# Patient Record
Sex: Female | Born: 1965
Health system: Southern US, Community
[De-identification: ages and names within clinical notes are randomized; demographics above are authoritative.]

## PROBLEM LIST (undated history)

## (undated) DIAGNOSIS — R202 Paresthesia of skin: Secondary | ICD-10-CM

## (undated) DIAGNOSIS — R519 Headache, unspecified: Secondary | ICD-10-CM

## (undated) DIAGNOSIS — E669 Obesity, unspecified: Secondary | ICD-10-CM

## (undated) DIAGNOSIS — T4145XA Adverse effect of unspecified anesthetic, initial encounter: Secondary | ICD-10-CM

## (undated) DIAGNOSIS — M1990 Unspecified osteoarthritis, unspecified site: Secondary | ICD-10-CM

## (undated) DIAGNOSIS — I10 Essential (primary) hypertension: Secondary | ICD-10-CM

## (undated) DIAGNOSIS — T8859XA Other complications of anesthesia, initial encounter: Secondary | ICD-10-CM

## (undated) DIAGNOSIS — J45909 Unspecified asthma, uncomplicated: Secondary | ICD-10-CM

## (undated) DIAGNOSIS — M25529 Pain in unspecified elbow: Secondary | ICD-10-CM

## (undated) DIAGNOSIS — R42 Dizziness and giddiness: Secondary | ICD-10-CM

## (undated) DIAGNOSIS — R51 Headache: Secondary | ICD-10-CM

## (undated) DIAGNOSIS — E559 Vitamin D deficiency, unspecified: Secondary | ICD-10-CM

## (undated) DIAGNOSIS — L80 Vitiligo: Secondary | ICD-10-CM

## (undated) DIAGNOSIS — K7290 Hepatic failure, unspecified without coma: Secondary | ICD-10-CM

## (undated) DIAGNOSIS — R112 Nausea with vomiting, unspecified: Secondary | ICD-10-CM

## (undated) DIAGNOSIS — Z972 Presence of dental prosthetic device (complete) (partial): Secondary | ICD-10-CM

## (undated) DIAGNOSIS — Z9889 Other specified postprocedural states: Secondary | ICD-10-CM

## (undated) DIAGNOSIS — F419 Anxiety disorder, unspecified: Secondary | ICD-10-CM

## (undated) DIAGNOSIS — K859 Acute pancreatitis without necrosis or infection, unspecified: Secondary | ICD-10-CM

## (undated) HISTORY — PX: MANDIBLE FRACTURE SURGERY: SHX706

## (undated) HISTORY — DX: Vitiligo: L80

## (undated) HISTORY — DX: Paresthesia of skin: R20.2

## (undated) HISTORY — PX: CHOLECYSTECTOMY: SHX55

## (undated) HISTORY — DX: Essential (primary) hypertension: I10

## (undated) HISTORY — DX: Vitamin D deficiency, unspecified: E55.9

## (undated) HISTORY — DX: Obesity, unspecified: E66.9

## (undated) HISTORY — PX: TRIGGER FINGER RELEASE: SHX641

## (undated) HISTORY — PX: KNEE SURGERY: SHX244

## (undated) HISTORY — DX: Hepatic failure, unspecified without coma: K72.90

## (undated) HISTORY — PX: TONSILLECTOMY: SUR1361

## (undated) HISTORY — PX: ABDOMINAL EXPLORATION SURGERY: SHX538

## (undated) HISTORY — DX: Unspecified asthma, uncomplicated: J45.909

## (undated) HISTORY — DX: Pain in unspecified elbow: M25.529

## (undated) HISTORY — DX: Acute pancreatitis without necrosis or infection, unspecified: K85.90

## (undated) HISTORY — PX: FOOT SURGERY: SHX648

## (undated) HISTORY — PX: NASAL SINUS SURGERY: SHX719

---
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
## 1983-12-13 HISTORY — PX: KNEE ARTHROSCOPY: SUR90

## 1990-12-12 HISTORY — PX: RECONSTRUCTION MANDIBLE / MAXILLA: SUR1083

## 1992-12-12 DIAGNOSIS — K7290 Hepatic failure, unspecified without coma: Secondary | ICD-10-CM

## 1992-12-12 HISTORY — DX: Hepatic failure, unspecified without coma: K72.90

## 2005-02-28 ENCOUNTER — Encounter: Admission: RE | Admit: 2005-02-28 | Discharge: 2005-05-23 | Payer: Self-pay | Admitting: Orthotics

## 2006-06-05 ENCOUNTER — Other Ambulatory Visit: Payer: Self-pay

## 2006-06-09 ENCOUNTER — Ambulatory Visit: Payer: Self-pay | Admitting: Podiatry

## 2009-12-15 ENCOUNTER — Encounter: Admission: RE | Admit: 2009-12-15 | Discharge: 2009-12-15 | Payer: Self-pay | Admitting: Family Medicine

## 2010-04-29 ENCOUNTER — Emergency Department: Payer: Self-pay | Admitting: Emergency Medicine

## 2010-04-30 ENCOUNTER — Emergency Department: Payer: Self-pay

## 2010-09-03 DIAGNOSIS — I10 Essential (primary) hypertension: Secondary | ICD-10-CM | POA: Insufficient documentation

## 2010-09-08 ENCOUNTER — Ambulatory Visit: Payer: Self-pay | Admitting: Unknown Physician Specialty

## 2010-09-09 ENCOUNTER — Inpatient Hospital Stay: Payer: Self-pay | Admitting: Obstetrics and Gynecology

## 2010-12-12 HISTORY — PX: NASAL SINUS SURGERY: SHX719

## 2011-01-05 ENCOUNTER — Ambulatory Visit: Payer: Self-pay | Admitting: Family Medicine

## 2011-02-21 ENCOUNTER — Other Ambulatory Visit: Payer: Self-pay | Admitting: Neurology

## 2011-02-21 DIAGNOSIS — F0781 Postconcussional syndrome: Secondary | ICD-10-CM

## 2011-02-21 DIAGNOSIS — S5400XA Injury of ulnar nerve at forearm level, unspecified arm, initial encounter: Secondary | ICD-10-CM

## 2011-02-21 DIAGNOSIS — M25419 Effusion, unspecified shoulder: Secondary | ICD-10-CM

## 2011-04-01 ENCOUNTER — Ambulatory Visit: Payer: Self-pay | Admitting: Neurology

## 2011-12-13 HISTORY — PX: ABDOMINAL HYSTERECTOMY: SHX81

## 2012-12-12 HISTORY — PX: ABDOMINAL HYSTERECTOMY: SHX81

## 2013-06-19 ENCOUNTER — Ambulatory Visit: Payer: Self-pay | Admitting: Family Medicine

## 2014-04-15 LAB — HM MAMMOGRAPHY: HM Mammogram: NORMAL

## 2014-04-25 LAB — HM PAP SMEAR: HM PAP: NORMAL

## 2014-05-22 LAB — LIPID PANEL
CHOLESTEROL: 168 mg/dL (ref 0–200)
HDL: 71 mg/dL — AB (ref 35–70)
LDL Cholesterol: 84 mg/dL
Triglycerides: 63 mg/dL (ref 40–160)

## 2014-10-08 LAB — HM COLONOSCOPY: HM COLON: NEGATIVE

## 2015-02-16 LAB — HEMOGLOBIN A1C: HEMOGLOBIN A1C: 6.1 % — AB (ref 4.0–6.0)

## 2015-03-31 ENCOUNTER — Encounter: Payer: Self-pay | Admitting: Family Medicine

## 2015-05-19 ENCOUNTER — Ambulatory Visit: Payer: Self-pay | Admitting: Family Medicine

## 2015-06-01 ENCOUNTER — Encounter: Payer: Self-pay | Admitting: Family Medicine

## 2015-06-01 DIAGNOSIS — J45991 Cough variant asthma: Secondary | ICD-10-CM | POA: Insufficient documentation

## 2015-06-01 DIAGNOSIS — E669 Obesity, unspecified: Secondary | ICD-10-CM | POA: Insufficient documentation

## 2015-06-01 DIAGNOSIS — Z780 Asymptomatic menopausal state: Secondary | ICD-10-CM | POA: Insufficient documentation

## 2015-06-01 DIAGNOSIS — L80 Vitiligo: Secondary | ICD-10-CM | POA: Insufficient documentation

## 2015-06-01 DIAGNOSIS — R413 Other amnesia: Secondary | ICD-10-CM | POA: Insufficient documentation

## 2015-06-01 DIAGNOSIS — G5620 Lesion of ulnar nerve, unspecified upper limb: Secondary | ICD-10-CM | POA: Insufficient documentation

## 2015-06-01 DIAGNOSIS — R229 Localized swelling, mass and lump, unspecified: Secondary | ICD-10-CM | POA: Insufficient documentation

## 2015-06-02 ENCOUNTER — Encounter: Payer: Self-pay | Admitting: Family Medicine

## 2015-06-04 ENCOUNTER — Encounter: Payer: Self-pay | Admitting: Family Medicine

## 2015-06-04 ENCOUNTER — Ambulatory Visit (INDEPENDENT_AMBULATORY_CARE_PROVIDER_SITE_OTHER): Payer: 59 | Admitting: Family Medicine

## 2015-06-04 VITALS — BP 126/86 | HR 102 | Temp 98.0°F | Resp 16 | Ht 64.0 in | Wt 192.1 lb

## 2015-06-04 DIAGNOSIS — E669 Obesity, unspecified: Secondary | ICD-10-CM

## 2015-06-04 DIAGNOSIS — I10 Essential (primary) hypertension: Secondary | ICD-10-CM

## 2015-06-04 DIAGNOSIS — J45909 Unspecified asthma, uncomplicated: Secondary | ICD-10-CM

## 2015-06-04 DIAGNOSIS — J45998 Other asthma: Secondary | ICD-10-CM | POA: Diagnosis not present

## 2015-06-04 MED ORDER — QSYMIA 11.25-69 MG PO CP24: 1.0000 | ORAL_CAPSULE | Freq: Every day | ORAL | Status: DC

## 2015-06-04 MED ORDER — TRIBENZOR 20-5-12.5 MG PO TABS: 1.0000 | ORAL_TABLET | Freq: Every day | ORAL | Status: DC

## 2015-06-04 NOTE — Progress Notes (Signed)
Name: Tammy Swanson   MRN: [id number]    DOB: 10-Apr-1966   Date:06/04/2015       Progress Note  Subjective  Chief Complaint  Chief Complaint  Patient presents with  . Obesity    dry mouth   . Hypertension    headaches  . Asthma    wheather,humidity    HPI  HTN: she has been taking medication and bp is at goal, denies side effects of medication.  Obesity : started on Qsymia June 2015, the weight was 210.19lbs, she is doing well on medication has lost over 5% of original weight and is still losing, it curbs her appetite, only side effect is dry mouth but tolerating it well otherwise and wants to continue on medication  Asthma: using Qvar lately because of increase in humidity, some cough at night but less than twice weekly, no wheezing, not using rescue inhaler lately.   Patient Active Problem List   Diagnosis Date Noted  . Bad memory 06/01/2015  . Menopause 06/01/2015  . Asthma, mild 06/01/2015  . Obesity (BMI 30-39.9) 06/01/2015  . Nodule, subcutaneous 06/01/2015  . Lesion of ulnar nerve 06/01/2015  . Vitiligo 06/01/2015  . Benign hypertension 09/03/2010    Past Surgical History  Procedure Laterality Date  . Abdominal hysterectomy  2013  . Knee surgery Left   . Foot surgery    . Mandible fracture surgery    . Tonsillectomy    . Nasal sinus surgery    . Trigger finger release    . Cholecystectomy      Family History  Problem Relation Age of Onset  . Hypertension Mother   . HIV/AIDS Mother   . Cancer Father   . Hypertension Sister     History   Social History  . Marital Status: Married    Spouse Name: N/A  . Number of Children: N/A  . Years of Education: N/A   Occupational History  . Not on file.   Social History Main Topics  . Smoking status: Never Smoker   . Smokeless tobacco: Never Used  . Alcohol Use: No  . Drug Use: No  . Sexual Activity: Yes   Other Topics Concern  . Not on file   Social History Narrative  . No narrative on file      Current outpatient prescriptions:  .  albuterol (PROAIR HFA) 108 (90 BASE) MCG/ACT inhaler, Inhale 2 puffs into the lungs every 6 (six) hours as needed., Disp: , Rfl:  .  beclomethasone (QVAR) 40 MCG/ACT inhaler, Inhale 1 puff into the lungs as needed., Disp: , Rfl:  .  meloxicam (MOBIC) 15 MG tablet, Take 1 tablet by mouth as needed., Disp: , Rfl:  .  montelukast (SINGULAIR) 10 MG tablet, Take 1 tablet by mouth daily., Disp: , Rfl:  .  QSYMIA 11.25-69 MG CP24, Take 1 tablet by mouth daily., Disp: 30 capsule, Rfl: 2 .  tacrolimus (PROTOPIC) 0.1 % ointment, Apply 1 application topically daily., Disp: , Rfl:  .  TRIBENZOR 20-5-12.5 MG TABS, Take 1 tablet by mouth daily., Disp: 90 tablet, Rfl: 1  Allergies  Allergen Reactions  . Latex Anaphylaxis  . Hydrocodone-Acetaminophen     hives     ROS  Constitutional: Negative for fever losing weight as desired Respiratory: Negative for cough and shortness of breath.   Cardiovascular: Negative for chest pain or palpitations.  Gastrointestinal: Negative for abdominal pain, no bowel changes.  Musculoskeletal: Negative for gait problem or joint swelling.  Skin: Negative for rash.  Neurological: Negative for dizziness or headache.  No other specific complaints in a complete review of systems (except as listed in HPI above).  Objective  Filed Vitals:   06/04/15 1111  BP: 126/86  Pulse: 102  Temp: 98 F (36.7 C)  TempSrc: Oral  Resp: 16  Height: 5\' 4"  (1.626 m)  Weight: 192 lb 1.6 oz (87.136 kg)  SpO2: 99%    Body mass index is 32.96 kg/(m^2).  Physical Exam   Constitutional: Patient appears well-developed and well-nourished. No distress.  HENT: Head: Normocephalic and atraumatic.  Nose: Nose normal. Mouth/Throat: Oropharynx is clear and moist. No oropharyngeal exudate.  Eyes: Conjunctivae and EOM are normal. Pupils are equal, round, and reactive to light. No scleral icterus.  Neck: Normal range of motion. Neck supple. No JVD  present. No thyromegaly present.  Cardiovascular: Normal rate, regular rhythm and normal heart sounds.  No murmur heard. No BLE edema. Pulmonary/Chest: Effort normal and breath sounds normal. No respiratory distress. Abdominal: Soft. Bowel sounds are normal, no distension. There is no tenderness. no masses Musculoskeletal: Normal range of motion, no joint effusions. No gross deformities Neurological: he is alert and oriented to person, place, and time. No cranial nerve deficit. Coordination, balance, strength, speech and gait are normal.  Skin: Skin is warm and dry. No rash noted. No erythema.  Psychiatric: Patient has a normal mood and affect. behavior is normal. Judgment and thought content normal. PHQ2/9: Depression screen PHQ 2/9 06/04/2015  Decreased Interest 0  Down, Depressed, Hopeless 0  PHQ - 2 Score 0     Fall Risk: Fall Risk  06/04/2015  Falls in the past year? Yes  Number falls in past yr: 1  Injury with Fall? No    Assessment & Plan  1. Benign hypertension At goal, continue medication  - TRIBENZOR 20-5-12.5 MG TABS; Take 1 tablet by mouth daily.  Dispense: 90 tablet; Refill: 1  2. Asthma, mild States that she has noticed using Qvar more often because of the heat and humidity.   3. Obesity (BMI 30-39.9) Doing well, lost over 5% of her weight over the past year. Still losing weight on medication - QSYMIA 11.25-69 MG CP24; Take 1 tablet by mouth daily.  Dispense: 30 capsule; Refill: 2

## 2015-08-04 ENCOUNTER — Ambulatory Visit: Payer: 59 | Admitting: Family Medicine

## 2015-09-10 ENCOUNTER — Ambulatory Visit (INDEPENDENT_AMBULATORY_CARE_PROVIDER_SITE_OTHER): Payer: Managed Care, Other (non HMO) | Admitting: Family Medicine

## 2015-09-10 ENCOUNTER — Encounter: Payer: Self-pay | Admitting: Family Medicine

## 2015-09-10 VITALS — BP 122/88 | HR 97 | Temp 97.7°F | Resp 14 | Ht 64.0 in | Wt 195.4 lb

## 2015-09-10 DIAGNOSIS — L309 Dermatitis, unspecified: Secondary | ICD-10-CM | POA: Diagnosis not present

## 2015-09-10 DIAGNOSIS — R42 Dizziness and giddiness: Secondary | ICD-10-CM | POA: Diagnosis not present

## 2015-09-10 DIAGNOSIS — E669 Obesity, unspecified: Secondary | ICD-10-CM | POA: Diagnosis not present

## 2015-09-10 DIAGNOSIS — J45991 Cough variant asthma: Secondary | ICD-10-CM | POA: Diagnosis not present

## 2015-09-10 DIAGNOSIS — Z13220 Encounter for screening for lipoid disorders: Secondary | ICD-10-CM | POA: Diagnosis not present

## 2015-09-10 DIAGNOSIS — G43009 Migraine without aura, not intractable, without status migrainosus: Secondary | ICD-10-CM

## 2015-09-10 DIAGNOSIS — Z23 Encounter for immunization: Secondary | ICD-10-CM | POA: Diagnosis not present

## 2015-09-10 DIAGNOSIS — I10 Essential (primary) hypertension: Secondary | ICD-10-CM

## 2015-09-10 MED ORDER — TACROLIMUS 0.1 % EX OINT: 1.0000 "application " | TOPICAL_OINTMENT | Freq: Every day | CUTANEOUS | Status: DC

## 2015-09-10 MED ORDER — PHENTERMINE-TOPIRAMATE ER 7.5-46 MG PO CP24: 30.0000 | ORAL_CAPSULE | Freq: Every day | ORAL | Status: DC

## 2015-09-10 MED ORDER — AMITRIPTYLINE HCL 25 MG PO TABS: 25.0000 mg | ORAL_TABLET | Freq: Every day | ORAL | Status: DC

## 2015-09-10 MED ORDER — BECLOMETHASONE DIPROPIONATE 40 MCG/ACT IN AERS: 1.0000 | INHALATION_SPRAY | RESPIRATORY_TRACT | Status: DC | PRN

## 2015-09-10 MED ORDER — TRIBENZOR 20-5-12.5 MG PO TABS: 1.0000 | ORAL_TABLET | Freq: Every day | ORAL | Status: DC

## 2015-09-10 NOTE — Progress Notes (Signed)
Name: Tammy Swanson   MRN: [id number]    DOB: 04-30-1966   Date:09/10/2015       Progress Note  Subjective  Chief Complaint  Chief Complaint  Patient presents with  . Medication Refill    follow-up  . Hypertension    headaches and dizziness onset 1 week ago with nausea  . Obesity    needs refill on Qysmia has been out since husband switched to new ins.  . Asthma    HPI  HTN: she has been taking medication and bp is at goal, denies side effects of medication. No chest pain or palpitation   Obesity : started on Qsymia June 2015, the weight was 210.19lbs, she is doing well on medication has lost over 5% of original weight  it curbs her appetite, only side effect is dry mouth. Her husband changed jobs and switched insurance, she ran out of Qysmia because of that 3 weeks ago, but wants to resume medication, weight is up by only 3 lbs.   Asthma: symptoms are now improved with Qvar daily, used to cough multiple times daily, but currently less frequent, occasional SOB but no wheezing   09/10/15 1609  Asthma History  Symptoms >2 days/week  Nighttime Awakenings 0-2/month  Asthma interference with normal activity No limitations  SABA use (not for EIB) 0-2 days/wk  Risk: Exacerbations requiring oral systemic steroids 0-1 / year  Asthma Severity Moderate Persistent    Migraine headaches: she has a history of migraines, and is under a lot of stress lately. Husband switched jobs and works third shift now, daughter is not doing well emotionally  She is also under stress at work . Last week at work she developed dizziness, nausea and headache that lasted over one day. She states having episodes every couple of days.  Pain is described as throbbing, it can be a 7/10 and feels like pulling her head off would make her feel better. She states improves when she takes a nap   Patient Active Problem List   Diagnosis Date Noted  . Migraine without aura and without status migrainosus, not  intractable 09/10/2015  . Eczema 09/10/2015  . Bad memory 06/01/2015  . Menopause 06/01/2015  . Cough variant asthma 06/01/2015  . Obesity (BMI 30-39.9) 06/01/2015  . Nodule, subcutaneous 06/01/2015  . Lesion of ulnar nerve 06/01/2015  . Vitiligo 06/01/2015  . Benign hypertension 09/03/2010    Past Surgical History  Procedure Laterality Date  . Abdominal hysterectomy  2013  . Knee surgery Left   . Foot surgery    . Mandible fracture surgery    . Tonsillectomy    . Nasal sinus surgery    . Trigger finger release    . Cholecystectomy      Family History  Problem Relation Age of Onset  . Hypertension Mother   . HIV/AIDS Mother   . Cancer Father   . Hypertension Sister     Social History   Social History  . Marital Status: Married    Spouse Name: N/A  . Number of Children: N/A  . Years of Education: N/A   Occupational History  . Not on file.   Social History Main Topics  . Smoking status: Never Smoker   . Smokeless tobacco: Never Used  . Alcohol Use: No  . Drug Use: No  . Sexual Activity: Yes   Other Topics Concern  . Not on file   Social History Narrative     Current outpatient prescriptions:  .  albuterol (PROAIR HFA) 108 (90 BASE) MCG/ACT inhaler, Inhale 2 puffs into the lungs every 6 (six) hours as needed., Disp: , Rfl:  .  beclomethasone (QVAR) 40 MCG/ACT inhaler, Inhale 1 puff into the lungs as needed., Disp: 3 Inhaler, Rfl: 1 .  montelukast (SINGULAIR) 10 MG tablet, Take 1 tablet by mouth daily., Disp: , Rfl:  .  tacrolimus (PROTOPIC) 0.1 % ointment, Apply 1 application topically daily., Disp: 300 g, Rfl: 1 .  TRIBENZOR 20-5-12.5 MG TABS, Take 1 tablet by mouth daily., Disp: 90 tablet, Rfl: 1 .  amitriptyline (ELAVIL) 25 MG tablet, Take 1 tablet (25 mg total) by mouth at bedtime., Disp: 30 tablet, Rfl: 0 .  Phentermine-Topiramate (QSYMIA) 7.5-46 MG CP24, Take 30 tablets by mouth daily., Disp: 30 capsule, Rfl: 0  Allergies  Allergen Reactions  .  Latex Anaphylaxis  . Hydrocodone-Acetaminophen     hives     ROS  Constitutional: Negative for fever or weight change.  Respiratory: Negative for cough and shortness of breath.   Cardiovascular: Negative for chest pain or palpitations.  Gastrointestinal: Negative for abdominal pain, no bowel changes.  Musculoskeletal: Negative for gait problem or joint swelling.  Skin: Negative for rash.  Neurological: Positive  for dizziness and  headache.  No other specific complaints in a complete review of systems (except as listed in HPI above).  Objective  Filed Vitals:   09/10/15 1540  BP: 122/88  Pulse: 97  Temp: 97.7 F (36.5 C)  TempSrc: Oral  Resp: 14  Height: 5\' 4"  (1.626 m)  Weight: 195 lb 6.4 oz (88.633 kg)  SpO2: 97%    Body mass index is 33.52 kg/(m^2).  Physical Exam  Constitutional: Patient appears well-developed and well-nourished. Obese No distress.  HEENT: head atraumatic, normocephalic, pupils equal and reactive to light, neck supple, throat within normal limits Cardiovascular: Normal rate, regular rhythm and normal heart sounds.  No murmur heard. No BLE edema. Pulmonary/Chest: Effort normal and breath sounds normal. No respiratory distress. Abdominal: Soft.  There is no tenderness. Psychiatric: Patient has a normal mood and affect. behavior is normal. Judgment and thought content normal. Neurological: normal exam today, no focal finding.   PHQ2/9: Depression screen [facility] 2/9 09/10/2015 06/04/2015  Decreased Interest 0 0  Down, Depressed, Hopeless 0 0  PHQ - 2 Score 0 0     Fall Risk: Fall Risk  09/10/2015 06/04/2015  Falls in the past year? No Yes  Number falls in past yr: - 1  Injury with Fall? - No      Functional Status Survey: Is the patient deaf or have difficulty hearing?: No Does the patient have difficulty seeing, even when wearing glasses/contacts?: Yes (glasses) Does the patient have difficulty concentrating, remembering, or making decisions?:  No Does the patient have difficulty walking or climbing stairs?: No Does the patient have difficulty dressing or bathing?: No Does the patient have difficulty doing errands alone such as visiting a doctor's office or shopping?: No   Assessment & Plan  1. Benign hypertension  At goal, continue medication  - Comprehensive metabolic panel - Thyroid Panel With TSH - TRIBENZOR 20-5-12.5 MG TABS; Take 1 tablet by mouth daily.  Dispense: 90 tablet; Refill: 1  2. Needs flu shot  - Flu Vaccine QUAD 36+ mos PF IM (Fluarix & Fluzone Quad PF)  3. Cough variant asthma  Doing better on medication  - beclomethasone (QVAR) 40 MCG/ACT inhaler; Inhale 1 puff into the lungs as needed.  Dispense: 3 Inhaler; Refill: 1  4. Obesity (BMI 30-39.9)  We will resume lower dose of Qysmia, and follow up in one month - Phentermine-Topiramate (QSYMIA) 7.5-46 MG CP24; Take 30 tablets by mouth daily.  Dispense: 30 capsule; Refill: 0  5. Dizziness   likely from Migraine, but she also states gets worse with movement of head, if not better refer to ENT - CBC with Differential/Platelet  6. Lipid screening  - Lipid panel  7. Migraine without aura and without status migrainosus, not intractable  - amitriptyline (ELAVIL) 25 MG tablet; Take 1 tablet (25 mg total) by mouth at bedtime.  Dispense: 30 tablet; Refill: 0  8. Eczema  - tacrolimus (PROTOPIC) 0.1 % ointment; Apply 1 application topically daily.  Dispense: 300 g; Refill: 1

## 2015-09-10 NOTE — Progress Notes (Signed)
   09/10/15 1609  Asthma History  Symptoms >2 days/week  Nighttime Awakenings 0-2/month  Asthma interference with normal activity No limitations  SABA use (not for EIB) 0-2 days/wk  Risk: Exacerbations requiring oral systemic steroids 0-1 / year  Asthma Severity Moderate Persistent

## 2015-09-11 LAB — CBC WITH DIFFERENTIAL/PLATELET
BASOS ABS: 0 10*3/uL (ref 0.0–0.2)
Basos: 0 %
EOS (ABSOLUTE): 0.2 10*3/uL (ref 0.0–0.4)
Eos: 2 %
Hematocrit: 42.4 % (ref 34.0–46.6)
Hemoglobin: 14.3 g/dL (ref 11.1–15.9)
IMMATURE GRANS (ABS): 0 10*3/uL (ref 0.0–0.1)
IMMATURE GRANULOCYTES: 0 %
LYMPHS: 20 %
Lymphocytes Absolute: 1.5 10*3/uL (ref 0.7–3.1)
MCH: 27.6 pg (ref 26.6–33.0)
MCHC: 33.7 g/dL (ref 31.5–35.7)
MCV: 82 fL (ref 79–97)
Monocytes Absolute: 0.6 10*3/uL (ref 0.1–0.9)
Monocytes: 8 %
NEUTROS PCT: 70 %
Neutrophils Absolute: 5 10*3/uL (ref 1.4–7.0)
PLATELETS: 349 10*3/uL (ref 150–379)
RBC: 5.18 x10E6/uL (ref 3.77–5.28)
RDW: 13.5 % (ref 12.3–15.4)
WBC: 7.3 10*3/uL (ref 3.4–10.8)

## 2015-09-12 LAB — COMPREHENSIVE METABOLIC PANEL
A/G RATIO: 1.3 (ref 1.1–2.5)
ALT: 21 IU/L (ref 0–32)
AST: 19 IU/L (ref 0–40)
Albumin: 4.3 g/dL (ref 3.5–5.5)
Alkaline Phosphatase: 101 IU/L (ref 39–117)
BILIRUBIN TOTAL: 0.4 mg/dL (ref 0.0–1.2)
BUN/Creatinine Ratio: 12 (ref 9–23)
BUN: 11 mg/dL (ref 6–24)
CALCIUM: 10 mg/dL (ref 8.7–10.2)
CHLORIDE: 96 mmol/L — AB (ref 97–108)
CO2: 25 mmol/L (ref 18–29)
Creatinine, Ser: 0.92 mg/dL (ref 0.57–1.00)
GFR calc Af Amer: 83 mL/min/{1.73_m2} (ref >59)
GFR, EST NON AFRICAN AMERICAN: 72 mL/min/{1.73_m2} (ref >59)
GLOBULIN, TOTAL: 3.3 g/dL (ref 1.5–4.5)
Glucose: 96 mg/dL (ref 65–99)
POTASSIUM: 4.9 mmol/L (ref 3.5–5.2)
SODIUM: 137 mmol/L (ref 134–144)
Total Protein: 7.6 g/dL (ref 6.0–8.5)

## 2015-09-12 LAB — LIPID PANEL
CHOL/HDL RATIO: 2.4 ratio (ref 0.0–4.4)
Cholesterol, Total: 163 mg/dL (ref 100–199)
HDL: 67 mg/dL (ref >39)
LDL Calculated: 82 mg/dL (ref 0–99)
TRIGLYCERIDES: 72 mg/dL (ref 0–149)
VLDL Cholesterol Cal: 14 mg/dL (ref 5–40)

## 2015-09-12 LAB — THYROID PANEL WITH TSH
FREE THYROXINE INDEX: 1.9 (ref 1.2–4.9)
T3 UPTAKE RATIO: 30 % (ref 24–39)
T4, Total: 6.3 ug/dL (ref 4.5–12.0)
TSH: 1.8 u[IU]/mL (ref 0.450–4.500)

## 2015-09-15 NOTE — Progress Notes (Signed)
Pt.notified

## 2015-10-09 ENCOUNTER — Encounter: Payer: Self-pay | Admitting: Family Medicine

## 2015-10-09 ENCOUNTER — Ambulatory Visit (INDEPENDENT_AMBULATORY_CARE_PROVIDER_SITE_OTHER): Payer: Managed Care, Other (non HMO) | Admitting: Family Medicine

## 2015-10-09 VITALS — BP 138/88 | HR 88 | Temp 98.2°F | Resp 16 | Ht 64.0 in | Wt 200.3 lb

## 2015-10-09 DIAGNOSIS — G43109 Migraine with aura, not intractable, without status migrainosus: Secondary | ICD-10-CM

## 2015-10-09 DIAGNOSIS — E669 Obesity, unspecified: Secondary | ICD-10-CM

## 2015-10-09 MED ORDER — SUMATRIPTAN SUCCINATE 100 MG PO TABS: 100.0000 mg | ORAL_TABLET | ORAL | Status: DC | PRN

## 2015-10-09 MED ORDER — AMITRIPTYLINE HCL 10 MG PO TABS: 10.0000 mg | ORAL_TABLET | Freq: Every day | ORAL | Status: DC

## 2015-10-09 NOTE — Progress Notes (Signed)
Name: Tammy Swanson   MRN: [id number]    DOB: 1966/07/27   Date:10/09/2015       Progress Note  Subjective  Chief Complaint  Chief Complaint  Patient presents with  . Follow-up    1 month f/u  . Migraine    States the amitriptylin kept her in a fog and has stopped  . Obesity    never started due to ins not covering.  Needs prior auth. will talk to shelly    HPI      Migraine headaches: she was seen about one month ago because of symptoms of migraine. Described dizziness, headaches  . We started her on Elavil 25 mg but she felt very groggy the following day, but not headaches occurred while on the medication. She had to stop medication because of side effects, and had two episode since she stopped taking it.  She has aura, she states she noticed that she develops a flushing sensation that goes from chest to her face, followed by dizziness and about 45 minutes later pounding, throbbing headache associated with nausea.   Obesity: insurance denied Qsymia, waiting on PA  Patient Active Problem List   Diagnosis Date Noted  . Migraine without aura and without status migrainosus, not intractable 09/10/2015  . Eczema 09/10/2015  . Bad memory 06/01/2015  . Menopause 06/01/2015  . Cough variant asthma 06/01/2015  . Obesity (BMI 30-39.9) 06/01/2015  . Nodule, subcutaneous 06/01/2015  . Lesion of ulnar nerve 06/01/2015  . Vitiligo 06/01/2015  . Benign hypertension 09/03/2010    Past Surgical History  Procedure Laterality Date  . Abdominal hysterectomy  2013  . Knee surgery Left   . Foot surgery    . Mandible fracture surgery    . Tonsillectomy    . Nasal sinus surgery    . Trigger finger release    . Cholecystectomy      Family History  Problem Relation Age of Onset  . Hypertension Mother   . HIV/AIDS Mother   . Cancer Father   . Hypertension Sister     Social History   Social History  . Marital Status: Married    Spouse Name: N/A  . Number of Children: N/A   . Years of Education: N/A   Occupational History  . Not on file.   Social History Main Topics  . Smoking status: Never Smoker   . Smokeless tobacco: Never Used  . Alcohol Use: No  . Drug Use: No  . Sexual Activity: Yes   Other Topics Concern  . Not on file   Social History Narrative     Current outpatient prescriptions:  .  albuterol (PROAIR HFA) 108 (90 BASE) MCG/ACT inhaler, Inhale 2 puffs into the lungs every 6 (six) hours as needed., Disp: , Rfl:  .  beclomethasone (QVAR) 40 MCG/ACT inhaler, Inhale 1 puff into the lungs as needed., Disp: 3 Inhaler, Rfl: 1 .  montelukast (SINGULAIR) 10 MG tablet, Take 1 tablet by mouth daily., Disp: , Rfl:  .  Phentermine-Topiramate (QSYMIA) 7.5-46 MG CP24, Take 30 tablets by mouth daily., Disp: 30 capsule, Rfl: 0 .  tacrolimus (PROTOPIC) 0.1 % ointment, Apply 1 application topically daily., Disp: 300 g, Rfl: 1 .  TRIBENZOR 20-5-12.5 MG TABS, Take 1 tablet by mouth daily., Disp: 90 tablet, Rfl: 1  Allergies  Allergen Reactions  . Latex Anaphylaxis  . Hydrocodone-Acetaminophen     hives     ROS  Ten systems reviewed and is negative except as  mentioned in HPI   Objective  Filed Vitals:   10/09/15 1611  BP: 138/88  Pulse: 88  Temp: 98.2 F (36.8 C)  TempSrc: Oral  Resp: 16  Height: 5\' 4"  (1.626 m)  Weight: 200 lb 4.8 oz (90.855 kg)  SpO2: 96%    Body mass index is 34.36 kg/(m^2).  Physical Exam  Constitutional: Patient appears well-developed and well-nourished. Obese  No distress.  HEENT: head atraumatic, normocephalic, pupils equal and reactive to light, eneck supple, throat within normal limits Cardiovascular: Normal rate, regular rhythm and normal heart sounds.  No murmur heard. No BLE edema. Pulmonary/Chest: Effort normal and breath sounds normal. No respiratory distress. Abdominal: Soft.  There is no tenderness. Psychiatric: Patient has a normal mood and affect. behavior is normal. Judgment and thought content  normal. Neurological: normal , AAA, normal sensation   Recent Results (from the past 2160 hour(s))  CBC with Differential/Platelet     Status: None   Collection Time: 09/11/15  9:11 AM  Result Value Ref Range   WBC 7.3 3.4 - 10.8 x10E3/uL   RBC 5.18 3.77 - 5.28 x10E6/uL   Hemoglobin 14.3 11.1 - 15.9 g/dL   Hematocrit 42.4 34.0 - 46.6 %   MCV 82 79 - 97 fL   MCH 27.6 26.6 - 33.0 pg   MCHC 33.7 31.5 - 35.7 g/dL   RDW 13.5 12.3 - 15.4 %   Platelets 349 150 - 379 x10E3/uL   Neutrophils 70 %   Lymphs 20 %   Monocytes 8 %   Eos 2 %   Basos 0 %   Neutrophils Absolute 5.0 1.4 - 7.0 x10E3/uL   Lymphocytes Absolute 1.5 0.7 - 3.1 x10E3/uL   Monocytes Absolute 0.6 0.1 - 0.9 x10E3/uL   EOS (ABSOLUTE) 0.2 0.0 - 0.4 x10E3/uL   Basophils Absolute 0.0 0.0 - 0.2 x10E3/uL   Immature Granulocytes 0 %   Immature Grans (Abs) 0.0 0.0 - 0.1 x10E3/uL  Comprehensive metabolic panel     Status: Abnormal   Collection Time: 09/11/15  9:11 AM  Result Value Ref Range   Glucose 96 65 - 99 mg/dL   BUN 11 6 - 24 mg/dL   Creatinine, Ser 0.92 0.57 - 1.00 mg/dL   GFR calc non Af Amer 72 >59 mL/min/1.73   GFR calc Af Amer 83 >59 mL/min/1.73   BUN/Creatinine Ratio 12 9 - 23   Sodium 137 134 - 144 mmol/L   Potassium 4.9 3.5 - 5.2 mmol/L   Chloride 96 (L) 97 - 108 mmol/L   CO2 25 18 - 29 mmol/L   Calcium 10.0 8.7 - 10.2 mg/dL   Total Protein 7.6 6.0 - 8.5 g/dL   Albumin 4.3 3.5 - 5.5 g/dL   Globulin, Total 3.3 1.5 - 4.5 g/dL   Albumin/Globulin Ratio 1.3 1.1 - 2.5   Bilirubin Total 0.4 0.0 - 1.2 mg/dL   Alkaline Phosphatase 101 39 - 117 IU/L   AST 19 0 - 40 IU/L   ALT 21 0 - 32 IU/L  Lipid panel     Status: None   Collection Time: 09/11/15  9:11 AM  Result Value Ref Range   Cholesterol, Total 163 100 - 199 mg/dL   Triglycerides 72 0 - 149 mg/dL   HDL 67 >39 mg/dL    Comment: According to ATP-III Guidelines, HDL-C >59 mg/dL is considered a negative risk factor for CHD.    VLDL Cholesterol Cal 14 5 -  40 mg/dL   LDL Calculated 82 0 -  99 mg/dL   Chol/HDL Ratio 2.4 0.0 - 4.4 ratio units    Comment:                                   T. Chol/HDL Ratio                                             Men  Women                               1/2 Avg.Risk  3.4    3.3                                   Avg.Risk  5.0    4.4                                2X Avg.Risk  9.6    7.1                                3X Avg.Risk 23.4   11.0   Thyroid Panel With TSH     Status: None   Collection Time: 09/11/15  9:11 AM  Result Value Ref Range   TSH 1.800 0.450 - 4.500 uIU/mL   T4, Total 6.3 4.5 - 12.0 ug/dL   T3 Uptake Ratio 30 24 - 39 %   Free Thyroxine Index 1.9 1.2 - 4.9    PHQ2/9: Depression screen [facility] 2/9 09/10/2015 06/04/2015  Decreased Interest 0 0  Down, Depressed, Hopeless 0 0  PHQ - 2 Score 0 0     Fall Risk: Fall Risk  09/10/2015 06/04/2015  Falls in the past year? No Yes  Number falls in past yr: - 1  Injury with Fall? - No     Assessment & Plan   1. Migraine with aura and without status migrainosus, not intractable  We will decrease dose of Elavil and take it earlier in the evening, also explained that since she has aura, she needs to take Imitrex at onset of aura to abort the migraine episode.  - SUMAtriptan (IMITREX) 100 MG tablet; Take 1 tablet (100 mg total) by mouth every 2 (two) hours as needed for migraine. May repeat in 2 hours if headache persists or recurs.  Dispense: 10 tablet; Refill: 0 - amitriptyline (ELAVIL) 10 MG tablet; Take 1 tablet (10 mg total) by mouth at bedtime.  Dispense: 30 tablet; Refill: 0  2. Obesity (BMI 30-39.9)   pending PA approval

## 2015-10-14 ENCOUNTER — Telehealth: Payer: Self-pay

## 2015-10-14 DIAGNOSIS — E669 Obesity, unspecified: Secondary | ICD-10-CM

## 2015-10-14 NOTE — Telephone Encounter (Signed)
Tried  Prior auth for [employer] her insurance will not cover she must first try all of the following: contrave,belviq,saxenda.  Please write for one of these

## 2015-10-14 NOTE — Telephone Encounter (Signed)
She has been on Qsymia for years, I have not discussed other options with her. Please call her tomorrow and I can discuss options with her over the phone before sending a new prescription, since it was denied by insurance

## 2015-10-15 MED ORDER — LORCASERIN HCL 10 MG PO TABS: 1.0000 | ORAL_TABLET | Freq: Two times a day (BID) | ORAL | Status: DC

## 2015-10-15 NOTE — Telephone Encounter (Signed)
Discussed all other optiosns for weight loss medications including Belviq,  Saxenda and Contrave. Discussed risk and benefits of each of them. She wants to try Belviq first

## 2015-10-15 NOTE — Telephone Encounter (Signed)
Patient taking with patient and changing

## 2016-05-17 ENCOUNTER — Other Ambulatory Visit: Payer: Self-pay | Admitting: Family Medicine

## 2016-05-17 DIAGNOSIS — Z1231 Encounter for screening mammogram for malignant neoplasm of breast: Secondary | ICD-10-CM

## 2016-05-26 ENCOUNTER — Ambulatory Visit
Admission: RE | Admit: 2016-05-26 | Discharge: 2016-05-26 | Disposition: A | Discharge status: Home / Self Care | Payer: Managed Care, Other (non HMO) | Source: Ambulatory Visit | Attending: Family Medicine | Admitting: Family Medicine

## 2016-05-26 DIAGNOSIS — Z1231 Encounter for screening mammogram for malignant neoplasm of breast: Secondary | ICD-10-CM

## 2016-08-24 ENCOUNTER — Encounter: Payer: Self-pay | Admitting: Family Medicine

## 2016-08-24 ENCOUNTER — Ambulatory Visit (INDEPENDENT_AMBULATORY_CARE_PROVIDER_SITE_OTHER): Payer: Managed Care, Other (non HMO) | Admitting: Family Medicine

## 2016-08-24 VITALS — BP 158/100 | HR 93 | Temp 98.4°F | Resp 16 | Ht 62.0 in | Wt 207.0 lb

## 2016-08-24 DIAGNOSIS — M7541 Impingement syndrome of right shoulder: Secondary | ICD-10-CM | POA: Diagnosis not present

## 2016-08-24 DIAGNOSIS — I10 Essential (primary) hypertension: Secondary | ICD-10-CM

## 2016-08-24 DIAGNOSIS — Z23 Encounter for immunization: Secondary | ICD-10-CM | POA: Diagnosis not present

## 2016-08-24 DIAGNOSIS — J4520 Mild intermittent asthma, uncomplicated: Secondary | ICD-10-CM | POA: Insufficient documentation

## 2016-08-24 DIAGNOSIS — Z8719 Personal history of other diseases of the digestive system: Secondary | ICD-10-CM | POA: Insufficient documentation

## 2016-08-24 MED ORDER — PREDNISONE 10 MG (48) PO TBPK: ORAL_TABLET | Freq: Every day | ORAL | 0 refills | Status: DC

## 2016-08-24 NOTE — Progress Notes (Signed)
Name: Tammy Swanson   MRN: [id number]    DOB: 1966/03/18   Date:08/24/2016       Progress Note  Subjective  Chief Complaint  Chief Complaint  Patient presents with  . Hand Pain    Right hand goes numb, cold and tingling. Patient has been to Dr. Tamala Julian in the past to release her trigger finger about 2-3 years ago but now her hand is much worst. Patient states it will fall asleep and will hurt her around her thumb area. Patient states it was just her right hand but now has it in her left hand. Patient has been achy and wants a blood panel drawn for symptoms.     HPI  Paresthesia or right arm: she states symptoms started months ago, she states numbness goes in segments, from right upper arm, to lower upper arm and at times the hand. She denies neck problems, or decrease of ROM, however has pain when abduction and internally rotating her right shoulder. She has difficulty typing when hand is numb. Symptoms are worse when in bed at night and is affecting her sleep.   HTN: she has not been taking bp medication over the past 2 days, and bp is high today. She denies chest pain or palpitation  Patient Active Problem List   Diagnosis Date Noted  . Hypertension 08/24/2016  . Asthma, mild intermittent 08/24/2016  . History of liver failure 08/24/2016    Past Surgical History:  Procedure Laterality Date  . ABDOMINAL EXPLORATION SURGERY     x3  . ABDOMINAL HYSTERECTOMY  2014  . CHOLECYSTECTOMY    . FOOT SURGERY Left 2007/2009   Dr. Elvina Mattes  . KNEE ARTHROSCOPY Left 1985  . RECONSTRUCTION MANDIBLE / MAXILLA  1992  . TONSILLECTOMY  1981    Family History  Problem Relation Age of Onset  . Hypertension Mother   . Cancer Mother     Breast   . Cancer Father   . Hypertension Sister   . Cancer Maternal Aunt     3 sisters with Breast Cancer  . Cancer Paternal Aunt   . Cancer Paternal Uncle   . Hypertension Maternal Grandmother   . Hypertension Maternal Grandfather     Social History    Social History  . Marital status: Married    Spouse name: N/A  . Number of children: N/A  . Years of education: N/A   Occupational History  . Not on file.   Social History Main Topics  . Smoking status: Never Smoker  . Smokeless tobacco: Never Used  . Alcohol use Yes     Comment: occasionally  . Drug use: No  . Sexual activity: Yes    Partners: Male     Comment: Hysterectomy   Other Topics Concern  . Not on file   Social History Narrative  . No narrative on file     Current Outpatient Prescriptions:  .  albuterol (VENTOLIN HFA) 108 (90 Base) MCG/ACT inhaler, Inhale 2 puffs into the lungs every 6 (six) hours as needed for wheezing or shortness of breath., Disp: , Rfl:  .  beclomethasone (QVAR) 40 MCG/ACT inhaler, Inhale 2 puffs into the lungs 2 (two) times daily., Disp: , Rfl:  .  Olmesartan-Amlodipine-HCTZ (TRIBENZOR) 20-5-12.5 MG TABS, Take 1 tablet by mouth daily., Disp: , Rfl:  .  tacrolimus (PROTOPIC) 0.1 % ointment, Apply 1 each topically daily., Disp: , Rfl:  .  predniSONE (STERAPRED UNI-PAK 48 TAB) 10 MG (48) TBPK tablet, Take by  mouth daily., Disp: 48 tablet, Rfl: 0  Allergies  Allergen Reactions  . Vicodin [Hydrocodone-Acetaminophen] Hives  . Latex Hives and Rash     ROS  Ten systems reviewed and is negative except as mentioned in HPI   Objective  Vitals:   08/24/16 1436  BP: (!) 158/100  Pulse: 93  Resp: 16  Temp: 98.4 F (36.9 C)  TempSrc: Oral  SpO2: 97%  Weight: 207 lb (93.9 kg)  Height: 5\' 2"  (1.575 m)    Body mass index is 37.86 kg/m.  Physical Exam  Constitutional: Patient appears well-developed and well-nourished. Obese No distress.  HEENT: head atraumatic, normocephalic, pupils equal and reactive to light,  neck supple, throat within normal limits Cardiovascular: Normal rate, regular rhythm and normal heart sounds.  No murmur heard. No BLE edema. Pulmonary/Chest: Effort normal and breath sounds normal. No respiratory  distress. Abdominal: Soft.  There is no tenderness. Psychiatric: Patient has a normal mood and affect. behavior is normal. Judgment and thought content normal. Muscular Skeletal: pain with abduction and internal rotation or right arm, also positive empty can sign, normal grip,  sensation not symmetrical on both arms during exam, normal rom of right hand   PHQ2/9: Depression screen PHQ 2/9 08/24/2016  Decreased Interest 0  Down, Depressed, Hopeless 0  PHQ - 2 Score 0     Fall Risk: Fall Risk  08/24/2016  Falls in the past year? No      Functional Status Survey: Is the patient deaf or have difficulty hearing?: No Does the patient have difficulty seeing, even when wearing glasses/contacts?: No Does the patient have difficulty concentrating, remembering, or making decisions?: No Does the patient have difficulty walking or climbing stairs?: No Does the patient have difficulty dressing or bathing?: No Does the patient have difficulty doing errands alone such as visiting a doctor's office or shopping?: No    Assessment & Plan  1. Essential hypertension  Resume medication   2. Shoulder impingement, right  Discussed referral to Ortho, versus MRI or try prednisone taper and she chose the later.  - predniSONE (STERAPRED UNI-PAK 48 TAB) 10 MG (48) TBPK tablet; Take by mouth daily.  Dispense: 48 tablet; Refill: 0  3. Needs flu shot  - Flu Vaccine QUAD 36+ mos IM -refused

## 2016-09-19 ENCOUNTER — Telehealth: Payer: Self-pay | Admitting: Family Medicine

## 2016-09-19 NOTE — Telephone Encounter (Signed)
Pt would like to know if she can get a referral to Rheumatologist. She would like to go see Dr Meda Coffee at [facility].

## 2016-09-20 NOTE — Telephone Encounter (Signed)
At this point it is better to see Ortho. Not sure if Dr. Meda Coffee takes care of impingement symptoms

## 2016-09-21 NOTE — Telephone Encounter (Signed)
LMOM for pt to call the office °

## 2016-09-25 IMAGING — MG MM DIGITAL SCREENING BILAT W/ CAD
7 series · 7 of 7 positions shown · non-contrast
Comparison: Previous exam(s).

CLINICAL DATA: Screening.

EXAM:
DIGITAL SCREENING BILATERAL MAMMOGRAM WITH CAD

[L MLO (1 of 2)]
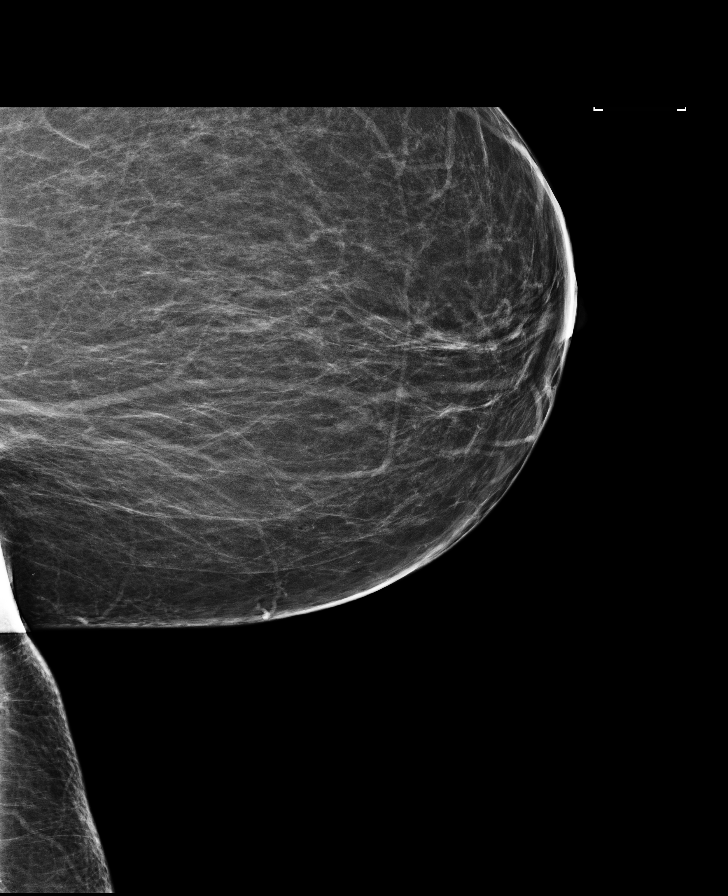

[L CC]
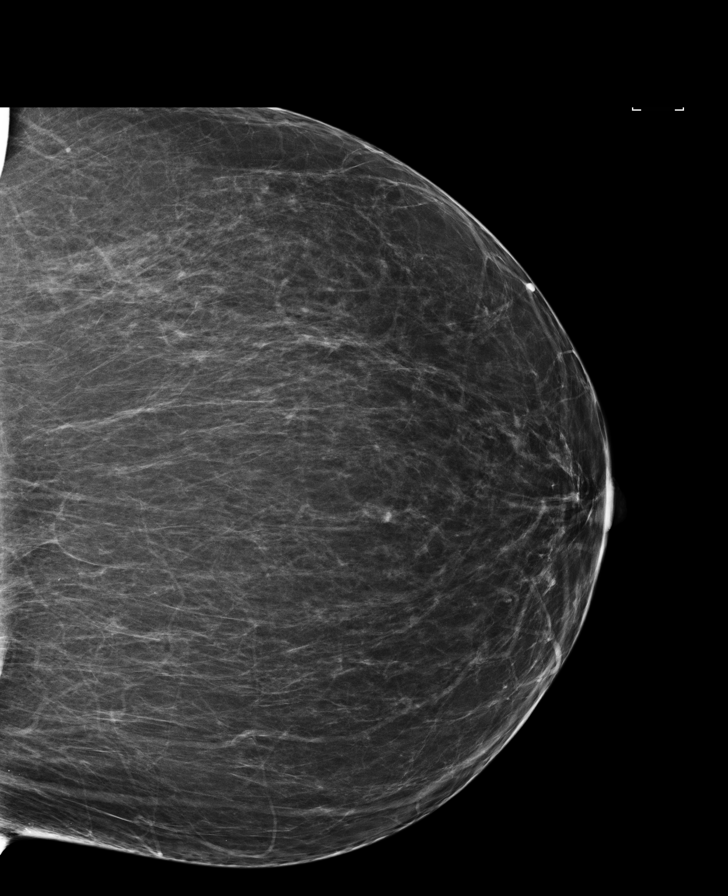

[R MLO (1 of 2)]
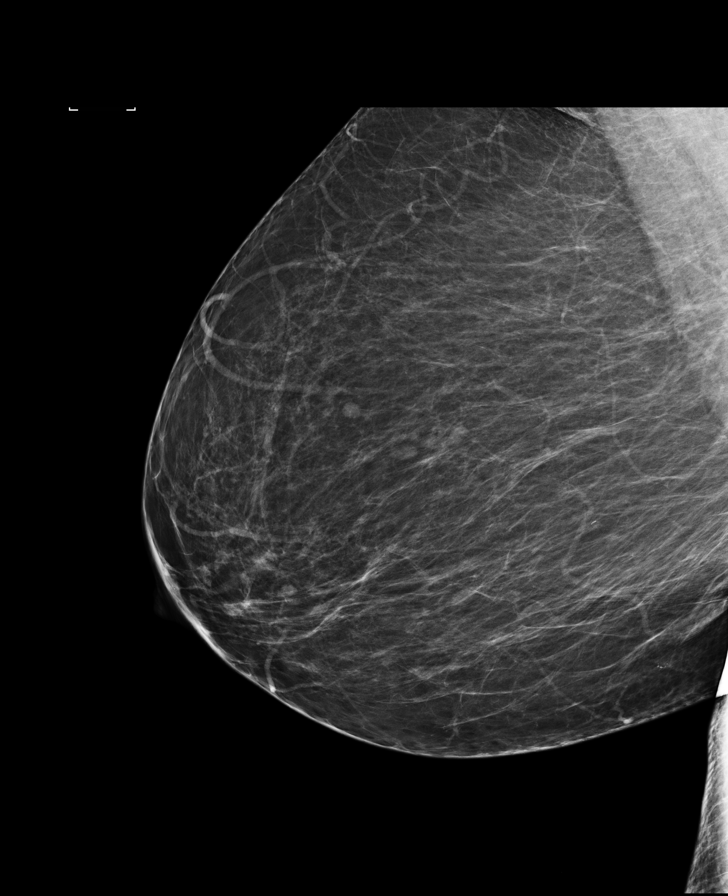

[R CC]
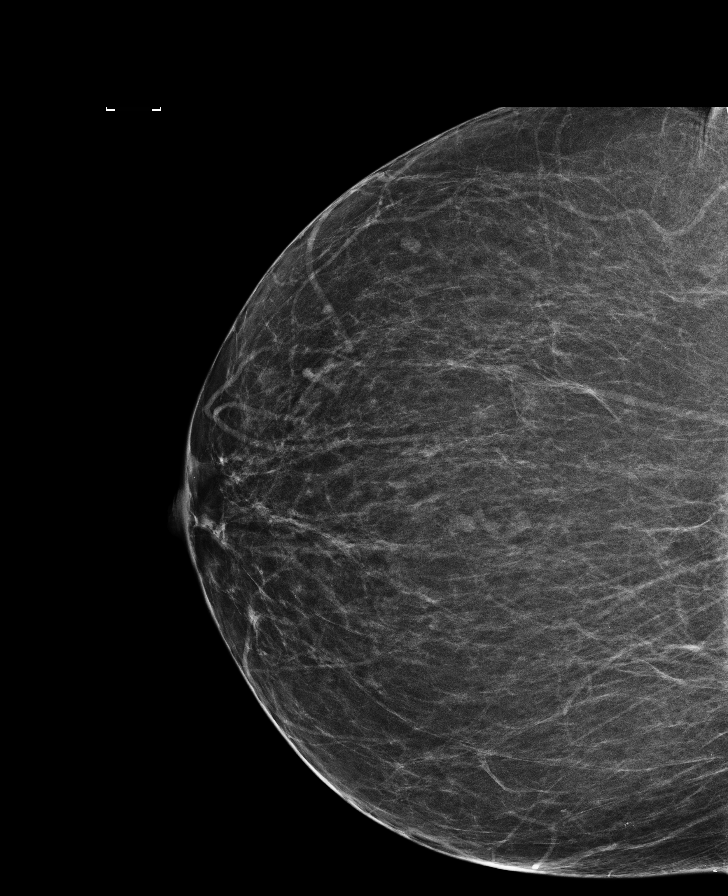

[L MLO (2 of 2)]
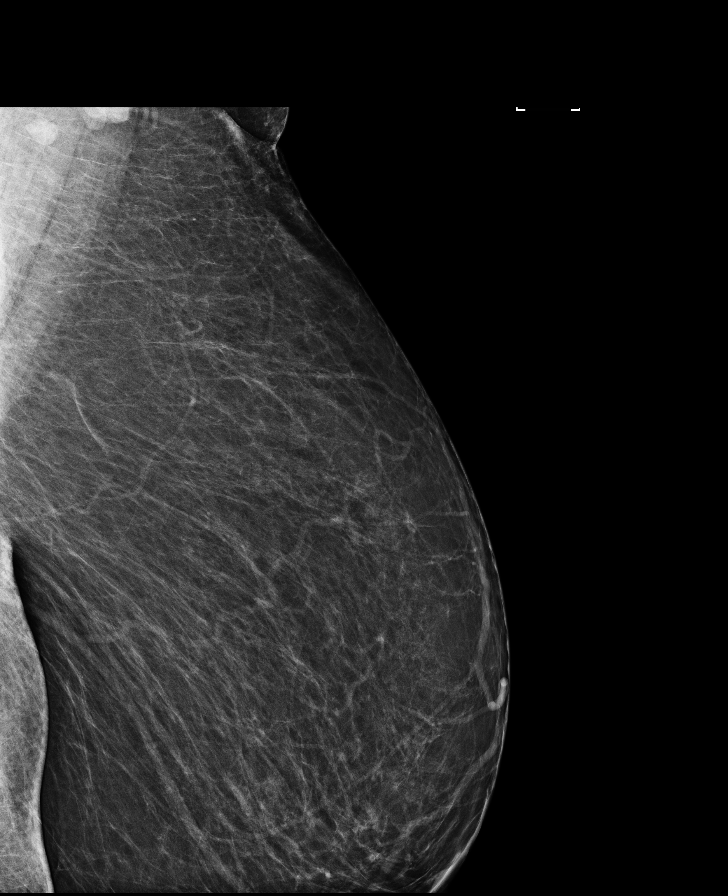

[L XCCL]
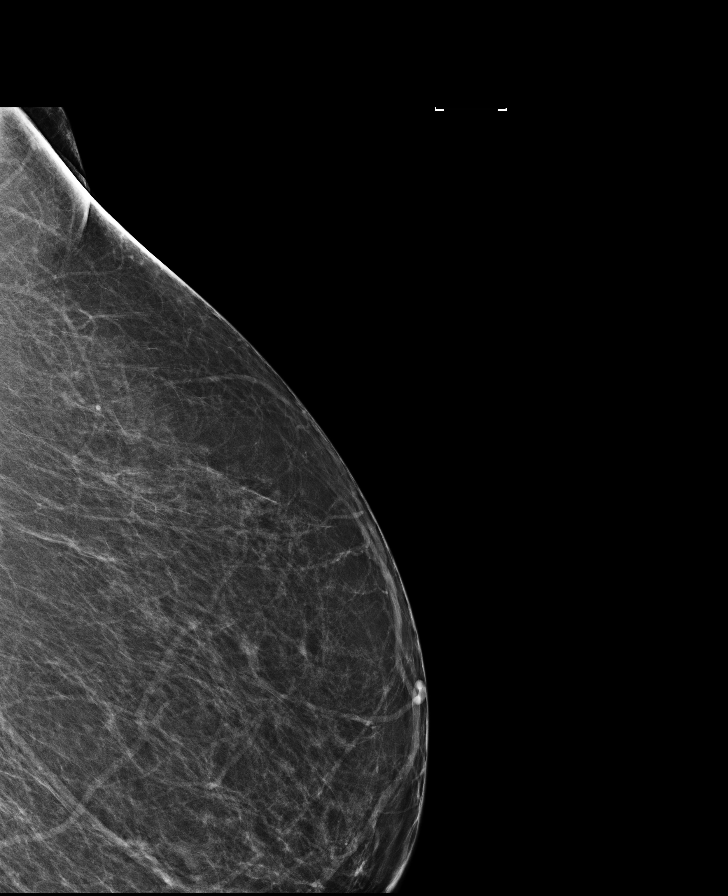

[R MLO (2 of 2)]
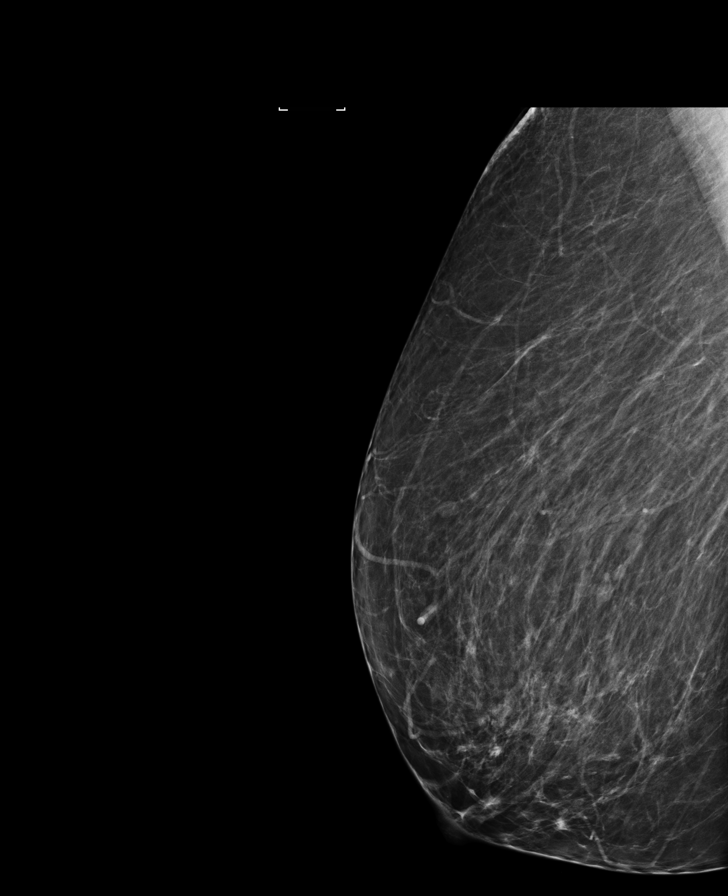

[7 of 7 positions shown; findings below may reference images not displayed]

ACR Breast Density Category b: There are scattered areas of
fibroglandular density.
FINDINGS: There are no findings suspicious for malignancy. Images were
processed with CAD.
IMPRESSION: No mammographic evidence of malignancy. A result letter of this
screening mammogram will be mailed directly to the patient.

RECOMMENDATION:
Screening mammogram in one year. (Code:AS-G-LCT)

BI-RADS CATEGORY  1: Negative.

## 2016-09-26 ENCOUNTER — Ambulatory Visit: Payer: Managed Care, Other (non HMO) | Admitting: Family Medicine

## 2016-10-14 ENCOUNTER — Encounter: Payer: Self-pay | Admitting: Family Medicine

## 2016-10-14 ENCOUNTER — Ambulatory Visit (INDEPENDENT_AMBULATORY_CARE_PROVIDER_SITE_OTHER): Payer: Managed Care, Other (non HMO) | Admitting: Family Medicine

## 2016-10-14 VITALS — BP 152/102 | HR 80 | Temp 98.0°F | Resp 18 | Ht 62.0 in | Wt 209.4 lb

## 2016-10-14 DIAGNOSIS — M7541 Impingement syndrome of right shoulder: Secondary | ICD-10-CM | POA: Diagnosis not present

## 2016-10-14 DIAGNOSIS — R5383 Other fatigue: Secondary | ICD-10-CM

## 2016-10-14 DIAGNOSIS — J4520 Mild intermittent asthma, uncomplicated: Secondary | ICD-10-CM | POA: Diagnosis not present

## 2016-10-14 DIAGNOSIS — G43109 Migraine with aura, not intractable, without status migrainosus: Secondary | ICD-10-CM | POA: Diagnosis not present

## 2016-10-14 DIAGNOSIS — E669 Obesity, unspecified: Secondary | ICD-10-CM | POA: Diagnosis not present

## 2016-10-14 DIAGNOSIS — J45991 Cough variant asthma: Secondary | ICD-10-CM | POA: Diagnosis not present

## 2016-10-14 DIAGNOSIS — I10 Essential (primary) hypertension: Secondary | ICD-10-CM

## 2016-10-14 LAB — COMPLETE METABOLIC PANEL WITH GFR
ALBUMIN: 4.1 g/dL (ref 3.6–5.1)
ALT: 17 U/L (ref 6–29)
AST: 19 U/L (ref 10–35)
Alkaline Phosphatase: 89 U/L (ref 33–130)
BUN: 15 mg/dL (ref 7–25)
CALCIUM: 10 mg/dL (ref 8.6–10.4)
CHLORIDE: 106 mmol/L (ref 98–110)
CO2: 20 mmol/L (ref 20–31)
CREATININE: 0.88 mg/dL (ref 0.50–1.05)
GFR, Est African American: 87 mL/min (ref >=60)
GFR, Est Non African American: 76 mL/min (ref >=60)
GLUCOSE: 91 mg/dL (ref 65–99)
POTASSIUM: 4.7 mmol/L (ref 3.5–5.3)
SODIUM: 140 mmol/L (ref 135–146)
Total Bilirubin: 0.5 mg/dL (ref 0.2–1.2)
Total Protein: 7.5 g/dL (ref 6.1–8.1)

## 2016-10-14 LAB — CBC WITH DIFFERENTIAL/PLATELET
BASOS ABS: 66 {cells}/uL (ref 0–200)
Basophils Relative: 1 %
EOS ABS: 198 {cells}/uL (ref 15–500)
Eosinophils Relative: 3 %
HCT: 41.3 % (ref 35.0–45.0)
Hemoglobin: 13.8 g/dL (ref 11.7–15.5)
LYMPHS PCT: 28 %
Lymphs Abs: 1848 cells/uL (ref 850–3900)
MCH: 27.2 pg (ref 27.0–33.0)
MCHC: 33.4 g/dL (ref 32.0–36.0)
MCV: 81.3 fL (ref 80.0–100.0)
MONOS PCT: 7 %
MPV: 9.7 fL (ref 7.5–12.5)
Monocytes Absolute: 462 cells/uL (ref 200–950)
NEUTROS PCT: 61 %
Neutro Abs: 4026 cells/uL (ref 1500–7800)
PLATELETS: 369 10*3/uL (ref 140–400)
RBC: 5.08 MIL/uL (ref 3.80–5.10)
RDW: 12.9 % (ref 11.0–15.0)
WBC: 6.6 10*3/uL (ref 3.8–10.8)

## 2016-10-14 LAB — HEMOGLOBIN A1C
HEMOGLOBIN A1C: 5.9 % — AB (ref <5.7)
Mean Plasma Glucose: 123 mg/dL

## 2016-10-14 LAB — LIPID PANEL
CHOL/HDL RATIO: 2.3 ratio (ref <=5.0)
Cholesterol: 150 mg/dL (ref 125–200)
HDL: 64 mg/dL (ref >=46)
LDL CALC: 72 mg/dL (ref <130)
Triglycerides: 69 mg/dL (ref <150)
VLDL: 14 mg/dL (ref <30)

## 2016-10-14 LAB — TSH: TSH: 1.02 mIU/L

## 2016-10-14 LAB — VITAMIN B12: VITAMIN B 12: 383 pg/mL (ref 200–1100)

## 2016-10-14 MED ORDER — GABAPENTIN 100 MG PO CAPS: 100.0000 mg | ORAL_CAPSULE | Freq: Every day | ORAL | 0 refills | Status: DC

## 2016-10-14 MED ORDER — OLMESARTAN MEDOXOMIL-HCTZ 20-12.5 MG PO TABS: 1.0000 | ORAL_TABLET | Freq: Every day | ORAL | 0 refills | Status: DC

## 2016-10-14 NOTE — Progress Notes (Signed)
Name: Tammy Swanson   MRN: [id number]    DOB: 1966/11/29   Date:10/14/2016       Progress Note  Subjective  Chief Complaint  Chief Complaint  Patient presents with  . Follow-up    1 month F/U  . Hypertension    Patient states her BP has been all over the place, she has been taking her medication every other day. She has been having leg cramps with it and unable to tolerate medication daily.  . Asthma    Only has flairs up when walking for prolong periods but states it is better with Qvar.   . Obesity    Stop Belviq due to making her have excessive hot flashes and dry mouth. Like Qsymia better but Insurance would not cover it.  . Migraine    Feels like they are triggered by stress.    HPI  Paresthesia or right arm: she states symptoms started about one year ago, she states numbness goes in segments, from right upper arm, to lower upper arm and at times the hand, she was seen by Ortho, Dr. Tamala Julian in the past, but is now seeing Dr. Rudene Christians ( [place] [facility] Nigh) , she was given Meloxicam last week, but is still not helping her, keeps her up at night, she is not weak but she has paresthesia that is worse on right thumb and index finger. She states shaking her right hand helps with symptoms. Discussed NCS/EMG - she states she had one in the past, she also failed prednisone taper. We will contact Ortho to order NCS  HTN: she has been in pain, not sleeping at night. She had stopped Tribenzor because her bp was running low, however over the past few weeks bp has been up again. She is taking Tribenzor but bp is still high. Explained that she needs to stop Meloxicam and we will try Benicarhctz to control symptoms without causing hypotension  Asthma Cough Variant: she is taking Qvar daily and cough has resolved with medication. No SOB or wheezing  Migraine: she sees neurologist , Dr. Brett Fairy, and she stopped taking Imitrex, she states shoulder is causing so much pain that currently not bothered by  migraines.   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 Hypertension 08/24/2016  . Asthma, mild intermittent 08/24/2016  . History of liver failure 08/24/2016  . Migraine without aura and without status migrainosus, not intractable 09/10/2015  . Eczema 09/10/2015  . Bad memory 06/01/2015  . Menopause 06/01/2015  . Cough variant asthma 06/01/2015  . Obesity (BMI 30-39.9) 06/01/2015  . Nodule, subcutaneous 06/01/2015  . Lesion of ulnar nerve 06/01/2015  . Vitiligo 06/01/2015  . Benign hypertension 09/03/2010    Past Surgical History:  Procedure Laterality Date  . ABDOMINAL EXPLORATION SURGERY     x3  . ABDOMINAL HYSTERECTOMY  2013  . ABDOMINAL HYSTERECTOMY  2014  . CHOLECYSTECTOMY    . FOOT SURGERY    . FOOT SURGERY Left 2007/2009   Dr. Elvina Mattes  . KNEE ARTHROSCOPY Left 1985  . KNEE SURGERY Left   . MANDIBLE FRACTURE SURGERY    . NASAL SINUS SURGERY    . NASAL SINUS SURGERY  2012  . RECONSTRUCTION MANDIBLE / MAXILLA  [place]    . TONSILLECTOMY  1981  . TRIGGER FINGER RELEASE      Family History  Problem Relation Age of Onset  . Hypertension Mother   . Cancer Mother     Breast   . Cancer Father   . Hypertension Sister   . Cancer Maternal Aunt     3 sisters with Breast Cancer  . Cancer Paternal Aunt   . Cancer Paternal Uncle   . Hypertension Maternal Grandmother   . Hypertension Maternal Grandfather   . HIV/AIDS Mother   . Breast cancer Mother     Social History   Social History  . Marital status: Married    Spouse name: N/A  . Number of children: N/A  . Years of education: N/A   Occupational History  . Not on file.   Social History Main Topics  . Smoking status: Never Smoker  . Smokeless tobacco:  Never Used  . Alcohol use Yes     Comment: occasionally  . Drug use: No  . Sexual activity: Yes    Partners: Male     Comment: Hysterectomy   Other Topics Concern  . Not on file   Social History Narrative   ** Merged History Encounter **         Current Outpatient Prescriptions:  .  albuterol (VENTOLIN HFA) 108 (90 Base) MCG/ACT inhaler, Inhale 2 puffs into the lungs every 6 (six) hours as needed for wheezing or shortness of breath., Disp: , Rfl:  .  beclomethasone (QVAR) 40 MCG/ACT inhaler, Inhale 2 puffs into the lungs 2 (two) times daily., Disp: , Rfl:  .  tacrolimus (PROTOPIC) 0.1 % ointment, Apply 1 application topically daily., Disp: 300 g, Rfl: 1 .  olmesartan-hydrochlorothiazide (BENICAR HCT) 20-12.5 MG tablet, Take 1 tablet by mouth daily., Disp: 30 tablet, Rfl: 0  Allergies  Allergen Reactions  . Latex Anaphylaxis  . Hydrocodone-Acetaminophen     hives  . Vicodin [Hydrocodone-Acetaminophen] Hives  . Latex Hives and Rash     ROS  Constitutional: Negative for fever or weight change.  Respiratory: Negative for cough and shortness of breath.   Cardiovascular: Negative for chest pain or palpitations.  Gastrointestinal: Negative for abdominal pain, no bowel changes.  Musculoskeletal: Negative for gait problem or joint swelling.  Skin: Negative for rash.  Neurological: Negative for dizziness or headache.  No other specific complaints in a complete review of systems (except as listed in HPI above).  Objective  Vitals:   10/14/16 0932  BP: (!) 152/102  Pulse: 80  Resp: 18  Temp: 98 F (36.7 C)  TempSrc: Oral  SpO2: 98%  Weight: 209 lb 6.4 oz (95 kg)  Height: 5\' 2"  (1.575 m)    Body mass index is 38.3 kg/m.  Physical Exam  Constitutional: Patient appears well-developed and well-nourished. Obese  No distress.  HEENT: head atraumatic, normocephalic, pupils equal and reactive to light,neck supple, throat within normal limits Cardiovascular: Normal rate,  regular rhythm and normal heart sounds.  No murmur heard. No BLE edema. Pulmonary/Chest: Effort normal and breath sounds normal. No respiratory distress. Abdominal: Soft.  There is no tenderness. Psychiatric: Patient has a normal mood and affect. behavior is normal. Judgment and thought content normal. Muscular Skeletal: pain on right trapezium with head rotated to left side, paresthesia on right index finger and thumb, some impingement sign on right shoulder, positive pain right shoulder with internal rotation   PHQ2/9: Depression screen [facility] 2/9 08/24/2016 09/10/2015 06/04/2015  Decreased Interest 0 0 0  Down, Depressed, Hopeless 0 0 0  PHQ - 2 Score 0 0 0     Fall Risk: Fall Risk  08/24/2016 09/10/2015 06/04/2015  Falls in the past year? No No Yes  Number falls in past yr: - - 1  Injury with Fall? - - No      Assessment & Plan  1. Mild intermittent asthma without complication  Normal today - Spirometry: Peak  2. Essential hypertension  - olmesartan-hydrochlorothiazide (BENICAR HCT) 20-12.5 MG tablet; Take 1 tablet by mouth daily.  Dispense: 30 tablet; Refill: 0 - COMPLETE METABOLIC PANEL WITH GFR - Lipid panel  3. Obesity (BMI 30-39.9)  Discussed with the patient the risk posed by an increased BMI. Discussed importance of portion control, calorie counting and at least 150 minutes of physical activity weekly. Avoid sweet beverages and drink more water. Eat at least 6 servings of fruit and vegetables daily   4. Migraine with aura and without status migrainosus, not intractable  Under control  5. Cough variant asthma  Continue Qvar, doing well at this time  6. Shoulder impingement, right  NCS - gabapentin (NEURONTIN) 100 MG capsule; Take 1-3 capsules (100-300 mg total) by mouth at bedtime.  Dispense: 90 capsule; Refill: 0   7. Other fatigue  - Hemoglobin A1c - VITAMIN D 25 Hydroxy (Vit-D Deficiency, Fractures) - Vitamin B12 - TSH - CBC with  Differential/Platelet

## 2016-10-15 ENCOUNTER — Encounter: Payer: Self-pay | Admitting: Family Medicine

## 2016-10-15 DIAGNOSIS — R7303 Prediabetes: Secondary | ICD-10-CM | POA: Insufficient documentation

## 2016-10-15 LAB — VITAMIN D 25 HYDROXY (VIT D DEFICIENCY, FRACTURES): VIT D 25 HYDROXY: 32 ng/mL (ref 30–100)

## 2016-11-11 ENCOUNTER — Other Ambulatory Visit: Payer: Self-pay | Admitting: Family Medicine

## 2016-11-11 DIAGNOSIS — M7541 Impingement syndrome of right shoulder: Secondary | ICD-10-CM

## 2016-11-11 DIAGNOSIS — I10 Essential (primary) hypertension: Secondary | ICD-10-CM

## 2016-11-11 NOTE — Telephone Encounter (Signed)
Patient requesting refill of Gabapentin and Benicar to [facility].

## 2016-11-14 ENCOUNTER — Telehealth: Payer: Self-pay | Admitting: Family Medicine

## 2016-11-14 ENCOUNTER — Encounter: Payer: Self-pay | Admitting: Family Medicine

## 2016-11-14 ENCOUNTER — Ambulatory Visit (INDEPENDENT_AMBULATORY_CARE_PROVIDER_SITE_OTHER): Payer: Managed Care, Other (non HMO) | Admitting: Family Medicine

## 2016-11-14 VITALS — BP 148/92 | HR 87 | Temp 98.1°F | Resp 18 | Ht 62.0 in | Wt 208.6 lb

## 2016-11-14 DIAGNOSIS — Z23 Encounter for immunization: Secondary | ICD-10-CM | POA: Diagnosis not present

## 2016-11-14 DIAGNOSIS — G5601 Carpal tunnel syndrome, right upper limb: Secondary | ICD-10-CM | POA: Diagnosis not present

## 2016-11-14 DIAGNOSIS — I10 Essential (primary) hypertension: Secondary | ICD-10-CM | POA: Diagnosis not present

## 2016-11-14 MED ORDER — OLMESARTAN MEDOXOMIL-HCTZ 40-12.5 MG PO TABS: 1.0000 | ORAL_TABLET | Freq: Every day | ORAL | 1 refills | Status: DC

## 2016-11-14 MED ORDER — GABAPENTIN 300 MG PO CAPS: 300.0000 mg | ORAL_CAPSULE | Freq: Three times a day (TID) | ORAL | 2 refills | Status: DC

## 2016-11-14 NOTE — Progress Notes (Signed)
Name: Tammy Swanson   MRN: [id number]    DOB: October 16, 1966   Date:11/14/2016       Progress Note  Subjective  Chief Complaint  Chief Complaint  Patient presents with  . Follow-up    1 month F/U  . Hypertension    Started Benicar on last visit doing well leg cramps are less frequent. [facility]aitent [facility] and gets a lot lower than our readings.    HPI  HTN: patient states she has been taking bp medication daily and denies chest pain or palpitation, no dizziness. BP has improved, but still not at goal  Carpal Tunnel right : she had NCS done by Dr. Melrose Nakayama, she will see Dr. Rudene Christians next week, she is taking gabapentin 100 mg in am and 200 mg at night, pain is better controlled, down to 4/10 on her right hand, tingling is also better, still has right shoulder pain and advised to discuss it with Dr. Rudene Christians. Symptoms worse when driving and using the computer. Pain is much worse at night also.   Patient Active Problem List   Diagnosis Date Noted  . Right carpal tunnel syndrome 11/01/2016  . Prediabetes 10/15/2016  . Asthma, mild intermittent 08/24/2016  . History of liver failure 08/24/2016  . Migraine without aura and without status migrainosus, not intractable 09/10/2015  . Eczema 09/10/2015  . Bad memory 06/01/2015  . Menopause 06/01/2015  . Cough variant asthma 06/01/2015  . Obesity (BMI 30-39.9) 06/01/2015  . Nodule, subcutaneous 06/01/2015  . Lesion of ulnar nerve 06/01/2015  . Vitiligo 06/01/2015  . Benign hypertension 09/03/2010    Past Surgical History:  Procedure Laterality Date  . ABDOMINAL EXPLORATION SURGERY     x3  . ABDOMINAL HYSTERECTOMY  2013  . ABDOMINAL HYSTERECTOMY  2014  . CHOLECYSTECTOMY    . FOOT SURGERY    . FOOT SURGERY Left 2007/2009   Dr. Elvina Mattes  . KNEE ARTHROSCOPY Left 1985  . KNEE SURGERY Left   . MANDIBLE FRACTURE SURGERY    . NASAL SINUS SURGERY    . NASAL SINUS SURGERY  2012  . RECONSTRUCTION MANDIBLE / MAXILLA  1992  .  TONSILLECTOMY    . TONSILLECTOMY  1981  . TRIGGER FINGER RELEASE      Family History  Problem Relation Age of Onset  . Hypertension Mother   . Cancer Mother     Breast   . Cancer Father   . Hypertension Sister   . Cancer Maternal Aunt     3 sisters with Breast Cancer  . Cancer Paternal Aunt   . Cancer Paternal Uncle   . Hypertension Maternal Grandmother   . Hypertension Maternal Grandfather   . HIV/AIDS Mother   . Breast cancer Mother     Social History   Social History  . Marital status: Married    Spouse name: N/A  . Number of children: N/A  . Years of education: N/A   Occupational History  . Not on file.   Social History Main Topics  . Smoking status: Never Smoker  . Smokeless tobacco: Never Used  . Alcohol use Yes     Comment: occasionally  . Drug use: No  . Sexual activity: Yes    Partners: Male     Comment: Hysterectomy   Other Topics Concern  . Not on file   Social History Narrative   ** Merged History Encounter **         Current Outpatient Prescriptions:  .  albuterol (VENTOLIN HFA) 108 (90 Base) MCG/ACT inhaler, Inhale 2 puffs into the lungs every 6 (six) hours as needed for wheezing or shortness of breath., Disp: , Rfl:  .  beclomethasone (QVAR) 40 MCG/ACT inhaler, Inhale 2 puffs into the lungs 2 (two) times daily., Disp: , Rfl:  .  gabapentin (NEURONTIN) 300 MG capsule, Take 1 capsule (300 mg total) by mouth 3 (three) times daily., Disp: 90 capsule, Rfl: 2 .  tacrolimus (PROTOPIC) 0.1 % ointment, Apply 1 application topically daily., Disp: 300 g, Rfl: 1 .  olmesartan-hydrochlorothiazide (BENICAR HCT) 40-12.5 MG tablet, Take 1 tablet by mouth daily., Disp: 30 tablet, Rfl: 1  Allergies  Allergen Reactions  . Latex Anaphylaxis  . Hydrocodone-Acetaminophen     hives  . Vicodin [Hydrocodone-Acetaminophen] Hives  . Latex Hives and Rash     ROS  Ten systems reviewed and is negative except as mentioned in HPI   Objective  Vitals:    11/14/16 1208  BP: (!) 148/92  Pulse: 87  Resp: 18  Temp: 98.1 F (36.7 C)  TempSrc: Oral  SpO2: 99%  Weight: 208 lb 9.6 oz (94.6 kg)  Height: _0  (1.575 m)    Body mass index is 38.15 kg/m.  Physical Exam  Constitutional: Patient appears well-developed and well-nourished. Obese  No distress.  HEENT: head atraumatic, normocephalic, pupils equal and reactive to light,neck supple, throat within normal limits Cardiovascular: Normal rate, regular rhythm and normal heart sounds.  No murmur heard. No BLE edema. Pulmonary/Chest: Effort normal and breath sounds normal. No respiratory distress. Abdominal: Soft.  There is no tenderness. Psychiatric: Patient has a normal mood and affect. behavior is normal. Judgment and thought content normal. Muscular Skeletal: normal grip, decrease sensation on radial distribution of right hand, also pain with rom of right shoulder   Recent Results (from the past 2160 hour(s))  Hemoglobin A1c     Status: Abnormal   Collection Time: 10/14/16 11:01 AM  Result Value Ref Range   Hgb A1c MFr Bld 5.9 (H) <5.7 %    Comment:   For someone without known diabetes, a hemoglobin A1c value between 5.7% and 6.4% is consistent with prediabetes and should be confirmed with a follow-up test.   For someone with known diabetes, a value <7% indicates that their diabetes is well controlled. A1c targets should be individualized based on duration of diabetes, age, co-morbid conditions and other considerations.   This assay result is consistent with an increased risk of diabetes.   Currently, no consensus exists regarding use of hemoglobin A1c for diagnosis of diabetes in children.      Mean Plasma Glucose 123 mg/dL  COMPLETE METABOLIC PANEL WITH GFR     Status: None   Collection Time: 10/14/16 11:01 AM  Result Value Ref Range   Sodium 140 135 - 146 mmol/L   Potassium 4.7 3.5 - 5.3 mmol/L   Chloride 106 98 - 110 mmol/L   CO2 20 20 - 31 mmol/L   Glucose, Bld  91 65 - 99 mg/dL   BUN 15 7 - 25 mg/dL   Creat 0.88 0.50 - 1.05 mg/dL    Comment:   For patients > or = 52 years of age: The upper reference limit for Creatinine is approximately 13% higher for people identified as African-American.      Total Bilirubin 0.5 0.2 - 1.2 mg/dL   Alkaline Phosphatase 89 33 - 130 U/L   AST 19 10 - 35 U/L   ALT 17 6 -  29 U/L   Total Protein 7.5 6.1 - 8.1 g/dL   Albumin 4.1 3.6 - 5.1 g/dL   Calcium 10.0 8.6 - 10.4 mg/dL   GFR, Est African American 87 >=60 mL/min   GFR, Est Non African American 76 >=60 mL/min  Lipid panel     Status: None   Collection Time: 10/14/16 11:01 AM  Result Value Ref Range   Cholesterol 150 125 - 200 mg/dL   Triglycerides 69 <150 mg/dL   HDL 64 >=46 mg/dL   Total CHOL/HDL Ratio 2.3 <=5.0 Ratio   VLDL 14 <30 mg/dL   LDL Cholesterol 72 <130 mg/dL    Comment:   Total Cholesterol/HDL Ratio:CHD Risk                        Coronary Heart Disease Risk Table                                        Men       Women          1/2 Average Risk              3.4        3.3              Average Risk              5.0        4.4           2X Average Risk              9.6        7.1           3X Average Risk             23.4       11.0 Use the calculated Patient Ratio above and the CHD Risk table  to determine the patient's CHD Risk.   VITAMIN D 25 Hydroxy (Vit-D Deficiency, Fractures)     Status: None   Collection Time: 10/14/16 11:01 AM  Result Value Ref Range   Vit D, 25-Hydroxy 32 30 - 100 ng/mL    Comment: Vitamin D Status           25-OH Vitamin D        Deficiency                <20 ng/mL        Insufficiency         20 - 29 ng/mL        Optimal             > or = 30 ng/mL   For 25-OH Vitamin D testing on patients on D2-supplementation and patients for whom quantitation of D2 and D3 fractions is required, the QuestAssureD 25-OH VIT D, (D2,D3), LC/MS/MS is recommended: order code [phone] (patients > 2 yrs).   Vitamin B12      Status: None   Collection Time: 10/14/16 11:01 AM  Result Value Ref Range   Vitamin B-12 383 200 - 1,100 pg/mL  TSH     Status: None   Collection Time: 10/14/16 11:01 AM  Result Value Ref Range   TSH 1.02 mIU/L    Comment:   Reference Range   > or = 20 Years  0.40-4.50   Pregnancy Range First trimester  0.26-2.66 Second trimester  0.55-2.73 Third trimester  0.43-2.91     CBC with Differential/Platelet     Status: None   Collection Time: 10/14/16 11:01 AM  Result Value Ref Range   WBC 6.6 3.8 - 10.8 K/uL   RBC 5.08 3.80 - 5.10 MIL/uL   Hemoglobin 13.8 11.7 - 15.5 g/dL   HCT 41.3 35.0 - 45.0 %   MCV 81.3 80.0 - 100.0 fL   MCH 27.2 27.0 - 33.0 pg   MCHC 33.4 32.0 - 36.0 g/dL   RDW 12.9 11.0 - 15.0 %   Platelets 369 140 - 400 K/uL   MPV 9.7 7.5 - 12.5 fL   Neutro Abs 4,026 1,500 - 7,800 cells/uL   Lymphs Abs 1,848 850 - 3,900 cells/uL   Monocytes Absolute 462 200 - 950 cells/uL   Eosinophils Absolute 198 15 - 500 cells/uL   Basophils Absolute 66 0 - 200 cells/uL   Neutrophils Relative % 61 %   Lymphocytes Relative 28 %   Monocytes Relative 7 %   Eosinophils Relative 3 %   Basophils Relative 1 %   Smear Review Criteria for review not met       PHQ2/9: Depression screen [facility] 2/9 11/14/2016 08/24/2016 09/10/2015 06/04/2015  Decreased Interest 0 0 0 0  Down, Depressed, Hopeless 0 0 0 0  PHQ - 2 Score 0 0 0 0     Fall Risk: Fall Risk  11/14/2016 08/24/2016 09/10/2015 06/04/2015  Falls in the past year? No No No Yes  Number falls in past yr: - - - 1  Injury with Fall? - - - No     Functional Status Survey: Is the patient deaf or have difficulty hearing?: No Does the patient have difficulty seeing, even when wearing glasses/contacts?: No Does the patient have difficulty concentrating, remembering, or making decisions?: No Does the patient have difficulty walking or climbing stairs?: No Does the patient have difficulty dressing or bathing?: No Does the patient have  difficulty doing errands alone such as visiting a doctor's office or shopping?: No   Assessment & Plan  1. Essential hypertension  We will increase dose of Benicar, since bp is not at goal  - olmesartan-hydrochlorothiazide (BENICAR HCT) 40-12.5 MG tablet; Take 1 tablet by mouth daily.  Dispense: 30 tablet; Refill: 1  2. Needs flu shot  - Flu Vaccine QUAD 36+ mos PF IM (Fluarix & Fluzone Quad PF)   3. Right carpal tunnel syndrome  She had NCS done by Dr. Melrose Nakayama, she states gabapentin has improved symptoms, we will adjust dose to 368m up to three times daily   - gabapentin (NEURONTIN) 300 MG capsule; Take 1 capsule (300 mg total) by mouth 3 (three) times daily.  Dispense: 90 capsule; Refill: 2

## 2016-11-14 NOTE — Telephone Encounter (Signed)
Scott from [facility] is requesting return call. Need clarification on Gabapentin  and Benicar. Both prescriptions where send in to them on different times with different strengths.  [phone]

## 2016-11-14 NOTE — Telephone Encounter (Signed)
Called and clarified higher dosage were needed due to BP being not at goal per Dr. Ancil Boozer

## 2016-11-29 NOTE — Patient Instructions (Signed)
  Your procedure is scheduled on: 12-06-16 (TUESDAY) Report to Same Day Surgery 2nd floor medical mall [facility] Entrance-take elevator on left to 2nd floor.  Check in with surgery information desk.) To find out your arrival time please call [phone] between 1PM - 3PM on 12-02-16 (FRIDAY)  Remember: Instructions that are not followed completely may result in serious medical risk, up to and including death, or upon the discretion of your surgeon and anesthesiologist your surgery may need to be rescheduled.    _x___ 1. Do not eat food or drink liquids after midnight. No gum chewing or hard candies.     __x__ 2. No Alcohol for 24 hours before or after surgery.   __x__3. No Smoking for 24 prior to surgery.   ____  4. Bring all medications with you on the day of surgery if instructed.    __x__ 5. Notify your doctor if there is any change in your medical condition     (cold, fever, infections).     Do not wear jewelry, make-up, hairpins, clips or nail polish.  Do not wear lotions, powders, or perfumes. You may wear deodorant.  Do not shave 48 hours prior to surgery. Men may shave face and neck.  Do not bring valuables to the hospital.    [facility] is not responsible for any belongings or valuables.               Contacts, dentures or bridgework may not be worn into surgery.  Leave your suitcase in the car. After surgery it may be brought to your room.  For patients admitted to the hospital, discharge time is determined by your treatment team.   Patients discharged the day of surgery will not be allowed to drive home.  You will need someone to drive you home and stay with you the night of your procedure.    Please read over the following fact sheets that you were given:   [facility] Preparing for Surgery and or MRSA Information   _x___ Take these medicines the morning of surgery with A SIP OF WATER:    1. GABAPENTIN  2.  3.  4.  5.  6.  ____Fleets enema or Magnesium  Citrate as directed.   _x___ Use CHG Soap or sage wipes as directed on instruction sheet   _X___ Use inhalers on the day of surgery and bring to hospital day of surgery-USE Q-VAR AND ALBUTEROL INHALER AT [place]  ____ Stop metformin 2 days prior to surgery    ____ Take 1/2 of usual insulin dose the night before surgery and none on the morning of  surgery.   ____ Stop Aspirin, Coumadin, Pllavix ,Eliquis, Effient, or Pradaxa  x__ Stop Anti-inflammatories such as Advil, Aleve, Ibuprofen, Motrin, Naproxen,          Naprosyn, Goodies powders or aspirin products-Ok to take Tylenol.   ____ Stop supplements until after surgery.    ____ Bring C-Pap to the hospital.

## 2016-11-30 ENCOUNTER — Encounter
Admission: RE | Admit: 2016-11-30 | Discharge: 2016-11-30 | Disposition: A | Discharge status: Home / Self Care | Payer: Managed Care, Other (non HMO) | Source: Ambulatory Visit | Attending: Orthopedic Surgery | Admitting: Orthopedic Surgery

## 2016-11-30 DIAGNOSIS — Z01812 Encounter for preprocedural laboratory examination: Secondary | ICD-10-CM | POA: Diagnosis present

## 2016-11-30 DIAGNOSIS — Z01810 Encounter for preprocedural cardiovascular examination: Secondary | ICD-10-CM | POA: Diagnosis present

## 2016-11-30 DIAGNOSIS — I10 Essential (primary) hypertension: Secondary | ICD-10-CM | POA: Insufficient documentation

## 2016-11-30 HISTORY — DX: Other complications of anesthesia, initial encounter: T88.59XA

## 2016-11-30 HISTORY — DX: Nausea with vomiting, unspecified: R11.2

## 2016-11-30 HISTORY — DX: Unspecified osteoarthritis, unspecified site: M19.90

## 2016-11-30 HISTORY — DX: Headache: R51

## 2016-11-30 HISTORY — DX: Other specified postprocedural states: Z98.890

## 2016-11-30 HISTORY — DX: Headache, unspecified: R51.9

## 2016-11-30 HISTORY — DX: Anxiety disorder, unspecified: F41.9

## 2016-11-30 HISTORY — DX: Adverse effect of unspecified anesthetic, initial encounter: T41.45XA

## 2016-11-30 LAB — POTASSIUM: POTASSIUM: 3.8 mmol/L (ref 3.5–5.1)

## 2016-11-30 NOTE — Pre-Procedure Instructions (Signed)
SPOKE WITH DR Ronelle Nigh REGARDING ABNORMAL EKG-DR KEPHART WENT INTO EPIC AND LOOKED AT EKG AND SAID PT OK TO PROCEED WITH SURGERY

## 2016-12-05 MED ORDER — CEFAZOLIN SODIUM-DEXTROSE 2-4 GM/100ML-% IV SOLN
2.0000 g | Freq: Once | INTRAVENOUS | Status: AC
  Administered 2016-12-06: 2 g via INTRAVENOUS

## 2016-12-06 ENCOUNTER — Ambulatory Visit
Admission: RE | Admit: 2016-12-06 | Discharge: 2016-12-06 | Disposition: A | Discharge status: Home / Self Care | Payer: Managed Care, Other (non HMO) | Source: Ambulatory Visit | Attending: Orthopedic Surgery | Admitting: Orthopedic Surgery

## 2016-12-06 ENCOUNTER — Encounter
Admission: RE | Disposition: A | Discharge status: Home / Self Care | Payer: Self-pay | Source: Ambulatory Visit | Attending: Orthopedic Surgery

## 2016-12-06 ENCOUNTER — Encounter: Payer: Self-pay | Admitting: *Deleted

## 2016-12-06 ENCOUNTER — Ambulatory Visit: Payer: Managed Care, Other (non HMO) | Admitting: Anesthesiology

## 2016-12-06 DIAGNOSIS — I10 Essential (primary) hypertension: Secondary | ICD-10-CM | POA: Diagnosis not present

## 2016-12-06 DIAGNOSIS — G709 Myoneural disorder, unspecified: Secondary | ICD-10-CM | POA: Insufficient documentation

## 2016-12-06 DIAGNOSIS — G5601 Carpal tunnel syndrome, right upper limb: Secondary | ICD-10-CM | POA: Insufficient documentation

## 2016-12-06 DIAGNOSIS — R51 Headache: Secondary | ICD-10-CM | POA: Diagnosis not present

## 2016-12-06 DIAGNOSIS — Z9889 Other specified postprocedural states: Secondary | ICD-10-CM | POA: Insufficient documentation

## 2016-12-06 DIAGNOSIS — J45909 Unspecified asthma, uncomplicated: Secondary | ICD-10-CM | POA: Insufficient documentation

## 2016-12-06 HISTORY — PX: CARPAL TUNNEL RELEASE: SHX101

## 2016-12-06 SURGERY — CARPAL TUNNEL RELEASE
Anesthesia: General | Site: Hand | Laterality: Right | Wound class: Clean

## 2016-12-06 MED ORDER — METOCLOPRAMIDE HCL 5 MG/5ML PO SOLN
10.0000 mg | Freq: Once | ORAL | Status: DC
  Filled 2016-12-06: qty 10

## 2016-12-06 MED ORDER — PROPOFOL 10 MG/ML IV BOLUS
INTRAVENOUS | Status: DC | PRN
Start: 2016-12-06 — End: 2016-12-06
  Administered 2016-12-06: 200 mg via INTRAVENOUS

## 2016-12-06 MED ORDER — MIDAZOLAM HCL 2 MG/2ML IJ SOLN
INTRAMUSCULAR | Status: AC
  Filled 2016-12-06: qty 2

## 2016-12-06 MED ORDER — CEFAZOLIN SODIUM-DEXTROSE 2-4 GM/100ML-% IV SOLN
INTRAVENOUS | Status: AC
  Filled 2016-12-06: qty 100

## 2016-12-06 MED ORDER — DEXAMETHASONE SODIUM PHOSPHATE 10 MG/ML IJ SOLN
INTRAMUSCULAR | Status: DC | PRN
  Administered 2016-12-06: 5 mg via INTRAVENOUS

## 2016-12-06 MED ORDER — SCOPOLAMINE 1 MG/3DAYS TD PT72
1.0000 | MEDICATED_PATCH | Freq: Once | TRANSDERMAL | Status: DC
  Administered 2016-12-06: 1.5 mg via TRANSDERMAL

## 2016-12-06 MED ORDER — SCOPOLAMINE 1 MG/3DAYS TD PT72
MEDICATED_PATCH | TRANSDERMAL | Status: AC
  Administered 2016-12-06: 1.5 mg via TRANSDERMAL
  Filled 2016-12-06: qty 1

## 2016-12-06 MED ORDER — PROPOFOL 10 MG/ML IV BOLUS
INTRAVENOUS | Status: AC
  Filled 2016-12-06: qty 20

## 2016-12-06 MED ORDER — FENTANYL CITRATE (PF) 100 MCG/2ML IJ SOLN
INTRAMUSCULAR | Status: DC | PRN
  Administered 2016-12-06 (×3): 50 ug via INTRAVENOUS
  Administered 2016-12-06: 25 ug via INTRAVENOUS

## 2016-12-06 MED ORDER — BUPIVACAINE HCL (PF) 0.5 % IJ SOLN
INTRAMUSCULAR | Status: AC
  Filled 2016-12-06: qty 30

## 2016-12-06 MED ORDER — LACTATED RINGERS IV SOLN
INTRAVENOUS | Status: DC
  Administered 2016-12-06: 13:00:00 via INTRAVENOUS

## 2016-12-06 MED ORDER — ONDANSETRON HCL 4 MG/2ML IJ SOLN
INTRAMUSCULAR | Status: AC
  Filled 2016-12-06: qty 2

## 2016-12-06 MED ORDER — BUPIVACAINE HCL 0.5 % IJ SOLN
INTRAMUSCULAR | Status: DC | PRN
  Administered 2016-12-06: 10 mL

## 2016-12-06 MED ORDER — METOCLOPRAMIDE HCL 10 MG PO TABS: 10.0000 mg | ORAL_TABLET | Freq: Once | ORAL | Status: DC

## 2016-12-06 MED ORDER — GLYCOPYRROLATE 0.2 MG/ML IJ SOLN
INTRAMUSCULAR | Status: DC | PRN
  Administered 2016-12-06: 0.2 mg via INTRAVENOUS

## 2016-12-06 MED ORDER — FENTANYL CITRATE (PF) 100 MCG/2ML IJ SOLN: 25.0000 ug | INTRAMUSCULAR | Status: DC | PRN

## 2016-12-06 MED ORDER — FENTANYL CITRATE (PF) 100 MCG/2ML IJ SOLN
INTRAMUSCULAR | Status: AC
  Filled 2016-12-06: qty 2

## 2016-12-06 MED ORDER — GLYCOPYRROLATE 0.2 MG/ML IJ SOLN
INTRAMUSCULAR | Status: AC
  Filled 2016-12-06: qty 1

## 2016-12-06 MED ORDER — KETOROLAC TROMETHAMINE 30 MG/ML IJ SOLN
INTRAMUSCULAR | Status: AC
  Filled 2016-12-06: qty 1

## 2016-12-06 MED ORDER — FAMOTIDINE 20 MG PO TABS
20.0000 mg | ORAL_TABLET | Freq: Once | ORAL | Status: AC
  Administered 2016-12-06: 20 mg via ORAL

## 2016-12-06 MED ORDER — KETOROLAC TROMETHAMINE 30 MG/ML IJ SOLN
INTRAMUSCULAR | Status: DC | PRN
  Administered 2016-12-06: 30 mg via INTRAVENOUS

## 2016-12-06 MED ORDER — ONDANSETRON HCL 4 MG/2ML IJ SOLN: 4.0000 mg | Freq: Once | INTRAMUSCULAR | Status: DC | PRN

## 2016-12-06 MED ORDER — MIDAZOLAM HCL 5 MG/5ML IJ SOLN
INTRAMUSCULAR | Status: DC | PRN
  Administered 2016-12-06: 2 mg via INTRAVENOUS

## 2016-12-06 MED ORDER — TRAMADOL HCL 50 MG PO TABS: 50.0000 mg | ORAL_TABLET | Freq: Four times a day (QID) | ORAL | 0 refills | Status: DC | PRN

## 2016-12-06 MED ORDER — LIDOCAINE 2% (20 MG/ML) 5 ML SYRINGE
INTRAMUSCULAR | Status: AC
  Filled 2016-12-06: qty 5

## 2016-12-06 MED ORDER — FAMOTIDINE 20 MG PO TABS
ORAL_TABLET | ORAL | Status: AC
  Administered 2016-12-06: 20 mg via ORAL
  Filled 2016-12-06: qty 1

## 2016-12-06 MED ORDER — FENTANYL CITRATE (PF) 250 MCG/5ML IJ SOLN
INTRAMUSCULAR | Status: AC
  Filled 2016-12-06: qty 5

## 2016-12-06 MED ORDER — METOCLOPRAMIDE HCL 10 MG PO TABS
ORAL_TABLET | ORAL | Status: AC
  Filled 2016-12-06: qty 1

## 2016-12-06 MED ORDER — LIDOCAINE HCL (PF) 2 % IJ SOLN
INTRAMUSCULAR | Status: DC | PRN
  Administered 2016-12-06: 50 mg

## 2016-12-06 MED ORDER — ONDANSETRON HCL 4 MG/2ML IJ SOLN
INTRAMUSCULAR | Status: DC | PRN
  Administered 2016-12-06: 4 mg via INTRAVENOUS

## 2016-12-06 SURGICAL SUPPLY — 30 items
BANDAGE ACE 3X5.8 VEL STRL LF (GAUZE/BANDAGES/DRESSINGS) ×2 IMPLANT
BNDG ESMARK 4X12 TAN STRL LF (GAUZE/BANDAGES/DRESSINGS) ×2 IMPLANT
CANISTER SUCT 1200ML W/VALVE (MISCELLANEOUS) ×2 IMPLANT
CHLORAPREP W/TINT 26ML (MISCELLANEOUS) ×2 IMPLANT
CUFF TOURN 18 STER (MISCELLANEOUS) ×1 IMPLANT
ELECT CAUTERY NDL 2.0 MIC (NEEDLE) IMPLANT
ELECT CAUTERY NEEDLE 2.0 MIC (NEEDLE) IMPLANT
ELECT CAUTERY NEEDLE TIP 1.0 (MISCELLANEOUS)
ELECTRODE CAUTERY NEDL TIP 1.0 (MISCELLANEOUS) IMPLANT
GAUZE PETRO XEROFOAM 1X8 (MISCELLANEOUS) ×2 IMPLANT
GAUZE SPONGE 4X4 12PLY STRL (GAUZE/BANDAGES/DRESSINGS) ×2 IMPLANT
GLOVE BIOGEL PI IND STRL 9 (GLOVE) ×1 IMPLANT
GLOVE BIOGEL PI INDICATOR 9 (GLOVE) ×1
GLOVE SURG SYN 9.0  PF PI (GLOVE) ×1
GLOVE SURG SYN 9.0 PF PI (GLOVE) ×1 IMPLANT
GOWN SRG 2XL LVL 4 RGLN SLV (GOWNS) ×1 IMPLANT
GOWN STRL NON-REIN 2XL LVL4 (GOWNS) ×2
GOWN STRL REUS W/ TWL LRG LVL3 (GOWN DISPOSABLE) ×1 IMPLANT
GOWN STRL REUS W/TWL 2XL LVL3 (GOWN DISPOSABLE) ×2 IMPLANT
GOWN STRL REUS W/TWL LRG LVL3 (GOWN DISPOSABLE) ×2
KIT RM TURNOVER STRD PROC AR (KITS) ×2 IMPLANT
NS IRRIG 500ML POUR BTL (IV SOLUTION) ×2 IMPLANT
PACK EXTREMITY ARMC (MISCELLANEOUS) ×2 IMPLANT
PAD CAST CTTN 4X4 STRL (SOFTGOODS) ×1 IMPLANT
PADDING CAST COTTON 4X4 STRL (SOFTGOODS) ×2
STOCKINETTE 48X4 2 PLY STRL (GAUZE/BANDAGES/DRESSINGS) ×1 IMPLANT
STOCKINETTE STRL 4IN 9604848 (GAUZE/BANDAGES/DRESSINGS) ×2 IMPLANT
SUT ETHILON 4-0 (SUTURE) ×2
SUT ETHILON 4-0 FS2 18XMFL BLK (SUTURE) ×1
SUTURE ETHLN 4-0 FS2 18XMF BLK (SUTURE) ×1 IMPLANT

## 2016-12-06 NOTE — Anesthesia Preprocedure Evaluation (Signed)
Anesthesia Evaluation  Patient identified by MRN, date of birth, ID band Patient awake    Reviewed: Allergy & Precautions, H&P , NPO status , Patient's Chart, lab work & pertinent test results, reviewed documented beta blocker date and time   History of Anesthesia Complications (+) PONV and history of anesthetic complications  Airway Mallampati: II  TM Distance: >3 FB Neck ROM: full    Dental  (+) Teeth Intact   Pulmonary neg pulmonary ROS, asthma ,    Pulmonary exam normal        Cardiovascular Exercise Tolerance: Good hypertension, negative cardio ROS Normal cardiovascular exam Rate:Normal     Neuro/Psych  Headaches,  Neuromuscular disease negative neurological ROS  negative psych ROS   GI/Hepatic negative GI ROS, Neg liver ROS,   Endo/Other  negative endocrine ROS  Renal/GU negative Renal ROS  negative genitourinary   Musculoskeletal   Abdominal   Peds  Hematology negative hematology ROS (+)   Anesthesia Other Findings   Reproductive/Obstetrics negative OB ROS                             Anesthesia Physical Anesthesia Plan  ASA: III  Anesthesia Plan: General LMA   Post-op Pain Management:    Induction:   Airway Management Planned:   Additional Equipment:   Intra-op Plan:   Post-operative Plan:   Informed Consent: I have reviewed the patients History and Physical, chart, labs and discussed the procedure including the risks, benefits and alternatives for the proposed anesthesia with the patient or authorized representative who has indicated his/her understanding and acceptance.     Plan Discussed with: CRNA  Anesthesia Plan Comments:         Anesthesia Quick Evaluation

## 2016-12-06 NOTE — H&P (Signed)
Reviewed paper H+P, will be scanned into chart. No changes noted.  

## 2016-12-06 NOTE — Anesthesia Procedure Notes (Signed)
Procedure Name: LMA Insertion Performed by: Rosy Estabrook Pre-anesthesia Checklist: Patient identified, Patient being monitored, Timeout performed, Emergency Drugs available and Suction available Patient Re-evaluated:Patient Re-evaluated prior to inductionOxygen Delivery Method: Circle system utilized Preoxygenation: Pre-oxygenation with 100% oxygen Intubation Type: IV induction Ventilation: Mask ventilation without difficulty LMA: LMA inserted LMA Size: 4.0 Tube type: Oral Number of attempts: 1 Placement Confirmation: positive ETCO2 and breath sounds checked- equal and bilateral Tube secured with: Tape Dental Injury: Teeth and Oropharynx as per pre-operative assessment      

## 2016-12-06 NOTE — Op Note (Signed)
12/06/2016  1:30 PM  PATIENT:  Tammy Swanson  50 y.o. female  PRE-OPERATIVE DIAGNOSIS:  carpal tunnel syndrome of right wrist  POST-OPERATIVE DIAGNOSIS:  Same  PROCEDURE:  Procedure(s): CARPAL TUNNEL RELEASE (Right)  SURGEON: Laurene Footman, MD  ASSISTANTS: None  ANESTHESIA:   local and general  EBL:  Total I/O In: 300 [I.V.:300] Out: -   BLOOD ADMINISTERED:none  DRAINS: none   LOCAL MEDICATIONS USED:  MARCAINE     SPECIMEN:  No Specimen  DISPOSITION OF SPECIMEN:  N/A  COUNTS:  YES  TOURNIQUET:   9 minutes at 250 murmurs mercury  IMPLANTS: None  DICTATION: .Dragon Dictation patient was brought to the operating room and after adequate general anesthesia was obtained, the right arm was prepped and draped in usual sterile fashion. After patient identification and timeout procedures were completed, tourniquet was raised to 250 mms mercury.. A volar incision was made in line with ring metacarpal approximate 1 inch in length. Subcutaneous tissues tissue spread and the transverse carpal ligament was identified and incised. A vascular hemostat was placed underneath this and first distal releases carried out until there is fat noted around the nerve consistent with complete release and then proximally releases carried out and just proximal to the wrist flexion crease there was an area of thickened ligament and after release of this there is good vascular blush to the nerve and there appeared to be no further compression to the nerve. There no masses within the carpal tunnel. There was mild flexor tenosynovitis. The wound was then irrigated and closed with simple interrupted 5-0 nylon. At the start of the case 10 cc of half percent Sensorcaine without epinephrine was infiltrated in the wound for postop analgesia. Xeroform 4 x 4 web roll and Ace wrap applied.  PLAN OF CARE: Discharge to home after PACU  PATIENT DISPOSITION:  PACU - hemodynamically stable.

## 2016-12-06 NOTE — Discharge Instructions (Addendum)
Take pain medicine as directed. Keep hand elevated. Work fingers is much as possible. Loosen Ace wrap if fingers swell at all but leave ldeeper cotton roll in place  AMBULATORY SURGERY  DISCHARGE INSTRUCTIONS   1) The drugs that you were given will stay in your system until tomorrow so for the next 24 hours you should not:  A) Drive an automobile B) Make any legal decisions C) Drink any alcoholic beverage   2) You may resume regular meals tomorrow.  Today it is better to start with liquids and gradually work up to solid foods.  You may eat anything you prefer, but it is better to start with liquids, then soup and crackers, and gradually work up to solid foods.   3) Please notify your doctor immediately if you have any unusual bleeding, trouble breathing, redness and pain at the surgery site, drainage, fever, or pain not relieved by medication.    4) Additional Instructions:        Please contact your physician with any problems or Same Day Surgery at [phone], Monday through Friday 6 am to 4 pm, or [place] [facility] number at [phone].

## 2016-12-06 NOTE — Transfer of Care (Signed)
Immediate Anesthesia Transfer of Care Note  Patient: Tammy Swanson  Procedure(s) Performed: Procedure(s): CARPAL TUNNEL RELEASE (Right)  Patient Location: PACU  Anesthesia Type:General  Level of Consciousness: sedated  Airway & Oxygen Therapy: Patient Spontanous Breathing and Patient connected to face mask oxygen  Post-op Assessment: Report given to RN and Post -op Vital signs reviewed and stable  Post vital signs: Reviewed  Last Vitals:  Vitals:   12/06/16 1122 12/06/16 1337  BP: (!) 161/93 112/87  Pulse: 77 94  Resp: 18 16  Temp: 36.5 C 36.1 C    Last Pain:  Vitals:   12/06/16 1122  TempSrc: Oral  PainSc: 0-No pain         Complications: No apparent anesthesia complications

## 2016-12-20 NOTE — Anesthesia Postprocedure Evaluation (Signed)
Anesthesia Post Note  Patient: Shalaine A Ward-Jeffries  Procedure(s) Performed: Procedure(s) (LRB): CARPAL TUNNEL RELEASE (Right)  Patient location during evaluation: PACU Anesthesia Type: General Level of consciousness: awake and alert Pain management: pain level controlled Vital Signs Assessment: post-procedure vital signs reviewed and stable Respiratory status: spontaneous breathing, nonlabored ventilation, respiratory function stable and patient connected to nasal cannula oxygen Cardiovascular status: blood pressure returned to baseline and stable Postop Assessment: no signs of nausea or vomiting Anesthetic complications: no     Last Vitals:  Vitals:   12/06/16 1432 12/06/16 1459  BP: (!) 151/100 (!) 135/97  Pulse: 84 68  Resp: 20   Temp: 36.4 C     Last Pain:  Vitals:   12/08/16 0913  TempSrc:   PainSc: 2                  Molli Barrows

## 2017-01-17 ENCOUNTER — Encounter: Payer: Self-pay | Admitting: Family Medicine

## 2017-01-17 ENCOUNTER — Ambulatory Visit (INDEPENDENT_AMBULATORY_CARE_PROVIDER_SITE_OTHER): Payer: Commercial Managed Care - PPO | Admitting: Family Medicine

## 2017-01-17 VITALS — BP 160/90 | HR 83 | Temp 98.8°F | Resp 16 | Ht 62.0 in | Wt 209.3 lb

## 2017-01-17 DIAGNOSIS — E669 Obesity, unspecified: Secondary | ICD-10-CM

## 2017-01-17 DIAGNOSIS — R7303 Prediabetes: Secondary | ICD-10-CM | POA: Diagnosis not present

## 2017-01-17 DIAGNOSIS — J4530 Mild persistent asthma, uncomplicated: Secondary | ICD-10-CM

## 2017-01-17 DIAGNOSIS — Z9889 Other specified postprocedural states: Secondary | ICD-10-CM

## 2017-01-17 DIAGNOSIS — I10 Essential (primary) hypertension: Secondary | ICD-10-CM

## 2017-01-17 MED ORDER — AMLODIPINE-VALSARTAN-HCTZ 5-160-25 MG PO TABS: 1.0000 | ORAL_TABLET | Freq: Every day | ORAL | 1 refills | Status: DC

## 2017-01-17 MED ORDER — DULAGLUTIDE 1.5 MG/0.5ML ~~LOC~~ SOAJ: 1.5000 mg | SUBCUTANEOUS | 2 refills | Status: DC

## 2017-01-17 MED ORDER — BUDESONIDE-FORMOTEROL FUMARATE 80-4.5 MCG/ACT IN AERO: 2.0000 | INHALATION_SPRAY | Freq: Two times a day (BID) | RESPIRATORY_TRACT | 5 refills | Status: DC

## 2017-01-17 NOTE — Progress Notes (Signed)
Name: Tammy Swanson   MRN: [id number]    DOB: 02-15-1966   Date:01/17/2017       Progress Note  Subjective  Chief Complaint  Chief Complaint  Patient presents with  . Hypertension    readings have been high recently  . carpel tunnel    had surgery on right hand     HPI  HTN:  She is in on Benicar HCTZ 40/12.5. She used to take Tribenzor but states it caused cramps and bp was low. We will try Exforge - she is aware that it is a similar combination than Tribenzor. Call back if cramps returns.   History of carpal tunnel release: done by Dr. Rudene Christians 12/06/2016, already back to work, feeling better.   Asthma Cough Variant: she is having to clear her throat again and having cough and wheezing at night, she states symptoms resolves with Qvar prn, but I will switch to Symbicort since we have a free voucher.   Migraine: she sees neurologist , Dr. Brett Fairy, and she stopped taking Imitrex, she states shoulder is causing so much pain that currently not bothered by migraines.   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 Discussed possible side effects. No personal or family history of thyroid cancer   Patient Active Problem List   Diagnosis Date Noted  . Right carpal tunnel syndrome 11/01/2016  . Prediabetes 10/15/2016  . Asthma, mild intermittent 08/24/2016  . History of liver failure 08/24/2016  . Migraine without aura and without status migrainosus, not intractable 09/10/2015  . Eczema 09/10/2015  . Bad memory 06/01/2015  . Menopause 06/01/2015  . Cough variant asthma 06/01/2015  . Obesity (BMI 30-39.9) 06/01/2015  . Nodule, subcutaneous 06/01/2015  . Lesion of ulnar nerve 06/01/2015  . Vitiligo 06/01/2015  . Benign hypertension 09/03/2010    Past Surgical History:  Procedure Laterality Date  . ABDOMINAL EXPLORATION SURGERY     x3  .  ABDOMINAL HYSTERECTOMY  2013  . ABDOMINAL HYSTERECTOMY  2014  . CARPAL TUNNEL RELEASE Right 12/06/2016   Procedure: CARPAL TUNNEL RELEASE;  Surgeon: Hessie Knows, MD;  Location: [facility] ORS;  Service: Orthopedics;  Laterality: Right;  . CHOLECYSTECTOMY    . FOOT SURGERY    . FOOT SURGERY Left 2007/2009   Dr. Elvina Mattes  . KNEE ARTHROSCOPY Left 1985  . KNEE SURGERY Left   . MANDIBLE FRACTURE SURGERY    . NASAL SINUS SURGERY    . NASAL SINUS SURGERY  2012  . RECONSTRUCTION MANDIBLE / MAXILLA  1992  . TONSILLECTOMY    . TONSILLECTOMY  1981  . TRIGGER FINGER RELEASE      Family History  Problem Relation Age of Onset  . Hypertension Mother   . Cancer Mother     Breast   . Cancer Father   . Hypertension Sister   . Cancer Maternal Aunt     3 sisters with Breast Cancer  . Cancer Paternal Aunt   . Cancer Paternal Uncle   . Hypertension Maternal Grandmother   . Hypertension Maternal Grandfather   . HIV/AIDS Mother   . Breast cancer Mother     Social History   Social History  . Marital status: Married    Spouse name: N/A  . Number of children: N/A  . Years of education: N/A   Occupational History  . Not on file.  Social History Main Topics  . Smoking status: Never Smoker  . Smokeless tobacco: Never Used  . Alcohol use Yes     Comment: occasionally  . Drug use: No  . Sexual activity: Yes    Partners: Male     Comment: Hysterectomy   Other Topics Concern  . Not on file   Social History Narrative   ** Merged History Encounter **         Current Outpatient Prescriptions:  .  albuterol (VENTOLIN HFA) 108 (90 Base) MCG/ACT inhaler, Inhale 2 puffs into the lungs every 6 (six) hours as needed for wheezing or shortness of breath., Disp: , Rfl:  .  cholecalciferol (VITAMIN D) 1000 units tablet, Take 1,000 Units by mouth daily., Disp: , Rfl:  .  Cyanocobalamin (B-12) 1000 MCG SUBL, Place 1 tablet under the tongue daily., Disp: , Rfl:  .  gabapentin (NEURONTIN) 300 MG  capsule, Take 1 capsule (300 mg total) by mouth 3 (three) times daily., Disp: 90 capsule, Rfl: 2 .  tacrolimus (PROTOPIC) 0.1 % ointment, Apply 1 application topically daily., Disp: 300 g, Rfl: 1 .  traMADol (ULTRAM) 50 MG tablet, Take 1 tablet (50 mg total) by mouth every 6 (six) hours as needed., Disp: 30 tablet, Rfl: 0  Allergies  Allergen Reactions  . Latex Anaphylaxis  . Vicodin [Hydrocodone-Acetaminophen] Hives     ROS  Constitutional: Negative for fever or weight change.  Respiratory: Positive for occasional  cough and intermittent shortness of breath.   Cardiovascular: Negative for chest pain or palpitations.  Gastrointestinal: Negative for abdominal pain, no bowel changes.  Musculoskeletal: Negative for gait problem or joint swelling.  Skin: Negative for rash.  Neurological: Negative for dizziness or headache.  No other specific complaints in a complete review of systems (except as listed in HPI above).  Objective  Vitals:   01/17/17 1413 01/17/17 1428  BP: (!) 142/104 (!) 138/98  Pulse: 83   Resp: 16   Temp: 98.8 F (37.1 C)   SpO2: 97%   Weight: 209 lb 5 oz (94.9 kg)   Height: 5\' 2"  (1.575 m)     Body mass index is 38.28 kg/m.  Physical Exam  Constitutional: Patient appears well-developed and well-nourished. Obese  No distress.  HEENT: head atraumatic, normocephalic, pupils equal and reactive to light,  neck supple, throat within normal limits Cardiovascular: Normal rate, regular rhythm and normal heart sounds.  No murmur heard. No BLE edema. Pulmonary/Chest: Effort normal and breath sounds normal. No respiratory distress. Abdominal: Soft.  There is no tenderness. Psychiatric: Patient has a normal mood and affect. behavior is normal. Judgment and thought content normal.  Recent Results (from the past 2160 hour(s))  Potassium     Status: None   Collection Time: 11/30/16 12:44 PM  Result Value Ref Range   Potassium 3.8 3.5 - 5.1 mmol/L       PHQ2/9: Depression screen [facility] 2/9 11/14/2016 08/24/2016 09/10/2015 06/04/2015  Decreased Interest 0 0 0 0  Down, Depressed, Hopeless 0 0 0 0  PHQ - 2 Score 0 0 0 0     Fall Risk: Fall Risk  11/14/2016 08/24/2016 09/10/2015 06/04/2015  Falls in the past year? No No No Yes  Number falls in past yr: - - - 1  Injury with Fall? - - - No      Assessment & Plan  1. Essential hypertension  - Amlodipine-Valsartan-HCTZ 5-160-25 MG TABS; Take 1 tablet by mouth daily.  Dispense: 30 tablet; Refill: 1  2. Mild persistent asthma without complication  - budesonide-formoterol (SYMBICORT) 80-4.5 MCG/ACT inhaler; Inhale 2 puffs into the lungs 2 (two) times daily.  Dispense: 1 Inhaler; Refill: 5  3. History of carpal tunnel surgery of right wrist  Doing well  4. Obesity (BMI 30-39.9)  - Dulaglutide (TRULICITY) 1.5 0000000 SOPN; Inject 1.5 mg into the skin once a week.  Dispense: 4 pen; Refill: 2  5. Prediabetes  Discussed possible side effects - Dulaglutide (TRULICITY) 1.5 0000000 SOPN; Inject 1.5 mg into the skin once a week.  Dispense: 4 pen; Refill: 2

## 2017-03-29 ENCOUNTER — Ambulatory Visit (INDEPENDENT_AMBULATORY_CARE_PROVIDER_SITE_OTHER): Payer: Commercial Managed Care - PPO | Admitting: Family Medicine

## 2017-03-29 ENCOUNTER — Encounter: Payer: Self-pay | Admitting: Family Medicine

## 2017-03-29 VITALS — BP 118/82 | HR 93 | Temp 98.0°F | Resp 18 | Ht 62.0 in | Wt 197.9 lb

## 2017-03-29 DIAGNOSIS — G5601 Carpal tunnel syndrome, right upper limb: Secondary | ICD-10-CM | POA: Diagnosis not present

## 2017-03-29 DIAGNOSIS — E669 Obesity, unspecified: Secondary | ICD-10-CM

## 2017-03-29 DIAGNOSIS — I10 Essential (primary) hypertension: Secondary | ICD-10-CM

## 2017-03-29 DIAGNOSIS — J45991 Cough variant asthma: Secondary | ICD-10-CM

## 2017-03-29 DIAGNOSIS — R7303 Prediabetes: Secondary | ICD-10-CM | POA: Diagnosis not present

## 2017-03-29 DIAGNOSIS — G43109 Migraine with aura, not intractable, without status migrainosus: Secondary | ICD-10-CM | POA: Diagnosis not present

## 2017-03-29 MED ORDER — AMLODIPINE-VALSARTAN-HCTZ 5-160-12.5 MG PO TABS: 1.0000 | ORAL_TABLET | Freq: Every day | ORAL | 2 refills | Status: DC

## 2017-03-29 MED ORDER — GABAPENTIN 300 MG PO CAPS: 300.0000 mg | ORAL_CAPSULE | Freq: Two times a day (BID) | ORAL | 2 refills | Status: DC

## 2017-03-29 MED ORDER — DULAGLUTIDE 1.5 MG/0.5ML ~~LOC~~ SOAJ: 1.5000 mg | SUBCUTANEOUS | 2 refills | Status: DC

## 2017-03-29 NOTE — Progress Notes (Signed)
Name: Tammy Swanson   MRN: [id number]    DOB: May 18, 1966   Date:03/29/2017       Progress Note  Subjective  Chief Complaint  Chief Complaint  Patient presents with  . Follow-up    2 month F/U  . Obesity    Started Trucility on last visit and doing well. Has lost 12 pounds since last visit due to medication. Feels better and BP has been better.    HPI   HTN:  She was Benicar HCTZ 40/12.5, but bp was elevated.  She used to take Tribenzor but states it caused cramps and bp was low. We started Exforge back 01/2017. No cramps with Exforge HCTZ but has noticed a headache two hours after she takes medication. BP is down to 110's. We will decrease dose HCTZ also advised to switch and take it in the evening if symptoms persists. Return sooner if needed.   History of carpal tunnel release: done by Dr. Rudene Christians 12/06/2016, already back to work, feeling better, tried stopping Gabapentin but it causes pain on right thenar area to return  Asthma Cough Variant: she is having to clear her throat again and having cough and wheezing at night, she states symptoms resolves with Qvar prn we tried switching to Symbicort last visit, but voucher had expired, we gave her a new one today  Migraine: she sees neurologist , Dr. Brett Fairy, and she stopped taking Imitrex, she states shoulder is causing so much pain that currently not bothered by migraines.   Obesity: she tried Belviq but did not work, Conservation officer, historic buildings worked well in the past but not covered by insurance,we started her on Trulicity 35/0093 and she lost 12 lbs since started. She states medication curbs her appetite. She is feeling well on medication and denies side effects of medication.   Patient Active Problem List   Diagnosis Date Noted  . Right carpal tunnel syndrome 11/01/2016  . Prediabetes 10/15/2016  . Asthma, mild intermittent 08/24/2016  . History of liver failure 08/24/2016  . Migraine without aura and without status migrainosus, not  intractable 09/10/2015  . Eczema 09/10/2015  . Bad memory 06/01/2015  . Menopause 06/01/2015  . Cough variant asthma 06/01/2015  . Obesity (BMI 30-39.9) 06/01/2015  . Nodule, subcutaneous 06/01/2015  . Lesion of ulnar nerve 06/01/2015  . Vitiligo 06/01/2015  . Benign hypertension 09/03/2010    Past Surgical History:  Procedure Laterality Date  . ABDOMINAL EXPLORATION SURGERY     x3  . ABDOMINAL HYSTERECTOMY  2013  . ABDOMINAL HYSTERECTOMY  2014  . CARPAL TUNNEL RELEASE Right 12/06/2016   Procedure: CARPAL TUNNEL RELEASE;  Surgeon: Hessie Knows, MD;  Location: [facility] ORS;  Service: Orthopedics;  Laterality: Right;  . CHOLECYSTECTOMY    . FOOT SURGERY    . FOOT SURGERY Left 2007/2009   Dr. Elvina Mattes  . KNEE ARTHROSCOPY Left 1985  . KNEE SURGERY Left   . MANDIBLE FRACTURE SURGERY    . NASAL SINUS SURGERY    . NASAL SINUS SURGERY  2012  . RECONSTRUCTION MANDIBLE / MAXILLA  1992  . TONSILLECTOMY    . TONSILLECTOMY  1981  . TRIGGER FINGER RELEASE      Family History  Problem Relation Age of Onset  . Hypertension Mother   . Cancer Mother     Breast   . Cancer Father   . Hypertension Sister   . Cancer Maternal Aunt     3 sisters with Breast Cancer  . Cancer Paternal Aunt   .  Cancer Paternal Uncle   . Hypertension Maternal Grandmother   . Hypertension Maternal Grandfather   . HIV/AIDS Mother   . Breast cancer Mother     Social History   Social History  . Marital status: Married    Spouse name: Gaspar Bidding   . Number of children: N/A  . Years of education: N/A   Occupational History  . lab analyst  [employer]   Social History Main Topics  . Smoking status: Never Smoker  . Smokeless tobacco: Never Used  . Alcohol use Yes     Comment: occasionally  . Drug use: No  . Sexual activity: Yes    Partners: Male     Comment: Hysterectomy   Other Topics Concern  . Not on file   Social History Narrative   She is a Chiropractor for [employer]        Current Outpatient  Prescriptions:  .  albuterol (VENTOLIN HFA) 108 (90 Base) MCG/ACT inhaler, Inhale 2 puffs into the lungs every 6 (six) hours as needed for wheezing or shortness of breath., Disp: , Rfl:  .  budesonide-formoterol (SYMBICORT) 80-4.5 MCG/ACT inhaler, Inhale 2 puffs into the lungs 2 (two) times daily., Disp: 1 Inhaler, Rfl: 5 .  cholecalciferol (VITAMIN D) 1000 units tablet, Take 1,000 Units by mouth daily., Disp: , Rfl:  .  Cyanocobalamin (B-12) 1000 MCG SUBL, Place 1 tablet under the tongue daily., Disp: , Rfl:  .  Dulaglutide (TRULICITY) 1.5 ZO/1.0RU SOPN, Inject 1.5 mg into the skin once a week., Disp: 4 pen, Rfl: 2 .  gabapentin (NEURONTIN) 300 MG capsule, Take 1 capsule (300 mg total) by mouth 2 (two) times daily., Disp: 60 capsule, Rfl: 2 .  tacrolimus (PROTOPIC) 0.1 % ointment, Apply 1 application topically daily., Disp: 300 g, Rfl: 1 .  Amlodipine-Valsartan-HCTZ 5-160-12.5 MG TABS, Take 1 tablet by mouth daily., Disp: 30 tablet, Rfl: 2  Allergies  Allergen Reactions  . Latex Anaphylaxis  . Vicodin [Hydrocodone-Acetaminophen] Hives     ROS  Constitutional: Negative for fever , positive for weight change.  Respiratory: Positive for dry  cough but no  shortness of breath.   Cardiovascular: Negative for chest pain or palpitations.  Gastrointestinal: Negative for abdominal pain, no bowel changes.  Musculoskeletal: Negative for gait problem or joint swelling.  Skin: Negative for rash.  Neurological: Negative for dizziness , positive for intermittent headache.  No other specific complaints in a complete review of systems (except as listed in HPI above).  Objective  Vitals:   03/29/17 0842  BP: 118/82  Pulse: 93  Resp: 18  Temp: 98 F (36.7 C)  TempSrc: Oral  SpO2: 98%  Weight: 197 lb 14.4 oz (89.8 kg)  Height: 5\' 2"  (1.575 m)    Body mass index is 36.2 kg/m.  Physical Exam  Constitutional: Patient appears well-developed and well-nourished. Obese  No distress.  HEENT:  head atraumatic, normocephalic, pupils equal and reactive to light, neck supple, throat within normal limits Cardiovascular: Normal rate, regular rhythm and normal heart sounds.  No murmur heard. No BLE edema. Pulmonary/Chest: Effort normal and breath sounds normal. No respiratory distress. Abdominal: Soft.  There is no tenderness. Psychiatric: Patient has a normal mood and affect. behavior is normal. Judgment and thought content normal. Muscular Skeletal: well healed scar from carpal tunnel surgery of right wrist, hyperaulgesia of right thenar area right hands  PHQ2/9: Depression screen [facility] 2/9 03/29/2017 11/14/2016 08/24/2016 09/10/2015 06/04/2015  Decreased Interest 0 0 0 0 0  Down,  Depressed, Hopeless 0 0 0 0 0  PHQ - 2 Score 0 0 0 0 0     Fall Risk: Fall Risk  03/29/2017 11/14/2016 08/24/2016 09/10/2015 06/04/2015  Falls in the past year? No No No No Yes  Number falls in past yr: - - - - 1  Injury with Fall? - - - - No    Functional Status Survey: Is the patient deaf or have difficulty hearing?: No Does the patient have difficulty seeing, even when wearing glasses/contacts?: No Does the patient have difficulty concentrating, remembering, or making decisions?: No Does the patient have difficulty walking or climbing stairs?: No Does the patient have difficulty dressing or bathing?: No Does the patient have difficulty doing errands alone such as visiting a doctor's office or shopping?: No   Assessment & Plan  1. Essential hypertension  - Amlodipine-Valsartan-HCTZ 5-160-12.5 MG TABS; Take 1 tablet by mouth daily.  Dispense: 30 tablet; Refill: 2  2. Obesity (BMI 30-39.9)  Doing well, lost 12 lbs in the past 2 months - Dulaglutide (TRULICITY) 1.5 PX/1.0GY SOPN; Inject 1.5 mg into the skin once a week.  Dispense: 4 pen; Refill: 2  3. Prediabetes  - Dulaglutide (TRULICITY) 1.5 IR/4.8NI SOPN; Inject 1.5 mg into the skin once a week.  Dispense: 4 pen; Refill: 2  4. Right carpal tunnel  syndrome  - gabapentin (NEURONTIN) 300 MG capsule; Take 1 capsule (300 mg total) by mouth 2 (two) times daily.  Dispense: 60 capsule; Refill: 2   5. Migraine with aura and without status migrainosus, not intractable  Doing well at this time  6. Cough variant asthma  Try Symbicort

## 2017-04-21 ENCOUNTER — Other Ambulatory Visit: Payer: Self-pay

## 2017-04-21 DIAGNOSIS — R7303 Prediabetes: Secondary | ICD-10-CM

## 2017-04-21 DIAGNOSIS — E669 Obesity, unspecified: Secondary | ICD-10-CM

## 2017-04-21 MED ORDER — DULAGLUTIDE 1.5 MG/0.5ML ~~LOC~~ SOAJ: 1.5000 mg | SUBCUTANEOUS | 1 refills | Status: DC

## 2017-04-21 NOTE — Telephone Encounter (Signed)
Patient requesting refill of Trulicity to [facility] with a 90 day supply.

## 2017-05-10 ENCOUNTER — Other Ambulatory Visit: Payer: Self-pay | Admitting: Family Medicine

## 2017-05-10 DIAGNOSIS — Z1231 Encounter for screening mammogram for malignant neoplasm of breast: Secondary | ICD-10-CM

## 2017-06-01 ENCOUNTER — Ambulatory Visit
Admission: RE | Admit: 2017-06-01 | Discharge: 2017-06-01 | Disposition: A | Discharge status: Home / Self Care | Payer: Commercial Managed Care - PPO | Source: Ambulatory Visit | Attending: Family Medicine | Admitting: Family Medicine

## 2017-06-01 DIAGNOSIS — Z1231 Encounter for screening mammogram for malignant neoplasm of breast: Secondary | ICD-10-CM | POA: Insufficient documentation

## 2017-06-29 ENCOUNTER — Encounter: Payer: Self-pay | Admitting: Family Medicine

## 2017-06-29 ENCOUNTER — Ambulatory Visit (INDEPENDENT_AMBULATORY_CARE_PROVIDER_SITE_OTHER): Payer: Commercial Managed Care - PPO | Admitting: Family Medicine

## 2017-06-29 VITALS — BP 122/78 | HR 101 | Temp 98.0°F | Resp 18 | Ht 62.0 in | Wt 194.5 lb

## 2017-06-29 DIAGNOSIS — I10 Essential (primary) hypertension: Secondary | ICD-10-CM | POA: Diagnosis not present

## 2017-06-29 DIAGNOSIS — Z13220 Encounter for screening for lipoid disorders: Secondary | ICD-10-CM

## 2017-06-29 DIAGNOSIS — Z9889 Other specified postprocedural states: Secondary | ICD-10-CM

## 2017-06-29 DIAGNOSIS — R7303 Prediabetes: Secondary | ICD-10-CM

## 2017-06-29 DIAGNOSIS — G43109 Migraine with aura, not intractable, without status migrainosus: Secondary | ICD-10-CM | POA: Diagnosis not present

## 2017-06-29 DIAGNOSIS — J4530 Mild persistent asthma, uncomplicated: Secondary | ICD-10-CM

## 2017-06-29 DIAGNOSIS — E669 Obesity, unspecified: Secondary | ICD-10-CM

## 2017-06-29 DIAGNOSIS — J45991 Cough variant asthma: Secondary | ICD-10-CM | POA: Diagnosis not present

## 2017-06-29 MED ORDER — GABAPENTIN 300 MG PO CAPS: 300.0000 mg | ORAL_CAPSULE | Freq: Every day | ORAL | 0 refills | Status: DC

## 2017-06-29 MED ORDER — OLMESARTAN-AMLODIPINE-HCTZ 20-5-12.5 MG PO TABS: 1.0000 | ORAL_TABLET | Freq: Every day | ORAL | 1 refills | Status: DC

## 2017-06-29 MED ORDER — BUDESONIDE-FORMOTEROL FUMARATE 80-4.5 MCG/ACT IN AERO: 2.0000 | INHALATION_SPRAY | Freq: Two times a day (BID) | RESPIRATORY_TRACT | 5 refills | Status: DC

## 2017-06-29 NOTE — Progress Notes (Signed)
Name: Tammy Swanson   MRN: [id number]    DOB: May 15, 1966   Date:06/29/2017       Progress Note  Subjective  Chief Complaint  Chief Complaint  Patient presents with  . Hypertension    3 month follow up  . Obesity  . Asthma    HPI  HTN: She was Benicar HCTZ 40/12.5, but bp was elevated.  She used to take Tribenzor but states it caused cramps and bp was low. We started Exforge back 01/2017. No cramps with Exforge HCTZ but has noticed a headache two hours after she takes medication, we will try lower dose of Tribenzor since Valsartan was recalled.. BP is in the 120's level.  History of carpal tunnel release: done by Dr. Rudene Christians 12/06/2016, tried stopping Gabapentin but it causes pain on right thenar area , she is down to at most once a day medication and is doing well  Asthma Cough Variant: she is having to clear her throat still but no longer wheezing and cough has decreased to at most a couple of times a week since she has been on Symbicort. She refuses spirometry today   Migraine: she sees neurologist , Dr. Brett Fairy, and she stopped taking Imitrex, she is doing okay, still has some neck pain intermittently but not as bad or frequently.  Obesity: she tried Belviq but did not work, Conservation officer, historic buildings worked well in the past but not covered by insurance,we started her on Trulicity 32/4401 and she lost 16 lbs since started. She states medication curbs her appetite. She is feeling well on medication she states it curbs her appetite. She is trying to eat healthy, we will recheck labs.   Pre-diabetes: she denies polyphagia or polydipsia but has urinary frequency.  Patient Active Problem List   Diagnosis Date Noted  . History of carpal tunnel surgery of right wrist 12/06/2016  . Prediabetes 10/15/2016  . Asthma, mild intermittent 08/24/2016  . History of liver failure 08/24/2016  . Migraine without aura and without status migrainosus, not intractable 09/10/2015  . Eczema 09/10/2015  . Bad  memory 06/01/2015  . Menopause 06/01/2015  . Cough variant asthma 06/01/2015  . Obesity (BMI 30-39.9) 06/01/2015  . Nodule, subcutaneous 06/01/2015  . Lesion of ulnar nerve 06/01/2015  . Vitiligo 06/01/2015  . Benign hypertension 09/03/2010    Past Surgical History:  Procedure Laterality Date  . ABDOMINAL EXPLORATION SURGERY     x3  . ABDOMINAL HYSTERECTOMY  2013  . ABDOMINAL HYSTERECTOMY  2014  . CARPAL TUNNEL RELEASE Right 12/06/2016   Procedure: CARPAL TUNNEL RELEASE;  Surgeon: Hessie Knows, MD;  Location: [facility] ORS;  Service: Orthopedics;  Laterality: Right;  . CHOLECYSTECTOMY    . FOOT SURGERY    . FOOT SURGERY Left 2007/2009   Dr. Elvina Mattes  . KNEE ARTHROSCOPY Left 1985  . KNEE SURGERY Left   . MANDIBLE FRACTURE SURGERY    . NASAL SINUS SURGERY    . NASAL SINUS SURGERY  2012  . RECONSTRUCTION MANDIBLE / MAXILLA  1992  . TONSILLECTOMY    . TONSILLECTOMY  1981  . TRIGGER FINGER RELEASE      Family History  Problem Relation Age of Onset  . Hypertension Mother   . Cancer Mother        Breast   . HIV/AIDS Mother   . Breast cancer Mother   . Cancer Father   . Hypertension Sister   . Cancer Maternal Aunt        3 sisters with Breast  Cancer  . Breast cancer Maternal Aunt   . Cancer Paternal Aunt   . Cancer Paternal Uncle   . Hypertension Maternal Grandmother   . Hypertension Maternal Grandfather   . Breast cancer Maternal Aunt     Social History   Social History  . Marital status: Married    Spouse name: Gaspar Bidding   . Number of children: N/A  . Years of education: N/A   Occupational History  . lab analyst  [employer]   Social History Main Topics  . Smoking status: Never Smoker  . Smokeless tobacco: Never Used  . Alcohol use Yes     Comment: occasionally  . Drug use: No  . Sexual activity: Yes    Partners: Male     Comment: Hysterectomy   Other Topics Concern  . 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  albuterol (VENTOLIN HFA) 108 (90 Base) MCG/ACT inhaler, Inhale 2 puffs into the lungs every 6 (six) hours as needed for wheezing or shortness of breath., Disp: , Rfl:  .  budesonide-formoterol (SYMBICORT) 80-4.5 MCG/ACT inhaler, Inhale 2 puffs into the lungs 2 (two) times daily., Disp: 1 Inhaler, Rfl: 5 .  cholecalciferol (VITAMIN D) 1000 units tablet, Take 1,000 Units by mouth daily., Disp: , Rfl:  .  Cyanocobalamin (B-12) 1000 MCG SUBL, Place 1 tablet under the tongue daily., Disp: , Rfl:  .  Dulaglutide (TRULICITY) 1.5 NF/6.2ZH SOPN, Inject 1.5 mg into the skin once a week., Disp: 12 pen, Rfl: 1 .  gabapentin (NEURONTIN) 300 MG capsule, Take 1 capsule (300 mg total) by mouth at bedtime., Disp: 90 capsule, Rfl: 0 .  Olmesartan-Amlodipine-HCTZ 20-5-12.5 MG TABS, Take 1 tablet by mouth daily., Disp: 90 tablet, Rfl: 1 .  tacrolimus (PROTOPIC) 0.1 % ointment, Apply 1 application topically daily., Disp: 300 g, Rfl: 1  Allergies  Allergen Reactions  . Latex Anaphylaxis  . Vicodin [Hydrocodone-Acetaminophen] Hives     ROS  Constitutional: Negative for fever or significant weight change.  Respiratory: Negative for cough and shortness of breath.   Cardiovascular: Negative for chest pain or palpitations.  Gastrointestinal: Negative for abdominal pain, no bowel changes.  Musculoskeletal: Negative for gait problem or joint swelling.  Skin: Negative for rash.  Neurological: Negative for dizziness or headache.  No other specific complaints in a complete review of systems (except as listed in HPI above).  Objective  Vitals:   06/29/17 0906  BP: 122/78  Pulse: (!) 101  Resp: 18  Temp: 98 F (36.7 C)  SpO2: 98%  Weight: 194 lb 8 oz (88.2 kg)  Height: 5\' 2"  (1.575 m)    Body mass index is 35.57 kg/m.  Physical Exam  Constitutional: Patient appears well-developed and well-nourished. Obese  No distress.  HEENT: head atraumatic,  normocephalic, pupils equal and reactive to light, neck supple, throat within normal limits Cardiovascular: Normal rate, regular rhythm and normal heart sounds.  No murmur heard. No BLE edema. Pulmonary/Chest: Effort normal and breath sounds normal. No respiratory distress. Abdominal: Soft.  There is no tenderness. Psychiatric: Patient has a normal mood and affect. behavior is normal. Judgment and thought content normal.   PHQ2/9: Depression screen [facility] 2/9 03/29/2017 11/14/2016 08/24/2016 09/10/2015 06/04/2015  Decreased Interest 0 0 0 0 0  Down, Depressed, Hopeless 0 0 0 0 0  PHQ - 2 Score 0 0 0 0 0  Fall Risk: Fall Risk  03/29/2017 11/14/2016 08/24/2016 09/10/2015 06/04/2015  Falls in the past year? No No No No Yes  Number falls in past yr: - - - - 1  Injury with Fall? - - - - No     Assessment & Plan  1. Essential hypertension  We will change from Valsartan  - Olmesartan-Amlodipine-HCTZ 20-5-12.5 MG TABS; Take 1 tablet by mouth daily.  Dispense: 90 tablet; Refill: 1 - CBC With Differential - Comp. Metabolic Panel (12)  2. Obesity (BMI 30-39.9)  Discussed with the patient the risk posed by an increased BMI. Discussed importance of portion control, calorie counting and at least 150 minutes of physical activity weekly. Avoid sweet beverages and drink more water. Eat at least 6 servings of fruit and vegetables daily  - Insulin, 2 Hour - Hemoglobin A1c - VITAMIN D 25 Hydroxy (Vit-D Deficiency, Fractures) - TSH - Vitamin B12  3. Prediabetes  - Insulin, 2 Hour - Hemoglobin A1c  4. Migraine with aura and without status migrainosus, not intractable   5. Cough variant asthma  Continue Symbicort   6. History of carpal tunnel surgery of right wrist  She is weaning self off Gabapentin - gabapentin (NEURONTIN) 300 MG capsule; Take 1 capsule (300 mg total) by mouth at bedtime.  Dispense: 90 capsule; Refill: 0  7. Mild persistent asthma without complication  -  budesonide-formoterol (SYMBICORT) 80-4.5 MCG/ACT inhaler; Inhale 2 puffs into the lungs 2 (two) times daily.  Dispense: 1 Inhaler; Refill: 5  8. Lipid screening  - Lipid panel

## 2017-07-06 LAB — COMP. METABOLIC PANEL (12)
A/G RATIO: 1.5 (ref 1.2–2.2)
AST: 19 IU/L (ref 0–40)
Albumin: 4.6 g/dL (ref 3.5–5.5)
Alkaline Phosphatase: 113 IU/L (ref 39–117)
BUN / CREAT RATIO: 16 (ref 9–23)
BUN: 13 mg/dL (ref 6–24)
Bilirubin Total: 0.3 mg/dL (ref 0.0–1.2)
CALCIUM: 10.2 mg/dL (ref 8.7–10.2)
CHLORIDE: 103 mmol/L (ref 96–106)
CREATININE: 0.83 mg/dL (ref 0.57–1.00)
GFR, EST AFRICAN AMERICAN: 94 mL/min/{1.73_m2} (ref >59)
GFR, EST NON AFRICAN AMERICAN: 81 mL/min/{1.73_m2} (ref >59)
Globulin, Total: 3.1 g/dL (ref 1.5–4.5)
Glucose: 95 mg/dL (ref 65–99)
Potassium: 4.7 mmol/L (ref 3.5–5.2)
Sodium: 144 mmol/L (ref 134–144)
TOTAL PROTEIN: 7.7 g/dL (ref 6.0–8.5)

## 2017-07-06 LAB — CBC WITH DIFFERENTIAL
BASOS ABS: 0 10*3/uL (ref 0.0–0.2)
Basos: 0 %
EOS (ABSOLUTE): 0.1 10*3/uL (ref 0.0–0.4)
EOS: 1 %
HEMATOCRIT: 43.2 % (ref 34.0–46.6)
Hemoglobin: 14.3 g/dL (ref 11.1–15.9)
IMMATURE GRANULOCYTES: 0 %
Immature Grans (Abs): 0 10*3/uL (ref 0.0–0.1)
LYMPHS ABS: 1.9 10*3/uL (ref 0.7–3.1)
Lymphs: 27 %
MCH: 27.7 pg (ref 26.6–33.0)
MCHC: 33.1 g/dL (ref 31.5–35.7)
MCV: 84 fL (ref 79–97)
MONOS ABS: 0.5 10*3/uL (ref 0.1–0.9)
Monocytes: 7 %
Neutrophils Absolute: 4.6 10*3/uL (ref 1.4–7.0)
Neutrophils: 65 %
RBC: 5.17 x10E6/uL (ref 3.77–5.28)
RDW: 14.8 % (ref 12.3–15.4)
WBC: 7.2 10*3/uL (ref 3.4–10.8)

## 2017-07-06 LAB — TSH: TSH: 1.23 u[IU]/mL (ref 0.450–4.500)

## 2017-07-06 LAB — LIPID PANEL
CHOLESTEROL TOTAL: 183 mg/dL (ref 100–199)
Chol/HDL Ratio: 3 ratio (ref 0.0–4.4)
HDL: 61 mg/dL (ref >39)
LDL CALC: 109 mg/dL — AB (ref 0–99)
Triglycerides: 66 mg/dL (ref 0–149)
VLDL CHOLESTEROL CAL: 13 mg/dL (ref 5–40)

## 2017-07-06 LAB — HEMOGLOBIN A1C
ESTIMATED AVERAGE GLUCOSE: 117 mg/dL
Hgb A1c MFr Bld: 5.7 % — ABNORMAL HIGH (ref 4.8–5.6)

## 2017-07-06 LAB — INSULIN, 2 HOUR: Insulin, 2 hour: 17.6 u[IU]/mL (ref 0.0–145.4)

## 2017-07-06 LAB — VITAMIN D 25 HYDROXY (VIT D DEFICIENCY, FRACTURES): Vit D, 25-Hydroxy: 43.1 ng/mL (ref 30.0–100.0)

## 2017-07-06 LAB — VITAMIN B12: VITAMIN B 12: 901 pg/mL (ref 232–1245)

## 2017-09-29 ENCOUNTER — Other Ambulatory Visit: Payer: Self-pay | Admitting: Family Medicine

## 2017-09-29 DIAGNOSIS — Z9889 Other specified postprocedural states: Secondary | ICD-10-CM

## 2017-10-02 ENCOUNTER — Ambulatory Visit (INDEPENDENT_AMBULATORY_CARE_PROVIDER_SITE_OTHER): Payer: Commercial Managed Care - PPO | Admitting: Family Medicine

## 2017-10-02 ENCOUNTER — Encounter: Payer: Self-pay | Admitting: Family Medicine

## 2017-10-02 VITALS — BP 120/90 | HR 88 | Resp 14 | Ht 62.0 in | Wt 199.2 lb

## 2017-10-02 DIAGNOSIS — J45991 Cough variant asthma: Secondary | ICD-10-CM

## 2017-10-02 DIAGNOSIS — I10 Essential (primary) hypertension: Secondary | ICD-10-CM

## 2017-10-02 DIAGNOSIS — Z23 Encounter for immunization: Secondary | ICD-10-CM | POA: Diagnosis not present

## 2017-10-02 DIAGNOSIS — J4530 Mild persistent asthma, uncomplicated: Secondary | ICD-10-CM

## 2017-10-02 DIAGNOSIS — R7303 Prediabetes: Secondary | ICD-10-CM | POA: Diagnosis not present

## 2017-10-02 DIAGNOSIS — E669 Obesity, unspecified: Secondary | ICD-10-CM | POA: Diagnosis not present

## 2017-10-02 DIAGNOSIS — G43109 Migraine with aura, not intractable, without status migrainosus: Secondary | ICD-10-CM

## 2017-10-02 LAB — POCT GLYCOSYLATED HEMOGLOBIN (HGB A1C): HEMOGLOBIN A1C: 5.6

## 2017-10-02 MED ORDER — ALBUTEROL SULFATE HFA 108 (90 BASE) MCG/ACT IN AERS: 2.0000 | INHALATION_SPRAY | Freq: Four times a day (QID) | RESPIRATORY_TRACT | 2 refills | Status: DC | PRN

## 2017-10-02 MED ORDER — SEMAGLUTIDE(0.25 OR 0.5MG/DOS) 2 MG/1.5ML ~~LOC~~ SOPN: 0.5000 mg | PEN_INJECTOR | SUBCUTANEOUS | 2 refills | Status: DC

## 2017-10-02 MED ORDER — HYDROCHLOROTHIAZIDE 12.5 MG PO TABS: 12.5000 mg | ORAL_TABLET | Freq: Every day | ORAL | 3 refills | Status: DC

## 2017-10-02 NOTE — Progress Notes (Signed)
Name: Tammy Swanson   MRN: [id number]    DOB: September 23, 1966   Date:10/02/2017       Progress Note  Subjective  Chief Complaint  Chief Complaint  Patient presents with  . Medication Refill    3 month F/U  . Hypertension    Blood pressure medication is giving her headaches.  . Obesity    She thinks that the trulicity is causing her hotflashes.  . Asthma  . Prediabetes    HPI  HTN: She was Benicar HCTZ 40/12.5, but bp was elevated. She used to take Tribenzor but states it caused cramps and bp was low. We started Exforge back 01/2017. No cramps with Exforge HCTZ but has noticed a headache two hours after she takes medication, she is back on Benicar hctz, but has not taken medication for the past 3 days and bp is 120/90. We will try HCTZ only.   History of carpal tunnel release: done by Dr. Rudene Christians 12/06/2016, tried stopping Gabapentin but it causes pain on right thenar area , she is down to at most once a day medication and is doing well  Asthma Cough Variant: she is having to clear her throat still but no longer wheezing and cough has decreased to at most once a week, doing well on  Symbicort.   Migraine: she sees neurologist , Dr. Brett Fairy, and she stopped taking Imitrex, she is doing okay, still has some neck pain intermittently but not as bad or frequently. She states that she has not taken Benicar HCTZ because it was causing throbbing sensation on left temporal area, also with photophobia. She states no symptoms when she does not bp medication, we will switch from Benicar hctz to HCTZ only and consider adding to beta-blocker  Obesity: she tried Belviq but did not work, Conservation officer, historic buildings worked well in the past but not covered by insurance,we started her on Trulicity 61/4431 and had  lost 16 lbs but increased 4 lbs since last visit.. She states medication not longer  curbs her appetite, we will try switching Ozempic .   Pre-diabetes: she denies polyphagia or polydipsia but has urinary  frequency. She is on Trulicity, but is gaining weight again. Also feels like it is causing hot flashes. hgbA1C today is 5.6%   Patient Active Problem List   Diagnosis Date Noted  . History of carpal tunnel surgery of right wrist 12/06/2016  . Prediabetes 10/15/2016  . Asthma, mild intermittent 08/24/2016  . History of liver failure 08/24/2016  . Migraine without aura and without status migrainosus, not intractable 09/10/2015  . Eczema 09/10/2015  . Bad memory 06/01/2015  . Menopause 06/01/2015  . Cough variant asthma 06/01/2015  . Obesity (BMI 30-39.9) 06/01/2015  . Nodule, subcutaneous 06/01/2015  . Lesion of ulnar nerve 06/01/2015  . Vitiligo 06/01/2015  . Benign hypertension 09/03/2010    Past Surgical History:  Procedure Laterality Date  . ABDOMINAL EXPLORATION SURGERY     x3  . ABDOMINAL HYSTERECTOMY  2013  . ABDOMINAL HYSTERECTOMY  2014  . CARPAL TUNNEL RELEASE Right 12/06/2016   Procedure: CARPAL TUNNEL RELEASE;  Surgeon: Hessie Knows, MD;  Location: [facility] ORS;  Service: Orthopedics;  Laterality: Right;  . CHOLECYSTECTOMY    . FOOT SURGERY    . FOOT SURGERY Left 2007/2009   Dr. Elvina Mattes  . KNEE ARTHROSCOPY Left 1985  . KNEE SURGERY Left   . MANDIBLE FRACTURE SURGERY    . NASAL SINUS SURGERY    . NASAL SINUS SURGERY  2012  .  RECONSTRUCTION MANDIBLE / MAXILLA  1992  . TONSILLECTOMY    . TONSILLECTOMY  1981  . TRIGGER FINGER RELEASE      Family History  Problem Relation Age of Onset  . Hypertension Mother   . Cancer Mother        Breast   . HIV/AIDS Mother   . Breast cancer Mother   . Cancer Father   . Hypertension Sister   . Cancer Maternal Aunt        3 sisters with Breast Cancer  . Breast cancer Maternal Aunt   . Cancer Paternal Aunt   . Cancer Paternal Uncle   . Hypertension Maternal Grandmother   . Hypertension Maternal Grandfather   . Breast cancer Maternal Aunt     Social History   Social History  . Marital status: Married    Spouse name:  Gaspar Bidding   . Number of children: N/A  . Years of education: N/A   Occupational History  . lab analyst  [employer]   Social History Main Topics  . Smoking status: Never Smoker  . Smokeless tobacco: Never Used  . Alcohol use Yes     Comment: occasionally  . Drug use: No  . Sexual activity: Yes    Partners: Male     Comment: Hysterectomy   Other Topics Concern  . Not on file   Social History Narrative   She is a Chiropractor for [employer]        Current Outpatient Prescriptions:  .  albuterol (VENTOLIN HFA) 108 (90 Base) MCG/ACT inhaler, Inhale 2 puffs into the lungs every 6 (six) hours as needed for wheezing or shortness of breath., Disp: 1 Inhaler, Rfl: 2 .  budesonide-formoterol (SYMBICORT) 80-4.5 MCG/ACT inhaler, Inhale 2 puffs into the lungs 2 (two) times daily., Disp: 1 Inhaler, Rfl: 5 .  cholecalciferol (VITAMIN D) 1000 units tablet, Take 1,000 Units by mouth daily., Disp: , Rfl:  .  Cyanocobalamin (B-12) 1000 MCG SUBL, Place 1 tablet under the tongue daily., Disp: , Rfl:  .  Dulaglutide (TRULICITY) 1.5 NL/9.7QB SOPN, Inject 1.5 mg into the skin once a week., Disp: 12 pen, Rfl: 1 .  gabapentin (NEURONTIN) 300 MG capsule, TAKE ONE CAPSULE BY MOUTH AT BEDTIME, Disp: 90 capsule, Rfl: 0 .  tacrolimus (PROTOPIC) 0.1 % ointment, Apply 1 application topically daily., Disp: 300 g, Rfl: 1 .  hydrochlorothiazide (HYDRODIURIL) 12.5 MG tablet, Take 1 tablet (12.5 mg total) by mouth daily., Disp: 90 tablet, Rfl: 3 .  Semaglutide (OZEMPIC) 0.25 or 0.5 MG/DOSE SOPN, Inject 0.5 mg into the skin once a week., Disp: 2 pen, Rfl: 2  Allergies  Allergen Reactions  . Latex Anaphylaxis  . Vicodin [Hydrocodone-Acetaminophen] Hives     ROS  Constitutional: Negative for fever , positive for mild  weight change.  Respiratory: Negative for cough and shortness of breath.   Cardiovascular: Negative for chest pain or palpitations.  Gastrointestinal: Negative for abdominal pain, no bowel changes.   Musculoskeletal: Negative for gait problem or joint swelling.  Skin: Negative for rash.  Neurological: Negative for dizziness or headache.  No other specific complaints in a complete review of systems (except as listed in HPI above).  Objective  Vitals:   10/02/17 0931  BP: 120/90  Pulse: 88  Resp: 14  SpO2: 98%  Weight: 199 lb 3.2 oz (90.4 kg)  Height: 5\' 2"  (1.575 m)    Body mass index is 36.43 kg/m.  Physical Exam  Constitutional: Patient appears well-developed and well-nourished.  Obese No distress.  HEENT: head atraumatic, normocephalic, pupils equal and reactive to light,  neck supple, throat within normal limits Cardiovascular: Normal rate, regular rhythm and normal heart sounds.  No murmur heard. No BLE edema. Pulmonary/Chest: Effort normal and breath sounds normal. No respiratory distress. Abdominal: Soft.  There is no tenderness. Psychiatric: Patient has a normal mood and affect. behavior is normal. Judgment and thought content normal.  Recent Results (from the past 2160 hour(s))  POCT HgB A1C     Status: Abnormal   Collection Time: 10/02/17  9:30 AM  Result Value Ref Range   Hemoglobin A1C 5.6       PHQ2/9: Depression screen [facility] 2/9 03/29/2017 11/14/2016 08/24/2016 09/10/2015 06/04/2015  Decreased Interest 0 0 0 0 0  Down, Depressed, Hopeless 0 0 0 0 0  PHQ - 2 Score 0 0 0 0 0     Fall Risk: Fall Risk  10/02/2017 03/29/2017 11/14/2016 08/24/2016 09/10/2015  Falls in the past year? No No No No No  Number falls in past yr: - - - - -  Injury with Fall? - - - - -     Functional Status Survey: Is the patient deaf or have difficulty hearing?: No Does the patient have difficulty seeing, even when wearing glasses/contacts?: No Does the patient have difficulty concentrating, remembering, or making decisions?: No Does the patient have difficulty walking or climbing stairs?: No Does the patient have difficulty dressing or bathing?: No Does the patient have  difficulty doing errands alone such as visiting a doctor's office or shopping?: No    Assessment & Plan  1. Prediabetes  - POCT HgB A1C - Semaglutide (OZEMPIC) 0.25 or 0.5 MG/DOSE SOPN; Inject 0.5 mg into the skin once a week.  Dispense: 2 pen; Refill: 2  2. Benign hypertension  - hydrochlorothiazide (HYDRODIURIL) 12.5 MG tablet; Take 1 tablet (12.5 mg total) by mouth daily.  Dispense: 90 tablet; Refill: 3  3. Need for immunization against influenza  - Flu Vaccine QUAD 36+ mos IM  4. Obesity (BMI 30-39.9)  We will switch from Trulicity to Ozempic, no longer curbing appetite and she thinks Trulicity is causing hot flashes.  - Semaglutide (OZEMPIC) 0.25 or 0.5 MG/DOSE SOPN; Inject 0.5 mg into the skin once a week.  Dispense: 2 pen; Refill: 2  5. Mild persistent asthma without complication  - albuterol (VENTOLIN HFA) 108 (90 Base) MCG/ACT inhaler; Inhale 2 puffs into the lungs every 6 (six) hours as needed for wheezing or shortness of breath.  Dispense: 1 Inhaler; Refill: 2  6. Migraine with aura and without status migrainosus, not intractable  Explained likely not related to bp medication but we will change and monitor  7. Cough variant asthma  She still has Symbicort

## 2017-10-16 ENCOUNTER — Other Ambulatory Visit: Payer: Self-pay | Admitting: Family Medicine

## 2017-10-16 DIAGNOSIS — R7303 Prediabetes: Secondary | ICD-10-CM

## 2017-10-16 DIAGNOSIS — E669 Obesity, unspecified: Secondary | ICD-10-CM

## 2017-12-11 ENCOUNTER — Encounter: Payer: Self-pay | Admitting: Family Medicine

## 2017-12-11 ENCOUNTER — Ambulatory Visit: Payer: Commercial Managed Care - PPO | Admitting: Family Medicine

## 2017-12-11 VITALS — BP 110/62 | HR 100 | Temp 97.8°F | Resp 14 | Ht 62.0 in | Wt 195.3 lb

## 2017-12-11 DIAGNOSIS — R05 Cough: Secondary | ICD-10-CM | POA: Diagnosis not present

## 2017-12-11 DIAGNOSIS — J0140 Acute pansinusitis, unspecified: Secondary | ICD-10-CM | POA: Diagnosis not present

## 2017-12-11 DIAGNOSIS — R059 Cough, unspecified: Secondary | ICD-10-CM

## 2017-12-11 DIAGNOSIS — J029 Acute pharyngitis, unspecified: Secondary | ICD-10-CM

## 2017-12-11 LAB — POCT INFLUENZA A/B
Influenza A, POC: NEGATIVE
Influenza B, POC: NEGATIVE

## 2017-12-11 MED ORDER — BENZONATATE 100 MG PO CAPS: 100.0000 mg | ORAL_CAPSULE | Freq: Two times a day (BID) | ORAL | 0 refills | Status: DC | PRN

## 2017-12-11 MED ORDER — AMOXICILLIN-POT CLAVULANATE 875-125 MG PO TABS: 1.0000 | ORAL_TABLET | Freq: Two times a day (BID) | ORAL | 0 refills | Status: DC

## 2017-12-11 NOTE — Progress Notes (Signed)
Name: Tammy Swanson   MRN: [id number]    DOB: [birth year]   Date:12/11/2017       Progress Note  Subjective  Chief Complaint  Chief Complaint  Patient presents with  . Cough    Onset-5 days, fatigue, bilateral ear pressure, cough-greenish/brown mucus, sore throat, drainage, runny nose, congestion. Has tried Mucinex Sinus and Congestion and Benadryl with no relief.    Sore Throat   This is a new problem. The current episode started in the past 7 days (4 days ago). There has been no fever. Associated symptoms include congestion (chest and throat congestion.), coughing (when she lies down), ear pain, headaches, a plugged ear sensation and trouble swallowing. She has had no exposure to strep. Exposure to: husband has been diagnosed with bronchitis. . Treatments tried: Mucinex Sinus congestion and Benadryl.     Past Medical History:  Diagnosis Date  . Anxiety   . Arthritis    KNEE-LEFT  . Asthma    WELL-CONTROLLED  . Complication of anesthesia    ASTHMA ATTACK DURING SURGERY X 3  . Headache    H/O MIGRAINES  . Hypertension   . Liver failure ([place]) 1994   PT HAD GALLSTONES-PT ENDED UP WITH PANCREATITIS AND THEN ENDED UP IN LIVER FAILURE  . Obesity   . Pain in joint involving upper arm   . Pancreatitis   . Paresthesia of thumb of right hand   . PONV (postoperative nausea and vomiting)   . Vitamin D deficiency   . Vitiligo     Past Surgical History:  Procedure Laterality Date  . ABDOMINAL EXPLORATION SURGERY     x3  . ABDOMINAL HYSTERECTOMY  2013  . ABDOMINAL HYSTERECTOMY  2014  . CARPAL TUNNEL RELEASE Right 12/06/2016   Procedure: CARPAL TUNNEL RELEASE;  Surgeon: Hessie Knows, MD;  Location: [facility] ORS;  Service: Orthopedics;  Laterality: Right;  . CHOLECYSTECTOMY    . FOOT SURGERY    . FOOT SURGERY Left 2007/2009   Dr. Elvina Mattes  . KNEE ARTHROSCOPY Left 1985  . KNEE SURGERY Left   . MANDIBLE FRACTURE SURGERY    . NASAL SINUS SURGERY    . NASAL SINUS SURGERY  2012   . RECONSTRUCTION MANDIBLE / MAXILLA  1992  . TONSILLECTOMY    . TONSILLECTOMY  1981  . TRIGGER FINGER RELEASE      Family History  Problem Relation Age of Onset  . Hypertension Mother   . Cancer Mother        Breast   . HIV/AIDS Mother   . Breast cancer Mother   . Cancer Father   . Hypertension Sister   . Cancer Maternal Aunt        3 sisters with Breast Cancer  . Breast cancer Maternal Aunt   . Cancer Paternal Aunt   . Cancer Paternal Uncle   . Hypertension Maternal Grandmother   . Hypertension Maternal Grandfather   . Breast cancer Maternal Aunt     Social History   Socioeconomic History  . Marital status: Married    Spouse name: Gaspar Bidding   . Number of children: Not on file  . Years of education: Not on file  . Highest education level: Not on file  Social Needs  . Financial resource strain: Not on file  . Food insecurity - worry: Not on file  . Food insecurity - inability: Not on file  . Transportation needs - medical: Not on file  . Transportation needs - non-medical: Not on file  Occupational History  . Occupation: Investment banker, corporate: [employer]  Tobacco Use  . Smoking status: Never Smoker  . Smokeless tobacco: Never Used  Substance and Sexual Activity  . Alcohol use: Yes    Comment: occasionally  . Drug use: No  . Sexual activity: Yes    Partners: Male    Comment: Hysterectomy  Other Topics Concern  . 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  albuterol (VENTOLIN HFA) 108 (90 Base) MCG/ACT inhaler, Inhale 2 puffs into the lungs every 6 (six) hours as needed for wheezing or shortness of breath., Disp: 1 Inhaler, Rfl: 2 .  budesonide-formoterol (SYMBICORT) 80-4.5 MCG/ACT inhaler, Inhale 2 puffs into the lungs 2 (two) times daily., Disp: 1 Inhaler, Rfl: 5 .  cholecalciferol (VITAMIN D) 1000 units tablet, Take 1,000 Units by mouth daily., Disp: , Rfl:  .  Cyanocobalamin (B-12)  1000 MCG SUBL, Place 1 tablet under the tongue daily., Disp: , Rfl:  .  hydrochlorothiazide (HYDRODIURIL) 12.5 MG tablet, Take 1 tablet (12.5 mg total) by mouth daily., Disp: 90 tablet, Rfl: 3 .  Semaglutide (OZEMPIC) 0.25 or 0.5 MG/DOSE SOPN, Inject 0.5 mg into the skin once a week., Disp: 2 pen, Rfl: 2 .  tacrolimus (PROTOPIC) 0.1 % ointment, Apply 1 application topically daily., Disp: 300 g, Rfl: 1 .  Dulaglutide (TRULICITY) 1.5 TK/1.6WF SOPN, Inject 1.5 mg into the skin once a week. (Patient not taking: Reported on 12/11/2017), Disp: 12 pen, Rfl: 1 .  gabapentin (NEURONTIN) 300 MG capsule, TAKE ONE CAPSULE BY MOUTH AT BEDTIME (Patient not taking: Reported on 12/11/2017), Disp: 90 capsule, Rfl: 0  Allergies  Allergen Reactions  . Latex Anaphylaxis  . Vicodin [Hydrocodone-Acetaminophen] Hives     Review of Systems  HENT: Positive for congestion (chest and throat congestion.), ear pain and trouble swallowing.   Respiratory: Positive for cough (when she lies down).   Neurological: Positive for headaches.    Objective  Vitals:   12/11/17 1412  BP: 110/62  Pulse: 100  Resp: 14  Temp: 97.8 F (36.6 C)  TempSrc: Oral  SpO2: 94%  Weight: 195 lb 4.8 oz (88.6 kg)  Height: 5\' 2"  (1.575 m)    Physical Exam  Constitutional: She is oriented to person, place, and time and well-developed, well-nourished, and in no distress.  HENT:  Head: Normocephalic and atraumatic.  Right Ear: Tympanic membrane and ear canal normal. No drainage or swelling.  Left Ear: Tympanic membrane and ear canal normal. No drainage or swelling.  Nose: Right sinus exhibits maxillary sinus tenderness and frontal sinus tenderness. Left sinus exhibits maxillary sinus tenderness and frontal sinus tenderness.  Mouth/Throat: Posterior oropharyngeal erythema present.  Rhinorrhea, mucosal inflammation.  Neck: Neck supple.  Cardiovascular: Normal rate, regular rhythm, S1 normal, S2 normal and normal heart sounds.  No  murmur heard. Pulmonary/Chest: Effort normal and breath sounds normal. She has no wheezes. She has no rhonchi.  Neurological: She is alert and oriented to person, place, and time.  Nursing note and vitals reviewed.    Recent Results (from the past 2160 hour(s))  POCT HgB A1C     Status: Abnormal   Collection Time: 10/02/17  9:30 AM  Result Value Ref Range   Hemoglobin A1C 5.6   POCT Influenza A/B     Status: Normal   Collection Time: 12/11/17  2:32 PM  Result Value Ref Range  Influenza A, POC Negative Negative   Influenza B, POC Negative Negative     Assessment & Plan  1. Cough Suspect viral etiology, negative for flu, start on benzonatate - POCT Influenza A/B - benzonatate (TESSALON) 100 MG capsule; Take 1 capsule (100 mg total) by mouth 2 (two) times daily as needed for cough.  Dispense: 20 capsule; Refill: 0  2. Sore throat To a and B were negative - POCT Influenza A/B  3. Acute non-recurrent pansinusitis Based on patient's symptoms and presentation, it is likely that she has pansinusitis, we'll start on antibiotics for treatment - amoxicillin-clavulanate (AUGMENTIN) 875-125 MG tablet; Take 1 tablet by mouth 2 (two) times daily.  Dispense: 20 tablet; Refill: 0   Errik Mitchelle Asad A. [facility] 12/11/2017 2:35 PM

## 2018-01-03 ENCOUNTER — Ambulatory Visit: Payer: Self-pay | Admitting: Family Medicine

## 2018-01-10 ENCOUNTER — Encounter: Payer: Self-pay | Admitting: Family Medicine

## 2018-01-10 ENCOUNTER — Ambulatory Visit: Payer: Commercial Managed Care - PPO | Admitting: Family Medicine

## 2018-01-10 VITALS — BP 120/90 | HR 80 | Temp 98.0°F | Resp 16 | Ht 62.0 in | Wt 193.2 lb

## 2018-01-10 DIAGNOSIS — E669 Obesity, unspecified: Secondary | ICD-10-CM

## 2018-01-10 DIAGNOSIS — F320 Major depressive disorder, single episode, mild: Secondary | ICD-10-CM

## 2018-01-10 DIAGNOSIS — R7303 Prediabetes: Secondary | ICD-10-CM | POA: Diagnosis not present

## 2018-01-10 DIAGNOSIS — I10 Essential (primary) hypertension: Secondary | ICD-10-CM

## 2018-01-10 DIAGNOSIS — J4530 Mild persistent asthma, uncomplicated: Secondary | ICD-10-CM | POA: Diagnosis not present

## 2018-01-10 LAB — POCT GLYCOSYLATED HEMOGLOBIN (HGB A1C)

## 2018-01-10 MED ORDER — SEMAGLUTIDE (1 MG/DOSE) 2 MG/1.5ML ~~LOC~~ SOPN: 1.0000 mg | PEN_INJECTOR | SUBCUTANEOUS | 2 refills | Status: DC

## 2018-01-10 NOTE — Progress Notes (Signed)
Name: Tammy Swanson   MRN: [id number]    DOB: October 25, 1966   Date:01/11/2018       Progress Note  Subjective  Chief Complaint  Chief Complaint  Patient presents with  . Medication Refill  . Hypertension    HPI   HTN: She was taking Benicar HCTZ 40/12.5, but bp was elevated. She used to take Tribenzor but states it caused cramps and bp was low. We started Exforge back 01/2017. No cramps with Exforge HCTZ but has noticed a headache two hours after she takes medication, she is back on Benicar hctz, but has not taken medication for the past 3 days and bp was 120/90, she has been taking just HCTZ since Oct 2018 and she states at home bp has been at goal around 120's/80's. No side effects of medication  History of carpal tunnel release: done by Dr. Rudene Christians 12/06/2016, tried stopping Gabapentin but it causes pain on right thenar area , she is finally off gabapentin and is doing well, has throbbing sensation occasionally and rubs her hand until symptoms resolves  Asthma Cough Variant: doing well on Symbicort, during an URI had severe cough that lasted weeks, she has been using Symbicort twice a week because she has a cough, but she does not want to take it daily, she also has nocturnal wheezing but no SOB.   Migraine: she sees neurologist , Dr. Brett Fairy, and she stopped taking Imitrex, she is doing okay, still has some neck pain intermittently but not as bad or frequently. She states that she has not taken Benicar HCTZ because it was causing throbbing sensation on left temporal area, also with photophobia. 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 She has also enrolled on weight watchers  Pre-diabetes: she denies polyphagia or polydipsia but has urinary frequency. She is on Ozempic now and is doing well   Depression mild : she worried about her son that moved to [place] and he is not happy there, their daughter is depressed, husband is working 3rd shift and she is feeling lonely. She missed her son, they were very close. She has mental fogginess, and feels sad . She does not want medication or therapy at this time. She states at times affects her focus at work. She tries to please everyone but not taking care of herself  Patient Active Problem List   Diagnosis Date Noted  . History of carpal tunnel surgery of right wrist 12/06/2016  . Prediabetes 10/15/2016  . History of liver failure 08/24/2016  . Migraine without aura and without status migrainosus, not intractable 09/10/2015  . Eczema 09/10/2015  . Bad memory 06/01/2015  . Menopause 06/01/2015  . Cough variant asthma 06/01/2015  . Obesity (BMI 30-39.9) 06/01/2015  . Nodule, subcutaneous 06/01/2015  . Lesion of ulnar nerve 06/01/2015  . Vitiligo 06/01/2015  . Benign hypertension 09/03/2010    Past Surgical History:  Procedure Laterality Date  . ABDOMINAL EXPLORATION SURGERY     x3  . ABDOMINAL HYSTERECTOMY  2013  . ABDOMINAL  HYSTERECTOMY  2014  . CARPAL TUNNEL RELEASE Right 12/06/2016   Procedure: CARPAL TUNNEL RELEASE;  Surgeon: Hessie Knows, MD;  Location: [facility] ORS;  Service: Orthopedics;  Laterality: Right;  . CHOLECYSTECTOMY    . FOOT SURGERY    . FOOT SURGERY Left 2007/2009   Dr. Elvina Mattes  . KNEE ARTHROSCOPY Left 1985  . KNEE SURGERY Left   . MANDIBLE FRACTURE SURGERY    . NASAL SINUS SURGERY    . NASAL SINUS SURGERY  2012  . RECONSTRUCTION MANDIBLE / MAXILLA  1992  . TONSILLECTOMY    . TONSILLECTOMY  1981  . TRIGGER FINGER RELEASE      Family History  Problem  Relation Age of Onset  . Hypertension Mother   . Cancer Mother        Breast   . HIV/AIDS Mother   . Breast cancer Mother   . Cancer Father   . Hypertension Sister   . Cancer Maternal Aunt        3 sisters with Breast Cancer  . Breast cancer Maternal Aunt   . Cancer Paternal Aunt   . Cancer Paternal Uncle   . Hypertension Maternal Grandmother   . Hypertension Maternal Grandfather   . Breast cancer Maternal Aunt     Social History   Socioeconomic History  . Marital status: Married    Spouse name: Gaspar Bidding   . Number of children: Not on file  . Years of education: Not on file  . Highest education level: Not on file  Social Needs  . Financial resource strain: Not on file  . Food insecurity - worry: Not on file  . Food insecurity - inability: Not on file  . Transportation needs - medical: Not on file  . Transportation needs - non-medical: Not on file  Occupational History  . Occupation: Investment banker, corporate: [employer]  Tobacco Use  . Smoking status: Never Smoker  . Smokeless tobacco: Never Used  Substance and Sexual Activity  . Alcohol use: Yes    Comment: occasionally  . Drug use: No  . Sexual activity: Yes    Partners: Male    Comment: Hysterectomy  Other Topics Concern  . 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  albuterol (VENTOLIN HFA) 108 (90 Base) MCG/ACT inhaler, Inhale 2 puffs into the lungs every 6 (six) hours as needed for wheezing or shortness of breath., Disp: 1 Inhaler, Rfl: 2 .  budesonide-formoterol (SYMBICORT) 80-4.5 MCG/ACT inhaler, Inhale 2 puffs into the lungs 2 (two) times daily., Disp: 1 Inhaler, Rfl: 5 .  cholecalciferol (VITAMIN D) 1000 units tablet, Take 1,000 Units by mouth daily., Disp: , Rfl:  .  Cyanocobalamin (B-12) 1000 MCG SUBL, Place 1 tablet under the tongue daily., Disp: , Rfl:  .  hydrochlorothiazide (HYDRODIURIL) 12.5 MG tablet, Take 1 tablet (12.5 mg  total) by mouth daily., Disp: 90 tablet, Rfl: 3 .  tacrolimus (PROTOPIC) 0.1 % ointment, Apply 1 application topically daily., Disp: 300 g, Rfl: 1 .  Semaglutide (OZEMPIC) 1 MG/DOSE SOPN, Inject 1 mg into the skin once a week., Disp: 9 mL, Rfl: 2  Allergies  Allergen Reactions  . Latex Anaphylaxis  . Vicodin [Hydrocodone-Acetaminophen] Hives     ROS  Constitutional: Negative for fever , positive for  weight change.  Respiratory: Negative for cough and shortness of breath.   Cardiovascular: Negative for chest pain  or palpitations.  Gastrointestinal: Negative for abdominal pain, no bowel changes.  Musculoskeletal: Negative for gait problem or joint swelling.  Skin: Negative for rash.  Neurological: Negative for dizziness or headache.  No other specific complaints in a complete review of systems (except as listed in HPI above).  Objective  Vitals:   01/10/18 1514 01/10/18 1549  BP: 130/90 120/90  Pulse: 80   Resp: 16   Temp: 98 F (36.7 C)   TempSrc: Oral   SpO2: 97%   Weight: 193 lb 3.2 oz (87.6 kg)   Height: 5\' 2"  (1.575 m)     Body mass index is 35.34 kg/m.  Physical Exam  Constitutional: Patient appears well-developed and well-nourished. Obese  No distress.  HEENT: head atraumatic, normocephalic, pupils equal and reactive to light,  neck supple, throat within normal limits Cardiovascular: Normal rate, regular rhythm and normal heart sounds.  No murmur heard. No BLE edema. Pulmonary/Chest: Effort normal and breath sounds normal. No respiratory distress. Abdominal: Soft.  There is no tenderness. Psychiatric: Patient has a normal mood and affect. behavior is normal. Judgment and thought content normal.  Recent Results (from the past 2160 hour(s))  POCT Influenza A/B     Status: Normal   Collection Time: 12/11/17  2:32 PM  Result Value Ref Range   Influenza A, POC Negative Negative   Influenza B, POC Negative Negative  POCT HgB A1C     Status: Abnormal    Collection Time: 01/10/18  3:37 PM  Result Value Ref Range   Hemoglobin A1C 5.8q     PHQ2/9: Depression screen [facility] 2/9 01/10/2018 12/11/2017 03/29/2017 11/14/2016 08/24/2016  Decreased Interest 0 0 0 0 0  Down, Depressed, Hopeless 3 0 0 0 0  PHQ - 2 Score 3 0 0 0 0  Altered sleeping 2 - - - -  Tired, decreased energy 0 - - - -  Change in appetite 0 - - - -  Feeling bad or failure about yourself  3 - - - -  Trouble concentrating 3 - - - -  Moving slowly or fidgety/restless 1 - - - -  Suicidal thoughts 0 - - - -  PHQ-9 Score 12 - - - -  Difficult doing work/chores Somewhat difficult - - - -     Fall Risk: Fall Risk  12/11/2017 10/02/2017 03/29/2017 11/14/2016 08/24/2016  Falls in the past year? No No No No No  Number falls in past yr: - - - - -  Injury with Fall? - - - - -     Assessment & Plan  1. Obesity (BMI 30-39.9)  Doing well   2. Prediabetes  We will increase dose to help her lose more weight  - Semaglutide (OZEMPIC) 1 MG/DOSE SOPN; Inject 1 mg into the skin once a week.  Dispense: 9 mL; Refill: 2  3. Benign hypertension  Recheck and bring log next visit, she will monitor at home  4. Mild persistent asthma without complication  Advised her to resume Symbicort qhs    5. Mild major depression ([place])  She refuses medication or therapy at this time

## 2018-03-03 ENCOUNTER — Emergency Department: Payer: Commercial Managed Care - PPO

## 2018-03-03 ENCOUNTER — Encounter: Payer: Self-pay | Admitting: *Deleted

## 2018-03-03 ENCOUNTER — Other Ambulatory Visit: Payer: Self-pay

## 2018-03-03 ENCOUNTER — Emergency Department
Admission: EM | Admit: 2018-03-03 | Discharge: 2018-03-03 | Disposition: A | Discharge status: Home / Self Care | Payer: Commercial Managed Care - PPO | Attending: Emergency Medicine | Admitting: Emergency Medicine

## 2018-03-03 DIAGNOSIS — W109XXA Fall (on) (from) unspecified stairs and steps, initial encounter: Secondary | ICD-10-CM | POA: Insufficient documentation

## 2018-03-03 DIAGNOSIS — J45909 Unspecified asthma, uncomplicated: Secondary | ICD-10-CM | POA: Diagnosis not present

## 2018-03-03 DIAGNOSIS — I10 Essential (primary) hypertension: Secondary | ICD-10-CM | POA: Diagnosis not present

## 2018-03-03 DIAGNOSIS — Z79899 Other long term (current) drug therapy: Secondary | ICD-10-CM | POA: Diagnosis not present

## 2018-03-03 DIAGNOSIS — S92154A Nondisplaced avulsion fracture (chip fracture) of right talus, initial encounter for closed fracture: Secondary | ICD-10-CM

## 2018-03-03 DIAGNOSIS — Y929 Unspecified place or not applicable: Secondary | ICD-10-CM | POA: Insufficient documentation

## 2018-03-03 DIAGNOSIS — Y9301 Activity, walking, marching and hiking: Secondary | ICD-10-CM | POA: Insufficient documentation

## 2018-03-03 DIAGNOSIS — Y999 Unspecified external cause status: Secondary | ICD-10-CM | POA: Diagnosis not present

## 2018-03-03 DIAGNOSIS — S99911A Unspecified injury of right ankle, initial encounter: Secondary | ICD-10-CM | POA: Diagnosis present

## 2018-03-03 LAB — COMPREHENSIVE METABOLIC PANEL
ALT: 16 U/L (ref 14–54)
ANION GAP: 11 (ref 5–15)
AST: 23 U/L (ref 15–41)
Albumin: 4.2 g/dL (ref 3.5–5.0)
Alkaline Phosphatase: 99 U/L (ref 38–126)
BUN: 19 mg/dL (ref 6–20)
CHLORIDE: 106 mmol/L (ref 101–111)
CO2: 27 mmol/L (ref 22–32)
Calcium: 9.4 mg/dL (ref 8.9–10.3)
Creatinine, Ser: 0.9 mg/dL (ref 0.44–1.00)
Glucose, Bld: 100 mg/dL — ABNORMAL HIGH (ref 65–99)
POTASSIUM: 4 mmol/L (ref 3.5–5.1)
SODIUM: 144 mmol/L (ref 135–145)
Total Bilirubin: 0.4 mg/dL (ref 0.3–1.2)
Total Protein: 8.1 g/dL (ref 6.5–8.1)

## 2018-03-03 LAB — CBC WITH DIFFERENTIAL/PLATELET
Basophils Absolute: 0.1 10*3/uL (ref 0–0.1)
Basophils Relative: 1 %
EOS ABS: 0.2 10*3/uL (ref 0–0.7)
Eosinophils Relative: 1 %
HEMATOCRIT: 42.6 % (ref 35.0–47.0)
HEMOGLOBIN: 13.7 g/dL (ref 12.0–16.0)
LYMPHS ABS: 2.2 10*3/uL (ref 1.0–3.6)
LYMPHS PCT: 18 %
MCH: 26.7 pg (ref 26.0–34.0)
MCHC: 32.1 g/dL (ref 32.0–36.0)
MCV: 83.1 fL (ref 80.0–100.0)
Monocytes Absolute: 0.9 10*3/uL (ref 0.2–0.9)
Monocytes Relative: 8 %
NEUTROS ABS: 8.9 10*3/uL — AB (ref 1.4–6.5)
NEUTROS PCT: 72 %
Platelets: 358 10*3/uL (ref 150–440)
RBC: 5.12 MIL/uL (ref 3.80–5.20)
RDW: 14.3 % (ref 11.5–14.5)
WBC: 12.3 10*3/uL — AB (ref 3.6–11.0)

## 2018-03-03 LAB — TROPONIN I

## 2018-03-03 MED ORDER — MELOXICAM 15 MG PO TABS: 15.0000 mg | ORAL_TABLET | Freq: Every day | ORAL | 0 refills | Status: DC

## 2018-03-03 MED ORDER — OXYCODONE-ACETAMINOPHEN 5-325 MG PO TABS
1.0000 | ORAL_TABLET | Freq: Once | ORAL | Status: AC
  Administered 2018-03-03: 1 via ORAL
  Filled 2018-03-03: qty 1

## 2018-03-03 NOTE — ED Provider Notes (Signed)
-----------------------------------------   10:46 PM on 03/03/2018 -----------------------------------------  EKG reviewed and interpreted by myself shows normal sinus rhythm at 65 bpm, narrow QRS, normal axis, normal intervals, no ST changes.  Normal EKG.   Harvest Dark, MD 03/03/18 2246

## 2018-03-03 NOTE — ED Triage Notes (Signed)
Pt to ED reporting left knee pain for th past week. Pt felt as though her knee gave out on the stairs today and she twisted her right ankle and fell down the stairs. Pts family reports pt reported dizziness after the fall but pt is adament in triage that she feels okay and did not hit her head. Pt denies having been dizzy during the fall and denies feeling dizzy now.

## 2018-03-03 NOTE — ED Notes (Signed)
Pt states she fell down 8 stairs and injured right foot - pt states she is unable to bear weight on the right foot - noted swelling to top of foot around to outer ankle

## 2018-03-03 NOTE — ED Provider Notes (Signed)
[facility] Emergency Department Provider Note  ____________________________________________  Time seen: Approximately 11:57 PM  I have reviewed the triage vital signs and the nursing notes.   HISTORY  Chief Complaint Fall; Ankle Pain; and Knee Pain    HPI Tammy Swanson is a 52 y.o. female who presents the emergency department complaining of right ankle pain.  Patient reports that she was descending a flight of stairs earlier today when she took a fall.  Patient reports that she does not remember the incident.  She is unsure whether she struck her head or loss consciousness.  Patient did report that she has been having problems with her left knee "giving out" on her the past week.  The patient's family are also on the stairwell, did not visualize her falling but were at her side within 3-4 seconds.  Patient had no loss of consciousness according to the family members.  Patient has a history of previous head injuries with concussions.  She endorses a mild headache but denies any visual changes, neck pain.  Per the family, she has been acting her complete normal self today.  This occurred approximately 12 hours prior to arrival.  Patient's primary complaint is right ankle pain she has been able to bear weight on the ankle but states that throughout the day the pain has increased.  Patient denies any other extremity pain, denies any back pain.  No numbness and tingling.  She has not tried any medications for this complaint prior to arrival.  No other complaints at this time.  Past Medical History:  Diagnosis Date  . Anxiety   . Arthritis    KNEE-LEFT  . Asthma    WELL-CONTROLLED  . Complication of anesthesia    ASTHMA ATTACK DURING SURGERY X 3  . Headache    H/O MIGRAINES  . Hypertension   . Liver failure ([place]) 1994   PT HAD GALLSTONES-PT ENDED UP WITH PANCREATITIS AND THEN ENDED UP IN LIVER FAILURE  . Obesity   . Pain in joint involving upper arm   .  Pancreatitis   . Paresthesia of thumb of right hand   . PONV (postoperative nausea and vomiting)   . Vitamin D deficiency   . Vitiligo     Patient Active Problem List   Diagnosis Date Noted  . History of carpal tunnel surgery of right wrist 12/06/2016  . Prediabetes 10/15/2016  . History of liver failure 08/24/2016  . Migraine without aura and without status migrainosus, not intractable 09/10/2015  . Eczema 09/10/2015  . Bad memory 06/01/2015  . Menopause 06/01/2015  . Cough variant asthma 06/01/2015  . Obesity (BMI 30-39.9) 06/01/2015  . Nodule, subcutaneous 06/01/2015  . Lesion of ulnar nerve 06/01/2015  . Vitiligo 06/01/2015  . Benign hypertension 09/03/2010    Past Surgical History:  Procedure Laterality Date  . ABDOMINAL EXPLORATION SURGERY     x3  . ABDOMINAL HYSTERECTOMY  2013  . ABDOMINAL HYSTERECTOMY  2014  . CARPAL TUNNEL RELEASE Right 12/06/2016   Procedure: CARPAL TUNNEL RELEASE;  Surgeon: Hessie Knows, MD;  Location: [facility] ORS;  Service: Orthopedics;  Laterality: Right;  . CHOLECYSTECTOMY    . FOOT SURGERY    . FOOT SURGERY Left 2007/2009   Dr. Elvina Mattes  . KNEE ARTHROSCOPY Left 1985  . KNEE SURGERY Left   . MANDIBLE FRACTURE SURGERY    . NASAL SINUS SURGERY    . NASAL SINUS SURGERY  2012  . RECONSTRUCTION MANDIBLE / MAXILLA  1992  .  TONSILLECTOMY    . TONSILLECTOMY  1981  . TRIGGER FINGER RELEASE      Prior to Admission medications   Medication Sig Start Date End Date Taking? Authorizing Provider  albuterol (VENTOLIN HFA) 108 (90 Base) MCG/ACT inhaler Inhale 2 puffs into the lungs every 6 (six) hours as needed for wheezing or shortness of breath. 10/02/17 10/02/18  Steele Sizer, MD  budesonide-formoterol (SYMBICORT) 80-4.5 MCG/ACT inhaler Inhale 2 puffs into the lungs 2 (two) times daily. 06/29/17   Steele Sizer, MD  cholecalciferol (VITAMIN D) 1000 units tablet Take 1,000 Units by mouth daily.    [provider]  Cyanocobalamin (B-12) 1000  MCG SUBL Place 1 tablet under the tongue daily.    [provider]  hydrochlorothiazide (HYDRODIURIL) 12.5 MG tablet Take 1 tablet (12.5 mg total) by mouth daily. 10/02/17   Steele Sizer, MD  meloxicam (MOBIC) 15 MG tablet Take 1 tablet (15 mg total) by mouth daily. 03/03/18   Norton Bivins, Charline Bills, PA-C  Semaglutide (OZEMPIC) 1 MG/DOSE SOPN Inject 1 mg into the skin once a week. 01/10/18   Steele Sizer, MD  tacrolimus (PROTOPIC) 0.1 % ointment Apply 1 application topically daily. 09/10/15   Steele Sizer, MD    Allergies Latex and Vicodin [hydrocodone-acetaminophen]  Family History  Problem Relation Age of Onset  . Hypertension Mother   . Cancer Mother        Breast   . HIV/AIDS Mother   . Breast cancer Mother   . Cancer Father   . Hypertension Sister   . Cancer Maternal Aunt        3 sisters with Breast Cancer  . Breast cancer Maternal Aunt   . Cancer Paternal Aunt   . Cancer Paternal Uncle   . Hypertension Maternal Grandmother   . Hypertension Maternal Grandfather   . Breast cancer Maternal Aunt     Social History Social History   Tobacco Use  . Smoking status: Never Smoker  . Smokeless tobacco: Never Used  Substance Use Topics  . Alcohol use: Yes    Comment: occasionally  . Drug use: No     Review of Systems  Constitutional: No fever/chills Eyes: No visual changes.  Cardiovascular: no chest pain. Respiratory: no cough. No SOB. Gastrointestinal: No abdominal pain.  No nausea, no vomiting.  No diarrhea.  No constipation. Musculoskeletal: Positive for right ankle pain/injury Skin: Negative for rash, abrasions, lacerations, ecchymosis. Neurological: Endorses mild headache but denies focal weakness or numbness. 10-point ROS otherwise negative.  ____________________________________________   PHYSICAL EXAM:  VITAL SIGNS: ED Triage Vitals  Enc Vitals Group     BP 03/03/18 2020 (!) 154/99     Pulse Rate 03/03/18 2020 80     Resp 03/03/18 2020 18      Temp 03/03/18 2020 97.9 F (36.6 C)     Temp Source 03/03/18 2020 Oral     SpO2 03/03/18 2020 99 %     Weight 03/03/18 2021 189 lb (85.7 kg)     Height 03/03/18 2021 5\' 2"  (1.575 m)     Head Circumference --      Peak Flow --      Pain Score 03/03/18 2020 9     Pain Loc --      Pain Edu? --      Excl. in [place]? --      Constitutional: Alert and oriented. Well appearing and in no acute distress. Eyes: Conjunctivae are normal. PERRL. EOMI. Head: Atraumatic.  No visible signs of  trauma.  Patient is nontender to palpation of the osseous structures of the skull and face.  No battle signs, raccoon eyes, serosanguineous fluid drainage from the ears or nares. ENT:      Ears:       Nose: No congestion/rhinnorhea.      Mouth/Throat: Mucous membranes are moist.  Neck: No stridor.  No cervical spine tenderness to palpation.  Cardiovascular: Normal rate, regular rhythm. Normal S1 and S2.  Good peripheral circulation. Respiratory: Normal respiratory effort without tachypnea or retractions. Lungs CTAB. Good air entry to the bases with no decreased or absent breath sounds. Musculoskeletal: Full range of motion to all extremities. No gross deformities appreciated.  Edema noted to the right ankle.  No ecchymosis.  Full range of motion to the right ankle.  Patient is very tender to palpation along the anterior talofibular ligament with tenderness over the talus.  No palpable abnormality or deficits.  No other tenderness to palpation over the osseous structures of the ankle and foot.  Dorsalis pedis pulse intact.  Sensation intact all 5 digits. Neurologic:  Normal speech and language. No gross focal neurologic deficits are appreciated.  Cranial nerves II through XII grossly intact. Skin:  Skin is warm, dry and intact. No rash noted. Psychiatric: Mood and affect are normal. Speech and behavior are normal. Patient exhibits appropriate insight and judgement.   ____________________________________________    LABS (all labs ordered are listed, but only abnormal results are displayed)  Labs Reviewed  COMPREHENSIVE METABOLIC PANEL - Abnormal; Notable for the following components:      Result Value   Glucose, Bld 100 (*)    All other components within normal limits  CBC WITH DIFFERENTIAL/PLATELET - Abnormal; Notable for the following components:   WBC 12.3 (*)    Neutro Abs 8.9 (*)    All other components within normal limits  TROPONIN I   ____________________________________________  EKG  ED ECG REPORT I, Charline Bills Triana Coover,  personally viewed and interpreted this ECG.   Date: 03/03/2018  EKG Time: 2225 hrs.  Rate: 65 bpm  Rhythm: normal EKG, normal sinus rhythm, unchanged from previous tracings  Axis: Normal axis  Intervals:none  ST&T Change: No ST elevations or depressions noted  Normal EKG   ____________________________________________  RADIOLOGY Diamantina Providence Jonella Redditt, personally viewed and evaluated these images (plain radiographs) as part of my medical decision making, as well as reviewing the written report by the radiologist.  I concur with radiologist finding of small avulsion fracture with no displacement to the talus.  Otherwise, no acute osseous abnormality.  Dg Ankle Complete Right  Result Date: 03/03/2018 CLINICAL DATA:  Right foot and ankle pain after fall down 8 stairs. Unable to bear weight. Swelling about dorsal foot/ankle. EXAM: RIGHT ANKLE - COMPLETE 3+ VIEW COMPARISON:  None. FINDINGS: Small acute avulsion fracture from the dorsal talus anteriorly. Associated soft tissue edema. No additional acute fracture. The ankle mortise is preserved. Trace tibial talar spurring. There is a plantar calcaneal spur and Achilles tendon enthesophyte. IMPRESSION: Small avulsion fracture from the anterior dorsal talus with associated soft tissue edema. Electronically Signed   By: Jeb Levering M.D.   On: 03/03/2018 21:37   Dg Foot Complete Right  Result Date:  03/03/2018 CLINICAL DATA:  Right foot and ankle pain after fall down 8 stairs. Unable to bear weight. Swelling about dorsal foot/ankle. EXAM: RIGHT FOOT COMPLETE - 3+ VIEW COMPARISON:  None. FINDINGS: Small acute avulsion fracture from the dorsal anterior talus. No additional acute  fracture of the foot. Minimal hammertoe deformity of the digits, alignment is otherwise normal. Prominent plantar calcaneal spur and Achilles tendon enthesophyte. IMPRESSION: Acute avulsion fracture from the dorsal anterior talus with associated soft tissue edema. Electronically Signed   By: Jeb Levering M.D.   On: 03/03/2018 21:39    ____________________________________________    PROCEDURES  Procedure(s) performed:    Procedures    Medications  oxyCODONE-acetaminophen (PERCOCET/ROXICET) 5-325 MG per tablet 1 tablet (1 tablet Oral Given 03/03/18 2352)     ____________________________________________   INITIAL IMPRESSION / ASSESSMENT AND PLAN / ED COURSE  Pertinent labs & imaging results that were available during my care of the patient were reviewed by me and considered in my medical decision making (see chart for details).  Review of the [place] CSRS was performed in accordance of the [place] prior to dispensing any controlled drugs.     Patient's diagnosis is consistent with closed nondisplaced avulsion fracture of the right talus.  Initial differential included ankle sprain, ligament rupture, fracture.  Patient also endorsed not remembering the event.  Differential included head trauma, concussion, skull fracture, intracranial bleed, cardiac dysrhythmia, electrolyte imbalance, syncope.  During the discussion, patient was adamant that she did not want a CT scan of her head.  Patient states that she was claustrophobic.  Even after I offered mild anxiolytics, the patient still adamantly declined any imaging of her head.  Patient is 12 hours status post injury, no changes from baseline per family, patient is  neurologically intact on imaging.  At this time, patient appears capable of making decisions.  At this time no CT is ordered.  They are advised that they may return anytime should they change their mind.  Patient consented to lab work and EKG.  These returned with reassuring results.  At this time, patient will be discharged home.  She has crutches at home and declines new crutches.  Patient is advised to keep an Ace bandage on her foot, take NSAIDs, follow-up with orthopedics for further management.  She verbalizes understanding of same.  Patient has any increase in headache, neurological changes, change from baseline, or wishes to pursue CT she may return at any time.. Patient will be discharged home with prescriptions for meloxicam. Patient is to follow up with orthopedics as needed or otherwise directed. Patient is given ED precautions to return to the ED for any worsening or new symptoms.     ____________________________________________  FINAL CLINICAL IMPRESSION(S) / ED DIAGNOSES  Final diagnoses:  Closed nondisplaced avulsion fracture of right talus, initial encounter      NEW MEDICATIONS STARTED DURING THIS VISIT:  ED Discharge Orders        Ordered    meloxicam (MOBIC) 15 MG tablet  Daily     03/03/18 2319          This chart was dictated using voice recognition software/Dragon. Despite best efforts to proofread, errors can occur which can change the meaning. Any change was purely unintentional.    Darletta Moll, PA-C 03/04/18 0004    Harvest Dark, MD 03/04/18 [phone]

## 2018-03-21 ENCOUNTER — Telehealth: Payer: Self-pay | Admitting: Family Medicine

## 2018-04-17 ENCOUNTER — Ambulatory Visit: Payer: Commercial Managed Care - PPO | Admitting: Family Medicine

## 2018-04-17 ENCOUNTER — Encounter: Payer: Self-pay | Admitting: Family Medicine

## 2018-04-17 VITALS — BP 124/82 | HR 98 | Temp 98.2°F | Resp 16 | Ht 62.0 in | Wt 187.1 lb

## 2018-04-17 DIAGNOSIS — R7303 Prediabetes: Secondary | ICD-10-CM | POA: Diagnosis not present

## 2018-04-17 DIAGNOSIS — I10 Essential (primary) hypertension: Secondary | ICD-10-CM | POA: Diagnosis not present

## 2018-04-17 DIAGNOSIS — J45991 Cough variant asthma: Secondary | ICD-10-CM

## 2018-04-17 DIAGNOSIS — F320 Major depressive disorder, single episode, mild: Secondary | ICD-10-CM | POA: Diagnosis not present

## 2018-04-17 DIAGNOSIS — E669 Obesity, unspecified: Secondary | ICD-10-CM

## 2018-04-17 MED ORDER — BUDESONIDE-FORMOTEROL FUMARATE 80-4.5 MCG/ACT IN AERO: 2.0000 | INHALATION_SPRAY | Freq: Two times a day (BID) | RESPIRATORY_TRACT | 5 refills | Status: DC

## 2018-04-17 MED ORDER — SEMAGLUTIDE (1 MG/DOSE) 2 MG/1.5ML ~~LOC~~ SOPN: 1.0000 mg | PEN_INJECTOR | SUBCUTANEOUS | 2 refills | Status: DC

## 2018-04-17 NOTE — Progress Notes (Signed)
Name: Tammy Swanson   MRN: [id number]    DOB: Aug 10, 1966   Date:04/17/2018       Progress Note  Subjective  Chief Complaint  Chief Complaint  Patient presents with  . Obesity    Has lost 6 pounds since last visit  . Hypertension    Headaches occasionally  . Medication Refill  . Asthma    Has been controlled with Symbicort once at night and cough has been controlled. If she has a tickle or choking sensation will take 2 puffs    HPI  HTN: She was taking Benicar HCTZ 40/12.5, but bp was elevated. She used to take Tribenzor but states it caused cramps and bp was low. We started Exforge back 01/2017. No cramps with Exforge HCTZ but has noticed a headache two hours after she takes medication, she is back on Benicar hctz, but has not taken medication for the past 3 days and bp was 120/90, she has been taking just HCTZ since Oct 2018 and she states at home bp has been at goal around 120's/80's. No side effects of medication and bp is at goal today. Continue medication as prescribed.   Asthma Cough Variant: doing well on Symbicort, wheezing at night has resolved, she has intermittent dry cough. No recent SOB at this time  Migraine: she sees neurologist , Dr. Brett Fairy, and she stopped taking Imitrex, she is doing okay, still has some neck pain intermittently but not as bad or frequently.She states that she has not taken Benicar HCTZ because it was causing throbbing sensation on left temporal area, also with photophobia. Since she stopped Benicar , headaches resolved, only on hctz now. 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  Pre-diabetes: she denies polyphagia or polydipsia  but has urinary frequency.She is on Ozempic now and is doing well, she lost another 5 lbs since last visit. Continue medication. Check labs next visit   Depression mild : she worried about her son that moved to [place] and he is not happy there, their daughter is depressed, husband is working 1st  shift now and is helping her mood   She missed her son, they were very close. She states she is doing better and does not want medications at this time.   Right foot pain: going to [facility] and has a brace  Patient Active Problem List   Diagnosis Date Noted  . History of carpal tunnel surgery of right wrist 12/06/2016  . Prediabetes 10/15/2016  . History of liver failure 08/24/2016  . Migraine without aura and without status migrainosus, not intractable 09/10/2015  . Eczema 09/10/2015  . Bad memory 06/01/2015  . Menopause 06/01/2015  . Cough variant asthma 06/01/2015  . Obesity (BMI 30-39.9) 06/01/2015  . Nodule, subcutaneous 06/01/2015  . Lesion of ulnar nerve 06/01/2015  . Vitiligo 06/01/2015  . Benign hypertension 09/03/2010    Past Surgical History:  Procedure Laterality Date  . ABDOMINAL EXPLORATION SURGERY     x3  . ABDOMINAL HYSTERECTOMY  2013  . ABDOMINAL HYSTERECTOMY  2014  . CARPAL TUNNEL RELEASE Right 12/06/2016   Procedure: CARPAL TUNNEL RELEASE;  Surgeon: Hessie Knows, MD;  Location: [facility]  ORS;  Service: Orthopedics;  Laterality: Right;  . CHOLECYSTECTOMY    . FOOT SURGERY    . FOOT SURGERY Left 2007/2009   Dr. Elvina Mattes  . KNEE ARTHROSCOPY Left 1985  . KNEE SURGERY Left   . MANDIBLE FRACTURE SURGERY    . NASAL SINUS SURGERY    . NASAL SINUS SURGERY  2012  . RECONSTRUCTION MANDIBLE / MAXILLA  1992  . TONSILLECTOMY    . TONSILLECTOMY  1981  . TRIGGER FINGER RELEASE      Family History  Problem Relation Age of Onset  . Hypertension Mother   . Cancer Mother        Breast   . HIV/AIDS Mother   . Breast cancer Mother   . Cancer Father   . Hypertension  Sister   . Cancer Maternal Aunt        3 sisters with Breast Cancer  . Breast cancer Maternal Aunt   . Cancer Paternal Aunt   . Cancer Paternal Uncle   . Hypertension Maternal Grandmother   . Hypertension Maternal Grandfather   . Breast cancer Maternal Aunt     Social History   Socioeconomic History  . Marital status: Married    Spouse name: Gaspar Bidding   . Number of children: Not on file  . Years of education: Not on file  . Highest education level: Not on file  Occupational History  . Occupation: Investment banker, corporate: [employer]  . Financial resource strain: Not on file  . Food insecurity:    Worry: Not on file    Inability: Not on file  . Transportation needs:    Medical: Not on file    Non-medical: Not on file  Tobacco Use  . Smoking status: Never Smoker  . Smokeless tobacco: Never Used  Substance and Sexual Activity  . Alcohol use: Yes    Comment: occasionally  . Drug use: No  . Sexual activity: Yes    Partners: Male    Comment: Hysterectomy  Lifestyle  . Physical activity:    Days per week: Not on file    Minutes per session: Not on file  . Stress: Not on file  Relationships  . Social connections:    Talks on phone: Not on file    Gets together: Not on file    Attends religious service: Not on file    Active member of club or organization: Not on file    Attends meetings of clubs or organizations: Not on file    Relationship status: Not on file  . Intimate partner violence:    Fear of current or ex partner: Not on file    Emotionally abused: Not on file    Physically abused: Not on file    Forced sexual activity: Not on file  Other Topics Concern  . Not on file  Social History Narrative   She is a Chiropractor for [employer]     Current Outpatient Medications:  .  Adapalene-Benzoyl Peroxide (EPIDUO FORTE) 0.3-2.5 % GEL, Epiduo Forte 0.3 %-2.5 % topical gel with pump, Disp: , Rfl:  .  albuterol (VENTOLIN HFA) 108 (90 Base) MCG/ACT  inhaler, Inhale 2 puffs into the lungs every 6 (six) hours as needed for wheezing or shortness of breath., Disp: 1 Inhaler, Rfl: 2 .  budesonide-formoterol (SYMBICORT) 80-4.5 MCG/ACT inhaler, Inhale 2 puffs into the lungs 2 (two) times daily., Disp: 1 Inhaler, Rfl: 5 .  cholecalciferol (VITAMIN D) 1000 units  tablet, Take 1,000 Units by mouth daily., Disp: , Rfl:  .  Cyanocobalamin (B-12) 1000 MCG SUBL, Place 1 tablet under the tongue daily., Disp: , Rfl:  .  cyclobenzaprine (FLEXERIL) 10 MG tablet, Take 10 mg by mouth 3 (three) times daily., Disp: , Rfl: 1 .  hydrochlorothiazide (HYDRODIURIL) 12.5 MG tablet, Take 1 tablet (12.5 mg total) by mouth daily., Disp: 90 tablet, Rfl: 3 .  Semaglutide (OZEMPIC) 1 MG/DOSE SOPN, Inject 1 mg into the skin once a week., Disp: 9 mL, Rfl: 2 .  tacrolimus (PROTOPIC) 0.1 % ointment, Apply 1 application topically daily., Disp: 300 g, Rfl: 1 .  meloxicam (MOBIC) 15 MG tablet, Take 1 tablet (15 mg total) by mouth daily. (Patient not taking: Reported on 04/17/2018), Disp: 30 tablet, Rfl: 0  Allergies  Allergen Reactions  . Latex Anaphylaxis  . Vicodin [Hydrocodone-Acetaminophen] Hives     ROS  Constitutional: Negative for fever or significant weight change.  Respiratory: positive  For intermittent  cough but no  shortness of breath.   Cardiovascular: Negative for chest pain or palpitations.  Gastrointestinal: Negative for abdominal pain, no bowel changes.  Musculoskeletal: Negative for gait problem or joint swelling.  Skin: Negative for rash.  Neurological: Negative for dizziness or headache.  No other specific complaints in a complete review of systems (except as listed in HPI above).  Objective  Vitals:   04/17/18 1544  BP: 124/82  Pulse: 98  Resp: 16  Temp: 98.2 F (36.8 C)  TempSrc: Oral  SpO2: 97%  Weight: 187 lb 1.6 oz (84.9 kg)  Height: 5' 2" (1.575 m)    Body mass index is 34.22 kg/m.  Physical Exam  Constitutional: Patient appears  well-developed and well-nourished. Obese  No distress.  HEENT: head atraumatic, normocephalic, pupils equal and reactive to light,  neck supple, throat within normal limits Cardiovascular: Normal rate, regular rhythm and normal heart sounds.  No murmur heard. No BLE edema. Pulmonary/Chest: Effort normal and breath sounds normal. No respiratory distress. Abdominal: Soft.  There is no tenderness. Psychiatric: Patient has a normal mood and affect. behavior is normal. Judgment and thought content normal.  Recent Results (from the past 2160 hour(s))  Comprehensive metabolic panel     Status: Abnormal   Collection Time: 03/03/18 10:30 PM  Result Value Ref Range   Sodium 144 135 - 145 mmol/L   Potassium 4.0 3.5 - 5.1 mmol/L   Chloride 106 101 - 111 mmol/L   CO2 27 22 - 32 mmol/L   Glucose, Bld 100 (H) 65 - 99 mg/dL   BUN 19 6 - 20 mg/dL   Creatinine, Ser 0.90 0.44 - 1.00 mg/dL   Calcium 9.4 8.9 - 10.3 mg/dL   Total Protein 8.1 6.5 - 8.1 g/dL   Albumin 4.2 3.5 - 5.0 g/dL   AST 23 15 - 41 U/L   ALT 16 14 - 54 U/L   Alkaline Phosphatase 99 38 - 126 U/L   Total Bilirubin 0.4 0.3 - 1.2 mg/dL   GFR calc non Af Amer >60 >60 mL/min   GFR calc Af Amer >60 >60 mL/min    Comment: (NOTE) The eGFR has been calculated using the CKD EPI equation. This calculation has not been validated in all clinical situations. eGFR's persistently <60 mL/min signify possible Chronic Kidney Disease.    Anion gap 11 5 - 15    Comment: Performed at [facility], [address] [place], [place] [address]  CBC with Differential  Status: Abnormal   Collection Time: 03/03/18 10:30 PM  Result Value Ref Range   WBC 12.3 (H) 3.6 - 11.0 K/uL   RBC 5.12 3.80 - 5.20 MIL/uL   Hemoglobin 13.7 12.0 - 16.0 g/dL   HCT 42.6 35.0 - 47.0 %   MCV 83.1 80.0 - 100.0 fL   MCH 26.7 26.0 - 34.0 pg   MCHC 32.1 32.0 - 36.0 g/dL   RDW 14.3 11.5 - 14.5 %   Platelets 358 150 - 440 K/uL   Neutrophils Relative % 72 %    Neutro Abs 8.9 (H) 1.4 - 6.5 K/uL   Lymphocytes Relative 18 %   Lymphs Abs 2.2 1.0 - 3.6 K/uL   Monocytes Relative 8 %   Monocytes Absolute 0.9 0.2 - 0.9 K/uL   Eosinophils Relative 1 %   Eosinophils Absolute 0.2 0 - 0.7 K/uL   Basophils Relative 1 %   Basophils Absolute 0.1 0 - 0.1 K/uL    Comment: Performed at [facility], [address] [place], [place] [address]  Troponin I     Status: None   Collection Time: 03/03/18 10:30 PM  Result Value Ref Range   Troponin I <0.03 <0.03 ng/mL    Comment: Performed at [facility], [address] [place], [place] [address]     PHQ2/9: Depression screen [facility] 2/9 04/17/2018 04/17/2018 01/10/2018 12/11/2017 03/29/2017  Decreased Interest - 0 0 0 0  Down, Depressed, Hopeless - 0 3 0 0  PHQ - 2 Score - 0 3 0 0  Altered sleeping - 3 2 - -  Tired, decreased energy - 0 0 - -  Change in appetite - 0 0 - -  Feeling bad or failure about yourself  - 0 3 - -  Trouble concentrating - 0 3 - -  Moving slowly or fidgety/restless - 0 1 - -  Suicidal thoughts - 0 0 - -  PHQ-9 Score - 3 12 - -  Difficult doing work/chores Not difficult at all Not difficult at all Somewhat difficult - -    Fall Risk: Fall Risk  12/11/2017 10/02/2017 03/29/2017 11/14/2016 08/24/2016  Falls in the past year? _0   Number falls in past yr: - - - - -  Injury with Fall? - - - - -      Assessment & Plan  1. Cough variant asthma  - Spirometry with Graph - budesonide-formoterol (SYMBICORT) 80-4.5 MCG/ACT inhaler; Inhale 2 puffs into the lungs 2 (two) times daily.  Dispense: 1 Inhaler; Refill: 5  2. Prediabetes  - Semaglutide (OZEMPIC) 1 MG/DOSE SOPN; Inject 1 mg into the skin once a week.  Dispense: 9 mL; Refill: 2  3. Cough variant asthma  - Spirometry with Graph - budesonide-formoterol (SYMBICORT) 80-4.5 MCG/ACT inhaler; Inhale 2 puffs into the lungs 2 (two) times daily.  Dispense: 1 Inhaler; Refill: 5  4. Mild persistent asthma without  complication  - budesonide-formoterol (SYMBICORT) 80-4.5 MCG/ACT inhaler; Inhale 2 puffs into the lungs 2 (two) times daily.  Dispense: 1 Inhaler; Refill: 5  5. Prediabetes  - Semaglutide (OZEMPIC) 1 MG/DOSE SOPN; Inject 1 mg into the skin once a week.  Dispense: 9 mL; Refill: 2  6. Obesity (BMI 30-39.9)  Discussed with the patient the risk posed by an increased BMI. Discussed importance of portion control, calorie counting and at least 150 minutes of physical activity weekly. Avoid sweet beverages and drink more water. Eat at least 6 servings of fruit and  vegetables daily   7. Benign hypertension  Doing well on HCTZ, bp is at goal today  8. Mild major depression ([place])  She is doing better, still worries about her son but does not want medication at this time

## 2018-06-11 ENCOUNTER — Other Ambulatory Visit: Payer: Self-pay | Admitting: Family Medicine

## 2018-06-11 DIAGNOSIS — Z1231 Encounter for screening mammogram for malignant neoplasm of breast: Secondary | ICD-10-CM

## 2018-06-29 ENCOUNTER — Ambulatory Visit
Admission: RE | Admit: 2018-06-29 | Discharge: 2018-06-29 | Disposition: A | Discharge status: Home / Self Care | Payer: Commercial Managed Care - PPO | Source: Ambulatory Visit | Attending: Family Medicine | Admitting: Family Medicine

## 2018-06-29 DIAGNOSIS — Z1231 Encounter for screening mammogram for malignant neoplasm of breast: Secondary | ICD-10-CM | POA: Diagnosis not present

## 2018-08-01 ENCOUNTER — Encounter: Payer: Self-pay | Admitting: Family Medicine

## 2018-08-20 ENCOUNTER — Encounter: Payer: Self-pay | Admitting: Family Medicine

## 2018-08-20 ENCOUNTER — Ambulatory Visit: Payer: Commercial Managed Care - PPO | Admitting: Family Medicine

## 2018-08-20 VITALS — BP 152/98 | HR 84 | Temp 98.0°F | Resp 16 | Ht 62.0 in | Wt 191.3 lb

## 2018-08-20 DIAGNOSIS — J4530 Mild persistent asthma, uncomplicated: Secondary | ICD-10-CM

## 2018-08-20 DIAGNOSIS — E669 Obesity, unspecified: Secondary | ICD-10-CM

## 2018-08-20 DIAGNOSIS — I10 Essential (primary) hypertension: Secondary | ICD-10-CM

## 2018-08-20 DIAGNOSIS — R7303 Prediabetes: Secondary | ICD-10-CM

## 2018-08-20 DIAGNOSIS — Z23 Encounter for immunization: Secondary | ICD-10-CM | POA: Diagnosis not present

## 2018-08-20 DIAGNOSIS — G43109 Migraine with aura, not intractable, without status migrainosus: Secondary | ICD-10-CM

## 2018-08-20 DIAGNOSIS — E538 Deficiency of other specified B group vitamins: Secondary | ICD-10-CM

## 2018-08-20 MED ORDER — TELMISARTAN-HCTZ 40-12.5 MG PO TABS: 1.0000 | ORAL_TABLET | Freq: Every day | ORAL | 0 refills | Status: DC

## 2018-08-20 MED ORDER — ATENOLOL 25 MG PO TABS: 25.0000 mg | ORAL_TABLET | Freq: Every day | ORAL | 0 refills | Status: DC

## 2018-08-20 MED ORDER — CITALOPRAM HYDROBROMIDE 20 MG PO TABS: 20.0000 mg | ORAL_TABLET | Freq: Every day | ORAL | 0 refills | Status: DC

## 2018-08-20 NOTE — Progress Notes (Signed)
Name: Tammy Swanson   MRN: [id number]    DOB: 03-Nov-1966   Date:08/20/2018       Progress Note  Subjective  Chief Complaint  Chief Complaint  Patient presents with  . Follow-up  . Obesity  . Cough  . Prediabetes  . Depression  . Hypertension    BP has been all over the place    HPI  HTN: She was takingBenicar HCTZ 40/12.5, but bp was elevated. She used to take Tribenzor but states it caused cramps and bp was low. We started Exforge back 01/2017. No cramps with Exforge HCTZ but has noticed a headache two hours after she takes medication, she was  back on Benicar hctz and while skipping medication bp was 120/90 so we switched to   just HCTZ since Oct 2018 and bp was under control, however at recent office visit at gyn and today in our office bp has been elevated, she has been more anxious than usual, we will try controlling anxiety and try Micardis hctz, monitor bp and monitor for dizziness. Stay hydrated and we will recheck in one month   Asthma Cough Variant:doing well on Symbicort, wheezing at night has resolved, she has intermittent dry cough. No recent SOB at this time. She usually uses symbicort only at night.   Migraine: she sees neurologist , Dr. Brett Fairy, and she stopped taking Imitrex, she is doing okay, still has some neck pain intermittently but not as bad or frequently.She states she fell two weeks ago after a severe headache episode, nuchal area, witnessed by her adult son. She fell backwards and hit the corner of dresser. She did not recall any of the event, she states it may have lasted 2 minutes. It took a few minutes for her to feel back to normal. She did not shake, did not have bowel or bladder incontinence. EMS offered to take her to [facility] but she declined. Advised to go back to see neurologist. She states also had a previous episode in June 2019, but does not recall a headache prior to that episode.   Obesity: she tried Belviq but did not work and caused side  effects, Qysmia worked well in the past but not covered by insurance,we started her on Trulicity 48/5462 andhadlost 16lbsbut after 7 months she started to gain the weight back, so we changed to [place] 09/2017 at a weight of 199lbs and was  down to 187.1 lbs . She is tired of Ozempic and states having polyphagia so stopped medication She asked for Qsymia but bp is high, and we will hold off for now  Pre-diabetes: she denies polyphagia or polydipsia but has urinary frequency.She stopped Ozempic. We will recheck labs   Depression mild : her daughter is going to middle college, she states bp has been high since. She is also anxious, also more stressed than usual, recent episode of syncopal. She stopped paxil recently but willing to try something else. Reviewed phq 9 and GAD 7   Patient Active Problem List   Diagnosis Date Noted  . History of carpal tunnel surgery of right wrist 12/06/2016  . Prediabetes 10/15/2016  . History of liver failure 08/24/2016  . Migraine without aura and without status migrainosus, not intractable 09/10/2015  . Eczema 09/10/2015  . Bad memory 06/01/2015  . Menopause 06/01/2015  . Cough variant asthma 06/01/2015  . Obesity (BMI 30-39.9) 06/01/2015  . Nodule, subcutaneous 06/01/2015  . Lesion of ulnar nerve 06/01/2015  . Vitiligo 06/01/2015  . Benign hypertension 09/03/2010  Past Surgical History:  Procedure Laterality Date  . ABDOMINAL EXPLORATION SURGERY     x3  . ABDOMINAL HYSTERECTOMY  2013  . ABDOMINAL HYSTERECTOMY  2014  . CARPAL TUNNEL RELEASE Right 12/06/2016   Procedure: CARPAL TUNNEL RELEASE;  Surgeon: Hessie Knows, MD;  Location: [facility] ORS;  Service: Orthopedics;  Laterality: Right;  . CHOLECYSTECTOMY    . FOOT SURGERY    . FOOT SURGERY Left 2007/2009   Dr. Elvina Mattes  . KNEE ARTHROSCOPY Left 1985  . KNEE SURGERY Left   . MANDIBLE FRACTURE SURGERY    . NASAL SINUS SURGERY    . NASAL SINUS SURGERY  2012  . RECONSTRUCTION MANDIBLE / MAXILLA   1992  . TONSILLECTOMY    . TONSILLECTOMY  1981  . TRIGGER FINGER RELEASE      Family History  Problem Relation Age of Onset  . Hypertension Mother   . Cancer Mother        Breast   . HIV/AIDS Mother   . Breast cancer Mother   . Cancer Father   . Hypertension Sister   . Cancer Maternal Aunt        3 sisters with Breast Cancer  . Breast cancer Maternal Aunt   . Cancer Paternal Aunt   . Cancer Paternal Uncle   . Hypertension Maternal Grandmother   . Hypertension Maternal Grandfather   . Breast cancer Maternal Aunt     Social History   Socioeconomic History  . Marital status: Married    Spouse name: Gaspar Bidding   . Number of children: 2  . Years of education: Not on file  . Highest education level: Bachelor's degree (e.g., BA, AB, BS)  Occupational History  . Occupation: Investment banker, corporate: [employer]  . Financial resource strain: Somewhat hard  . Food insecurity:    Worry: Never true    Inability: Never true  . Transportation needs:    Medical: No    Non-medical: No  Tobacco Use  . Smoking status: Never Smoker  . Smokeless tobacco: Never Used  Substance and Sexual Activity  . Alcohol use: Yes    Comment: occasionally  . Drug use: No  . Sexual activity: Yes    Partners: Male    Comment: Hysterectomy  Lifestyle  . Physical activity:    Days per week: Not on file    Minutes per session: Not on file  . Stress: Not on file  Relationships  . Social connections:    Talks on phone: Not on file    Gets together: Not on file    Attends religious service: Not on file    Active member of club or organization: Not on file    Attends meetings of clubs or organizations: Not on file    Relationship status: Not on file  . Intimate partner violence:    Fear of current or ex partner: No    Emotionally abused: No    Physically abused: No    Forced sexual activity: No  Other Topics Concern  . Not on file  Social History Narrative   She is a Armed forces technical officer for [employer]     Current Outpatient Medications:  .  Adapalene-Benzoyl Peroxide (EPIDUO FORTE) 0.3-2.5 % GEL, Epiduo Forte 0.3 %-2.5 % topical gel with pump, Disp: , Rfl:  .  albuterol (VENTOLIN HFA) 108 (90 Base) MCG/ACT inhaler, Inhale 2 puffs into the lungs every 6 (six) hours as needed for wheezing or shortness  of breath., Disp: 1 Inhaler, Rfl: 2 .  budesonide-formoterol (SYMBICORT) 80-4.5 MCG/ACT inhaler, Inhale 2 puffs into the lungs 2 (two) times daily., Disp: 1 Inhaler, Rfl: 5 .  cholecalciferol (VITAMIN D) 1000 units tablet, Take 1,000 Units by mouth daily., Disp: , Rfl:  .  Cyanocobalamin (B-12) 1000 MCG SUBL, Place 1 tablet under the tongue daily., Disp: , Rfl:  .  cyclobenzaprine (FLEXERIL) 10 MG tablet, Take 10 mg by mouth 3 (three) times daily., Disp: , Rfl: 1 .  estradiol (VIVELLE-DOT) 0.05 MG/24HR patch, APPLY 1 PATCH TWICE A WEEK, Disp: , Rfl: 11 .  tacrolimus (PROTOPIC) 0.1 % ointment, Apply 1 application topically daily., Disp: 300 g, Rfl: 1 .  atenolol (TENORMIN) 25 MG tablet, Take 1 tablet (25 mg total) by mouth daily., Disp: 30 tablet, Rfl: 0 .  citalopram (CELEXA) 20 MG tablet, Take 1 tablet (20 mg total) by mouth daily., Disp: 30 tablet, Rfl: 0 .  telmisartan-hydrochlorothiazide (MICARDIS HCT) 40-12.5 MG tablet, Take 1 tablet by mouth daily., Disp: 30 tablet, Rfl: 0  Allergies  Allergen Reactions  . Latex Anaphylaxis  . Belviq [Lorcaserin Hcl]     Dry mouth, palpitation  . Vicodin [Hydrocodone-Acetaminophen] Hives     ROS  Constitutional: Negative for fever or significant weight change.  Respiratory: Negative for cough and shortness of breath.   Cardiovascular: Negative for chest pain or palpitations.  Gastrointestinal: Negative for abdominal pain, no bowel changes.  Musculoskeletal: Negative for gait problem or joint swelling.  Skin: Negative for rash.  Neurological: Negative for dizziness or headache.  No other specific complaints in a complete  review of systems (except as listed in HPI above).  Objective  Vitals:   08/20/18 0837 08/20/18 0909  BP: (!) 156/98 (!) 152/98  Pulse: 84   Resp: 16   Temp: 98 F (36.7 C)   TempSrc: Oral   SpO2: 99%   Weight: 191 lb 4.8 oz (86.8 kg)   Height: 5\' 2"  (1.575 m)     Body mass index is 34.99 kg/m.  Physical Exam  Constitutional: Patient appears well-developed and well-nourished. Obese No distress.  HEENT: head atraumatic, normocephalic, pupils equal and reactive to lightneck supple, throat within normal limits Cardiovascular: Normal rate, regular rhythm and normal heart sounds.  No murmur heard. No BLE edema. Pulmonary/Chest: Effort normal and breath sounds normal. No respiratory distress. Abdominal: Soft.  There is no tenderness. Psychiatric: Patient has a normal mood and affect. behavior is normal. Judgment and thought content normal. Neurological: no focal deficit, normal balance   PHQ2/9: Depression screen [facility] 2/9 08/20/2018 04/17/2018 04/17/2018 01/10/2018 12/11/2017  Decreased Interest 0 - 0 0 0  Down, Depressed, Hopeless 0 - 0 3 0  PHQ - 2 Score 0 - 0 3 0  Altered sleeping 0 - 3 2 -  Tired, decreased energy 1 - 0 0 -  Change in appetite 1 - 0 0 -  Feeling bad or failure about yourself  0 - 0 3 -  Trouble concentrating 3 - 0 3 -  Moving slowly or fidgety/restless 0 - 0 1 -  Suicidal thoughts 0 - 0 0 -  PHQ-9 Score 5 - 3 12 -  Difficult doing work/chores Somewhat difficult Not difficult at all Not difficult at all Somewhat difficult -     GAD 7 : Generalized Anxiety Score 04/17/2018  Nervous, Anxious, on Edge 1  Control/stop worrying 0  Worry too much - different things 1  Trouble relaxing 1  Restless 0  Easily annoyed or irritable 1  Afraid - awful might happen 0  Total GAD 7 Score 4  Anxiety Difficulty Somewhat difficult    Fall Risk: Fall Risk  08/20/2018 12/11/2017 10/02/2017 03/29/2017 11/14/2016  Falls in the past year? Yes No No No No  Number falls in past  yr: 2 or more - - - -  Injury with Fall? Yes - - - -  Comment Broke a bone in her right foot with one of the falls - - - -     Functional Status Survey: Is the patient deaf or have difficulty hearing?: No Does the patient have difficulty seeing, even when wearing glasses/contacts?: Yes(glasses) Does the patient have difficulty concentrating, remembering, or making decisions?: Yes(Brain Fog-unable to remember stuff) Does the patient have difficulty walking or climbing stairs?: No Does the patient have difficulty dressing or bathing?: No Does the patient have difficulty doing errands alone such as visiting a doctor's office or shopping?: No    Assessment & Plan  1. Essential hypertension  - telmisartan-hydrochlorothiazide (MICARDIS HCT) 40-12.5 MG tablet; Take 1 tablet by mouth daily.  Dispense: 30 tablet; Refill: 0 - atenolol (TENORMIN) 25 MG tablet; Take 1 tablet (25 mg total) by mouth daily.  Dispense: 30 tablet; Refill: 0 - CBC with Differential/Platelet - TSH - Comprehensive metabolic panel  2. Need for immunization against influenza  - Flu Vaccine QUAD 6+ mos PF IM (Fluarix Quad PF)  3. Obesity (BMI 30-39.9)  Discussed with the patient the risk posed by an increased BMI. Discussed importance of portion control, calorie counting and at least 150 minutes of physical activity weekly. Avoid sweet beverages and drink more water. Eat at least 6 servings of fruit and vegetables daily  She asked for Qsymia but stated cannot start it at this time because bp is elevated   4. Mild persistent asthma without complication  - citalopram (CELEXA) 20 MG tablet; Take 1 tablet (20 mg total) by mouth daily.  Dispense: 30 tablet; Refill: 0  5. Migraine with aura and without status migrainosus, not intractable  Doing well at this time  6. Prediabetes  - Hemoglobin A1c  7. B12 deficiency  - Vitamin B12

## 2018-08-21 ENCOUNTER — Other Ambulatory Visit: Payer: Self-pay | Admitting: Family Medicine

## 2018-08-21 DIAGNOSIS — I10 Essential (primary) hypertension: Secondary | ICD-10-CM

## 2018-08-23 LAB — COMPREHENSIVE METABOLIC PANEL
ALBUMIN: 4.3 g/dL (ref 3.5–5.5)
ALK PHOS: 110 IU/L (ref 39–117)
ALT: 18 IU/L (ref 0–32)
AST: 17 IU/L (ref 0–40)
Albumin/Globulin Ratio: 1.5 (ref 1.2–2.2)
BILIRUBIN TOTAL: 0.3 mg/dL (ref 0.0–1.2)
BUN / CREAT RATIO: 16 (ref 9–23)
BUN: 13 mg/dL (ref 6–24)
CHLORIDE: 105 mmol/L (ref 96–106)
CO2: 24 mmol/L (ref 20–29)
Calcium: 9.7 mg/dL (ref 8.7–10.2)
Creatinine, Ser: 0.83 mg/dL (ref 0.57–1.00)
GFR calc Af Amer: 92 mL/min/{1.73_m2} (ref >59)
GFR calc non Af Amer: 80 mL/min/{1.73_m2} (ref >59)
Globulin, Total: 2.9 g/dL (ref 1.5–4.5)
Glucose: 82 mg/dL (ref 65–99)
Potassium: 4.4 mmol/L (ref 3.5–5.2)
SODIUM: 142 mmol/L (ref 134–144)
Total Protein: 7.2 g/dL (ref 6.0–8.5)

## 2018-08-23 LAB — CBC WITH DIFFERENTIAL/PLATELET
BASOS ABS: 0 10*3/uL (ref 0.0–0.2)
Basos: 1 %
EOS (ABSOLUTE): 0.1 10*3/uL (ref 0.0–0.4)
EOS: 2 %
Hematocrit: 41.8 % (ref 34.0–46.6)
Hemoglobin: 13.8 g/dL (ref 11.1–15.9)
IMMATURE GRANULOCYTES: 0 %
Immature Grans (Abs): 0 10*3/uL (ref 0.0–0.1)
Lymphocytes Absolute: 2.1 10*3/uL (ref 0.7–3.1)
Lymphs: 28 %
MCH: 27.2 pg (ref 26.6–33.0)
MCHC: 33 g/dL (ref 31.5–35.7)
MCV: 82 fL (ref 79–97)
MONOS ABS: 0.7 10*3/uL (ref 0.1–0.9)
Monocytes: 9 %
NEUTROS PCT: 60 %
Neutrophils Absolute: 4.7 10*3/uL (ref 1.4–7.0)
PLATELETS: 384 10*3/uL (ref 150–450)
RBC: 5.07 x10E6/uL (ref 3.77–5.28)
RDW: 13 % (ref 12.3–15.4)
WBC: 7.7 10*3/uL (ref 3.4–10.8)

## 2018-08-23 LAB — VITAMIN B12: Vitamin B-12: 850 pg/mL (ref 232–1245)

## 2018-08-23 LAB — TSH: TSH: 1.23 u[IU]/mL (ref 0.450–4.500)

## 2018-08-23 LAB — HEMOGLOBIN A1C
ESTIMATED AVERAGE GLUCOSE: 111 mg/dL
HEMOGLOBIN A1C: 5.5 % (ref 4.8–5.6)

## 2018-09-13 ENCOUNTER — Other Ambulatory Visit: Payer: Self-pay | Admitting: Family Medicine

## 2018-09-13 DIAGNOSIS — R7303 Prediabetes: Secondary | ICD-10-CM

## 2018-09-14 ENCOUNTER — Other Ambulatory Visit: Payer: Self-pay | Admitting: Family Medicine

## 2018-09-14 DIAGNOSIS — J4530 Mild persistent asthma, uncomplicated: Secondary | ICD-10-CM

## 2018-09-14 DIAGNOSIS — I10 Essential (primary) hypertension: Secondary | ICD-10-CM

## 2018-10-16 ENCOUNTER — Other Ambulatory Visit: Payer: Self-pay | Admitting: Family Medicine

## 2018-10-16 DIAGNOSIS — J4530 Mild persistent asthma, uncomplicated: Secondary | ICD-10-CM

## 2018-10-16 DIAGNOSIS — I10 Essential (primary) hypertension: Secondary | ICD-10-CM

## 2018-10-17 ENCOUNTER — Other Ambulatory Visit: Payer: Self-pay | Admitting: Family Medicine

## 2018-10-17 DIAGNOSIS — I10 Essential (primary) hypertension: Secondary | ICD-10-CM

## 2018-10-19 ENCOUNTER — Encounter: Payer: Self-pay | Admitting: Family Medicine

## 2018-10-19 ENCOUNTER — Ambulatory Visit: Payer: Commercial Managed Care - PPO | Admitting: Family Medicine

## 2018-10-19 VITALS — BP 106/68 | HR 86 | Temp 97.6°F | Resp 16 | Ht 62.0 in | Wt 187.4 lb

## 2018-10-19 DIAGNOSIS — I10 Essential (primary) hypertension: Secondary | ICD-10-CM | POA: Diagnosis not present

## 2018-10-19 DIAGNOSIS — G43109 Migraine with aura, not intractable, without status migrainosus: Secondary | ICD-10-CM

## 2018-10-19 MED ORDER — ATENOLOL 25 MG PO TABS: 25.0000 mg | ORAL_TABLET | Freq: Every day | ORAL | 0 refills | Status: DC

## 2018-10-19 MED ORDER — LOSARTAN POTASSIUM-HCTZ 50-12.5 MG PO TABS: 1.0000 | ORAL_TABLET | Freq: Every day | ORAL | 0 refills | Status: DC

## 2018-10-19 NOTE — Progress Notes (Signed)
Name: Tammy Swanson   MRN: [id number]    DOB: [birth year]   Date:10/19/2018       Progress Note  Subjective  Chief Complaint  Chief Complaint  Patient presents with  . Hypertension    BP was elevated and Dr. Ancil Boozer changed dosage-BP is now low states at home she will get 98/70's. Her headaches are reducing but BP is staying low.  . Follow-up    HPI  HTN: she was seen last visit and bp was very high, we added micardis to HCTZ and added Atenolol because of headaches. BP at home is low but denies dizziness, chest pain or palpitation. She states headaches has improved and no headache today. She is taking both medications in am. We will change from micardis hctz to lower dose of losartan hctz, and advised to take atenolol at night, return in 2 weeks to see CMA and check bp and in 3 months for follow up  Patient Active Problem List   Diagnosis Date Noted  . History of carpal tunnel surgery of right wrist 12/06/2016  . Prediabetes 10/15/2016  . History of liver failure 08/24/2016  . Migraine without aura and without status migrainosus, not intractable 09/10/2015  . Eczema 09/10/2015  . Bad memory 06/01/2015  . Menopause 06/01/2015  . Cough variant asthma 06/01/2015  . Obesity (BMI 30-39.9) 06/01/2015  . Nodule, subcutaneous 06/01/2015  . Lesion of ulnar nerve 06/01/2015  . Vitiligo 06/01/2015  . Benign hypertension 09/03/2010    Past Surgical History:  Procedure Laterality Date  . ABDOMINAL EXPLORATION SURGERY     x3  . ABDOMINAL HYSTERECTOMY  2013  . ABDOMINAL HYSTERECTOMY  2014  . CARPAL TUNNEL RELEASE Right 12/06/2016   Procedure: CARPAL TUNNEL RELEASE;  Surgeon: Hessie Knows, MD;  Location: [facility] ORS;  Service: Orthopedics;  Laterality: Right;  . CHOLECYSTECTOMY    . FOOT SURGERY    . FOOT SURGERY Left 2007/2009   Dr. Elvina Mattes  . KNEE ARTHROSCOPY Left 1985  . KNEE SURGERY Left   . MANDIBLE FRACTURE SURGERY    . NASAL SINUS SURGERY    . NASAL SINUS SURGERY  2012  .  RECONSTRUCTION MANDIBLE / MAXILLA  1992  . TONSILLECTOMY    . TONSILLECTOMY  1981  . TRIGGER FINGER RELEASE      Family History  Problem Relation Age of Onset  . Hypertension Mother   . Cancer Mother        Breast   . HIV/AIDS Mother   . Breast cancer Mother   . Cancer Father   . Hypertension Sister   . Cancer Maternal Aunt        3 sisters with Breast Cancer  . Breast cancer Maternal Aunt   . Cancer Paternal Aunt   . Cancer Paternal Uncle   . Hypertension Maternal Grandmother   . Hypertension Maternal Grandfather   . Breast cancer Maternal Aunt     Social History   Socioeconomic History  . Marital status: Married    Spouse name: Gaspar Bidding   . Number of children: 2  . Years of education: Not on file  . Highest education level: Bachelor's degree (e.g., BA, AB, BS)  Occupational History  . Occupation: Investment banker, corporate: [employer]  . Financial resource strain: Somewhat hard  . Food insecurity:    Worry: Never true    Inability: Never true  . Transportation needs:    Medical: No    Non-medical: No  Tobacco Use  . Smoking status: Never Smoker  . Smokeless tobacco: Never Used  Substance and Sexual Activity  . Alcohol use: Yes    Comment: occasionally  . Drug use: No  . Sexual activity: Yes    Partners: Male    Comment: Hysterectomy  Lifestyle  . Physical activity:    Days per week: Not on file    Minutes per session: Not on file  . Stress: Not on file  Relationships  . Social connections:    Talks on phone: Not on file    Gets together: Not on file    Attends religious service: Not on file    Active member of club or organization: Not on file    Attends meetings of clubs or organizations: Not on file    Relationship status: Not on file  . Intimate partner violence:    Fear of current or ex partner: No    Emotionally abused: No    Physically abused: No    Forced sexual activity: No  Other Topics Concern  . 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  Adapalene-Benzoyl Peroxide (EPIDUO FORTE) 0.3-2.5 % GEL, Epiduo Forte 0.3 %-2.5 % topical gel with pump, Disp: , Rfl:  .  atenolol (TENORMIN) 25 MG tablet, Take 1 tablet (25 mg total) by mouth daily. At night, Disp: 90 tablet, Rfl: 0 .  budesonide-formoterol (SYMBICORT) 80-4.5 MCG/ACT inhaler, Inhale 2 puffs into the lungs 2 (two) times daily., Disp: 1 Inhaler, Rfl: 5 .  cholecalciferol (VITAMIN D) 1000 units tablet, Take 1,000 Units by mouth daily., Disp: , Rfl:  .  citalopram (CELEXA) 20 MG tablet, TAKE 1 TABLET BY MOUTH EVERY DAY, Disp: 30 tablet, Rfl: 0 .  Cyanocobalamin (B-12) 1000 MCG SUBL, Place 1 tablet under the tongue daily., Disp: , Rfl:  .  cyclobenzaprine (FLEXERIL) 10 MG tablet, Take 10 mg by mouth 3 (three) times daily., Disp: , Rfl: 1 .  estradiol (VIVELLE-DOT) 0.05 MG/24HR patch, APPLY 1 PATCH TWICE A WEEK, Disp: , Rfl: 11 .  OZEMPIC, 1 MG/DOSE, 2 MG/1.5ML SOPN, INJECT 1 MG INTO THE SKIN ONCE A WEEK., Disp: 9 mL, Rfl: 0 .  tacrolimus (PROTOPIC) 0.1 % ointment, Apply 1 application topically daily., Disp: 300 g, Rfl: 1 .  albuterol (VENTOLIN HFA) 108 (90 Base) MCG/ACT inhaler, Inhale 2 puffs into the lungs every 6 (six) hours as needed for wheezing or shortness of breath., Disp: 1 Inhaler, Rfl: 2 .  losartan-hydrochlorothiazide (HYZAAR) 50-12.5 MG tablet, Take 1 tablet by mouth daily., Disp: 90 tablet, Rfl: 0  Allergies  Allergen Reactions  . Latex Anaphylaxis  . Belviq [Lorcaserin Hcl]     Dry mouth, palpitation  . Vicodin [Hydrocodone-Acetaminophen] Hives    I personally reviewed active problem list, medication list, allergies, family history, social history with the patient/caregiver today.   ROS  Constitutional: Negative for fever or weight change.  Respiratory: Negative for cough and shortness of breath.   Cardiovascular: Negative for chest pain or palpitations.   Gastrointestinal: Negative for abdominal pain, no bowel changes.  Musculoskeletal: Negative for gait problem or joint swelling.  Skin: Negative for rash.  Neurological: Negative for dizziness or headache.  No other specific complaints in a complete review of systems (except as listed in HPI above).  Objective  Vitals:   10/19/18 1537  BP: 106/68  Pulse: 86  Resp: 16  Temp: 97.6  F (36.4 C)  TempSrc: Oral  SpO2: 96%  Weight: 187 lb 6.4 oz (85 kg)  Height: 5\' 2"  (1.575 m)    Body mass index is 34.28 kg/m.  Physical Exam  Constitutional: Patient appears well-developed and well-nourished. Obese  No distress.  HEENT: head atraumatic, normocephalic, pupils equal and reactive to light, neck supple, throat within normal limits Cardiovascular: Normal rate, regular rhythm and normal heart sounds.  No murmur heard. No BLE edema. Pulmonary/Chest: Effort normal and breath sounds normal. No respiratory distress. Abdominal: Soft.  There is no tenderness. Psychiatric: Patient has a normal mood and affect. behavior is normal. Judgment and thought content normal.  Recent Results (from the past 2160 hour(s))  CBC with Differential/Platelet     Status: None   Collection Time: 08/22/18  1:46 PM  Result Value Ref Range   WBC 7.7 3.4 - 10.8 x10E3/uL   RBC 5.07 3.77 - 5.28 x10E6/uL   Hemoglobin 13.8 11.1 - 15.9 g/dL   Hematocrit 41.8 34.0 - 46.6 %   MCV 82 79 - 97 fL   MCH 27.2 26.6 - 33.0 pg   MCHC 33.0 31.5 - 35.7 g/dL   RDW 13.0 12.3 - 15.4 %   Platelets 384 150 - 450 x10E3/uL   Neutrophils 60 Not Estab. %   Lymphs 28 Not Estab. %   Monocytes 9 Not Estab. %   Eos 2 Not Estab. %   Basos 1 Not Estab. %   Neutrophils Absolute 4.7 1.4 - 7.0 x10E3/uL   Lymphocytes Absolute 2.1 0.7 - 3.1 x10E3/uL   Monocytes Absolute 0.7 0.1 - 0.9 x10E3/uL   EOS (ABSOLUTE) 0.1 0.0 - 0.4 x10E3/uL   Basophils Absolute 0.0 0.0 - 0.2 x10E3/uL   Immature Granulocytes 0 Not Estab. %   Immature Grans (Abs)  0.0 0.0 - 0.1 x10E3/uL  Hemoglobin A1c     Status: None   Collection Time: 08/22/18  1:46 PM  Result Value Ref Range   Hgb A1c MFr Bld 5.5 4.8 - 5.6 %    Comment:          Prediabetes: 5.7 - 6.4          Diabetes: >6.4          Glycemic control for adults with diabetes: <7.0    Est. average glucose Bld gHb Est-mCnc 111 mg/dL  TSH     Status: None   Collection Time: 08/22/18  1:46 PM  Result Value Ref Range   TSH 1.230 0.450 - 4.500 uIU/mL  Vitamin B12     Status: None   Collection Time: 08/22/18  1:46 PM  Result Value Ref Range   Vitamin B-12 850 232 - 1,245 pg/mL  Comprehensive metabolic panel     Status: None   Collection Time: 08/22/18  1:46 PM  Result Value Ref Range   Glucose 82 65 - 99 mg/dL   BUN 13 6 - 24 mg/dL   Creatinine, Ser 0.83 0.57 - 1.00 mg/dL   GFR calc non Af Amer 80 >59 mL/min/1.73   GFR calc Af Amer 92 >59 mL/min/1.73   BUN/Creatinine Ratio 16 9 - 23   Sodium 142 134 - 144 mmol/L   Potassium 4.4 3.5 - 5.2 mmol/L   Chloride 105 96 - 106 mmol/L   CO2 24 20 - 29 mmol/L   Calcium 9.7 8.7 - 10.2 mg/dL   Total Protein 7.2 6.0 - 8.5 g/dL   Albumin 4.3 3.5 - 5.5 g/dL   Globulin, Total 2.9 1.5 -  4.5 g/dL   Albumin/Globulin Ratio 1.5 1.2 - 2.2   Bilirubin Total 0.3 0.0 - 1.2 mg/dL   Alkaline Phosphatase 110 39 - 117 IU/L   AST 17 0 - 40 IU/L   ALT 18 0 - 32 IU/L      PHQ2/9: Depression screen [facility] 2/9 10/19/2018 08/20/2018 04/17/2018 04/17/2018 01/10/2018  Decreased Interest 0 0 - 0 0  Down, Depressed, Hopeless 0 0 - 0 3  PHQ - 2 Score 0 0 - 0 3  Altered sleeping 0 0 - 3 2  Tired, decreased energy 1 1 - 0 0  Change in appetite 0 1 - 0 0  Feeling bad or failure about yourself  0 0 - 0 3  Trouble concentrating 1 3 - 0 3  Moving slowly or fidgety/restless 0 0 - 0 1  Suicidal thoughts 0 0 - 0 0  PHQ-9 Score 2 5 - 3 12  Difficult doing work/chores Somewhat difficult Somewhat difficult Not difficult at all Not difficult at all Somewhat difficult     Fall  Risk: Fall Risk  08/20/2018 12/11/2017 10/02/2017 03/29/2017 11/14/2016  Falls in the past year? Yes No No No No  Number falls in past yr: 2 or more - - - -  Injury with Fall? Yes - - - -  Comment Broke a bone in her right foot with one of the falls - - - -     Assessment & Plan  1. Essential hypertension  Stop micardis hctz, change to losartan hctz and continue atenolol since it is helping with anxiety and return for bp check only with cma in a couple of weeks, with me in 3 ,months  - losartan-hydrochlorothiazide (HYZAAR) 50-12.5 MG tablet; Take 1 tablet by mouth daily.  Dispense: 90 tablet; Refill: 0 - atenolol (TENORMIN) 25 MG tablet; Take 1 tablet (25 mg total) by mouth daily. At night  Dispense: 90 tablet; Refill: 0

## 2018-10-24 NOTE — Telephone Encounter (Signed)
ERROR

## 2018-11-02 ENCOUNTER — Ambulatory Visit: Payer: Self-pay

## 2018-11-07 ENCOUNTER — Ambulatory Visit: Payer: Self-pay

## 2018-11-07 ENCOUNTER — Ambulatory Visit: Payer: Commercial Managed Care - PPO

## 2018-11-07 DIAGNOSIS — I10 Essential (primary) hypertension: Secondary | ICD-10-CM

## 2018-11-07 NOTE — Progress Notes (Signed)
Pt is here for a BP check. Started Losartan/HCTZ on 10/19/18, denies any symptoms. BP today is 122/80 and HR is 68. Dr. Ancil Boozer notified, pt is to continue with current regimen and call if anything changes.

## 2018-11-18 ENCOUNTER — Other Ambulatory Visit: Payer: Self-pay | Admitting: Family Medicine

## 2018-11-18 DIAGNOSIS — J4530 Mild persistent asthma, uncomplicated: Secondary | ICD-10-CM

## 2019-01-18 ENCOUNTER — Ambulatory Visit: Payer: Commercial Managed Care - PPO | Admitting: Family Medicine

## 2019-01-18 ENCOUNTER — Encounter: Payer: Self-pay | Admitting: Family Medicine

## 2019-01-18 ENCOUNTER — Telehealth: Payer: Self-pay | Admitting: Family Medicine

## 2019-01-18 VITALS — BP 122/86 | HR 101 | Temp 97.7°F | Resp 18 | Ht 62.0 in | Wt 191.2 lb

## 2019-01-18 DIAGNOSIS — E538 Deficiency of other specified B group vitamins: Secondary | ICD-10-CM

## 2019-01-18 DIAGNOSIS — G43109 Migraine with aura, not intractable, without status migrainosus: Secondary | ICD-10-CM

## 2019-01-18 DIAGNOSIS — F320 Major depressive disorder, single episode, mild: Secondary | ICD-10-CM

## 2019-01-18 DIAGNOSIS — R7303 Prediabetes: Secondary | ICD-10-CM

## 2019-01-18 DIAGNOSIS — E669 Obesity, unspecified: Secondary | ICD-10-CM

## 2019-01-18 DIAGNOSIS — I10 Essential (primary) hypertension: Secondary | ICD-10-CM | POA: Diagnosis not present

## 2019-01-18 DIAGNOSIS — F419 Anxiety disorder, unspecified: Secondary | ICD-10-CM

## 2019-01-18 DIAGNOSIS — J45991 Cough variant asthma: Secondary | ICD-10-CM

## 2019-01-18 MED ORDER — PHENTERMINE-TOPIRAMATE ER 3.75-23 MG PO CP24: 1.0000 | ORAL_CAPSULE | Freq: Every day | ORAL | 0 refills | Status: DC

## 2019-01-18 MED ORDER — LOSARTAN POTASSIUM-HCTZ 50-12.5 MG PO TABS: 1.0000 | ORAL_TABLET | Freq: Every day | ORAL | 0 refills | Status: DC

## 2019-01-18 MED ORDER — ATENOLOL 25 MG PO TABS: 25.0000 mg | ORAL_TABLET | Freq: Every day | ORAL | 0 refills | Status: DC

## 2019-01-18 MED ORDER — SEMAGLUTIDE (1 MG/DOSE) 2 MG/1.5ML ~~LOC~~ SOPN
1.0000 mg | PEN_INJECTOR | SUBCUTANEOUS | 0 refills | Status: DC
Start: 2019-01-18 — End: 2019-08-23

## 2019-01-18 MED ORDER — SERTRALINE HCL 50 MG PO TABS: 50.0000 mg | ORAL_TABLET | Freq: Every day | ORAL | 0 refills | Status: DC

## 2019-01-18 NOTE — Telephone Encounter (Signed)
Copied from [place] [id number]. Topic: Quick Communication - See Telephone Encounter >> Jan 18, 2019  5:13 PM Blase Mess A wrote: CRM for notification. See Telephone encounter for: 01/18/19.  Vicente Males from [facility] is calling regarding drug interaction with sertraline (ZOLOFT) 50 MG tablet [[id number]] & QSYMIA 11.25-69 MG CP24 [[id number]]  preferred is belviq Please advise [phone]

## 2019-01-18 NOTE — Progress Notes (Signed)
Name: Tammy Swanson   MRN: [id number]    DOB: 04/08/1966   Date:01/18/2019       Progress Note  Subjective  Chief Complaint  Chief Complaint  Patient presents with  . Medication Refill  . Hypertension    Denies any symptoms  . Asthma  . Migraine  . Depression    HPI  HTN: She was takingBenicar HCTZ 40/12.5, but bp was elevated. She used to take Tribenzor but states it caused cramps and bp went too  low. We started Exforge back 01/2017. No cramps with Exforge HCTZ but has noticed a headache two hours after she takes medication, so she went   back on Benicar hctz and while skipping medication bp was 120/90 so we switched to   just HCTZ since Oct 2018 and bp was under control, however bp gradually went up again and was given Micardis hctz, bp dropped again, and switched to losartan hctz Fall of 2019 and added Atenolol for headaches. Today she did not take morning medication, and bp is at goal, we were going to eliminate hctz but since she wants to try qsymia we will continue current dose of losartan hctz. Her bp seems to spike when under stress or feeling overwhelmed. Hopefully the atenolol will help with anxiety also.   Asthma Cough Variant:doing well on Symbicort,wheezing at night has resolved, she has intermittent dry cough. No recent SOB at this time. Unchanged.   Migraine: she used to see neurologist , Dr. Brett Fairy, and she stopped taking Imitrex, she is doing okay, still has some neck pain intermittently but not as bad or frequently.She states she fell two weeks ago after a severe headache episode, nuchal area, witnessed by her adult son. She fell backwards and hit the corner of dresser. She did not recall any of the event, she states it may have lasted 2 minutes. It took a few minutes for her to feel back to normal. She did not shake, did not have bowel or bladder incontinence. EMS offered to take her to [facility] but she declined. Advised to go back to see neurologist. She states  also had a previous episode in June 2019, but does not recall a headache prior to that episode. She states not episodes of headache since started on Atenolol Fall 2019 , states no longer having dizzy spells either.   Obesity: she tried Belviq but did not work and caused side effects, Qysmia worked well in the past but not covered by insurance,we started her on Trulicity 05/2693 andhadlost 16lbsbut after 7 months she started to gain the weight back, so we changed to [place] 09/2017 at a weight of 199lbs and was  down to187.1 lbs. She is tired of Ozempic and states having polyphagia so stopped medication She asked for Qsymia and bp is controlled now, we will try PA, but needs to return in one month for bp check   Pre-diabetes: she denies polyphagia or polydipsia but has urinary frequency.She stopped Ozempic. Last hgbA1C down to 5.5%   Depression mild : her daughter is going to middle college 11 th grade at [employer]. She did not like Citalopram made her feel " weird" we will try zoloft, she states stress is down at home, her daughter got all A's    Patient Active Problem List   Diagnosis Date Noted  . History of carpal tunnel surgery of right wrist 12/06/2016  . Prediabetes 10/15/2016  . History of liver failure 08/24/2016  . Migraine without aura and without status  migrainosus, not intractable 09/10/2015  . Eczema 09/10/2015  . Bad memory 06/01/2015  . Menopause 06/01/2015  . Cough variant asthma 06/01/2015  . Obesity (BMI 30-39.9) 06/01/2015  . Nodule, subcutaneous 06/01/2015  . Lesion of ulnar nerve 06/01/2015  . Vitiligo 06/01/2015  . Benign hypertension 09/03/2010    Past Surgical History:  Procedure Laterality Date  . ABDOMINAL EXPLORATION SURGERY     x3  . ABDOMINAL HYSTERECTOMY  2013  . ABDOMINAL HYSTERECTOMY  2014  . CARPAL TUNNEL RELEASE Right 12/06/2016   Procedure: CARPAL TUNNEL RELEASE;  Surgeon: Hessie Knows, MD;  Location: [facility] ORS;  Service:  Orthopedics;  Laterality: Right;  . CHOLECYSTECTOMY    . FOOT SURGERY    . FOOT SURGERY Left 2007/2009   Dr. Elvina Mattes  . KNEE ARTHROSCOPY Left 1985  . KNEE SURGERY Left   . MANDIBLE FRACTURE SURGERY    . NASAL SINUS SURGERY    . NASAL SINUS SURGERY  2012  . RECONSTRUCTION MANDIBLE / MAXILLA  1992  . TONSILLECTOMY    . TONSILLECTOMY  1981  . TRIGGER FINGER RELEASE      Family History  Problem Relation Age of Onset  . Hypertension Mother   . Cancer Mother        Breast   . HIV/AIDS Mother   . Breast cancer Mother   . Cancer Father   . Hypertension Sister   . Cancer Maternal Aunt        3 sisters with Breast Cancer  . Breast cancer Maternal Aunt   . Cancer Paternal Aunt   . Cancer Paternal Uncle   . Hypertension Maternal Grandmother   . Hypertension Maternal Grandfather   . Breast cancer Maternal Aunt     Social History   Socioeconomic History  . Marital status: Married    Spouse name: Gaspar Bidding   . Number of children: 2  . Years of education: Not on file  . Highest education level: Bachelor's degree (e.g., BA, AB, BS)  Occupational History  . 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 Financial resource strain: Somewhat hard  . Food insecurity:    Worry: Never true    Inability: Never true  . Transportation needs:    Medical: No    Non-medical: No  Tobacco Use  . Smoking status: Never Smoker  . Smokeless tobacco: Never Used  Substance and Sexual Activity  . Alcohol use: Yes    Comment: occasionally  . Drug use: No  . Sexual activity: Yes    Partners: Male    Comment: Hysterectomy  Lifestyle  . Physical activity:    Days per week: Not on file    Minutes per session: Not on file  . Stress: Not on file  Relationships  . Social connections:    Talks on phone: Not on file    Gets together: Not on file    Attends religious service: Not on file    Active member of club or organization: Not on file    Attends meetings of clubs or  organizations: Not on file    Relationship status: Not on file  . Intimate partner violence:    Fear of current or ex partner: No    Emotionally abused: No    Physically abused: No    Forced sexual activity: No  Other Topics Concern  . 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  Adapalene-Benzoyl Peroxide (EPIDUO FORTE) 0.3-2.5 % GEL, Epiduo Forte 0.3 %-2.5 % topical gel with pump, Disp: , Rfl:  .  albuterol (VENTOLIN HFA) 108 (90 Base) MCG/ACT inhaler, Inhale 2 puffs into the lungs every 6 (six) hours as needed for wheezing or shortness of breath., Disp: 1 Inhaler, Rfl: 2 .  atenolol (TENORMIN) 25 MG tablet, Take 1 tablet (25 mg total) by mouth daily. At night, Disp: 90 tablet, Rfl: 0 .  budesonide-formoterol (SYMBICORT) 80-4.5 MCG/ACT inhaler, Inhale 2 puffs into the lungs 2 (two) times daily., Disp: 1 Inhaler, Rfl: 5 .  cholecalciferol (VITAMIN D) 1000 units tablet, Take 1,000 Units by mouth daily., Disp: , Rfl:  .  citalopram (CELEXA) 20 MG tablet, TAKE 1 TABLET BY MOUTH EVERY DAY, Disp: 90 tablet, Rfl: 1 .  Cyanocobalamin (B-12) 1000 MCG SUBL, Place 1 tablet under the tongue daily., Disp: , Rfl:  .  estradiol (VIVELLE-DOT) 0.05 MG/24HR patch, APPLY 1 PATCH TWICE A WEEK, Disp: , Rfl: 11 .  losartan-hydrochlorothiazide (HYZAAR) 50-12.5 MG tablet, Take 1 tablet by mouth daily., Disp: 90 tablet, Rfl: 0 .  OZEMPIC, 1 MG/DOSE, 2 MG/1.5ML SOPN, INJECT 1 MG INTO THE SKIN ONCE A WEEK., Disp: 9 mL, Rfl: 0 .  tacrolimus (PROTOPIC) 0.1 % ointment, Apply 1 application topically daily., Disp: 300 g, Rfl: 1 .  cyclobenzaprine (FLEXERIL) 10 MG tablet, Take 10 mg by mouth 3 (three) times daily., Disp: , Rfl: 1  Allergies  Allergen Reactions  . Latex Anaphylaxis  . Belviq [Lorcaserin Hcl]     Dry mouth, palpitation  . Vicodin [Hydrocodone-Acetaminophen] Hives    I personally reviewed active problem list, medication list,  allergies, family history, social history with the patient/caregiver today.   ROS  Constitutional: Negative for fever or weight change.  Respiratory: Negative for cough and shortness of breath.   Cardiovascular: Negative for chest pain or palpitations.  Gastrointestinal: Negative for abdominal pain, no bowel changes.  Musculoskeletal: Negative for gait problem or joint swelling.  Skin: Negative for rash.  Neurological: Negative for dizziness , positive for intermittent  headache.  No other specific complaints in a complete review of systems (except as listed in HPI above).  Objective  Vitals:   01/18/19 1615  BP: 122/86  Pulse: (!) 101  Resp: 18  Temp: 97.7 F (36.5 C)  TempSrc: Oral  SpO2: 96%  Weight: 191 lb 3.2 oz (86.7 kg)  Height: 5\' 2"  (1.575 m)    Body mass index is 34.97 kg/m.  Physical Exam  Constitutional: Patient appears well-developed and well-nourished. Overweight  No distress.  HEENT: head atraumatic, normocephalic, pupils equal and reactive to light, neck supple, throat within normal limits Cardiovascular: Normal rate, regular rhythm and normal heart sounds.  No murmur heard. No BLE edema. Pulmonary/Chest: Effort normal and breath sounds normal. No respiratory distress. Abdominal: Soft.  There is no tenderness. Psychiatric: Patient has a normal mood and affect. behavior is normal. Judgment and thought content normal.  PHQ2/9: Depression screen [facility] 2/9 01/18/2019 10/19/2018 08/20/2018 04/17/2018 04/17/2018  Decreased Interest 0 0 0 - 0  Down, Depressed, Hopeless 1 0 0 - 0  PHQ - 2 Score 1 0 0 - 0  Altered sleeping 1 0 0 - 3  Tired, decreased energy 1 1 1  - 0  Change in appetite 2 0 1 - 0  Feeling bad or failure about yourself  0 0 0 - 0  Trouble concentrating 2 1 3  - 0  Moving  slowly or fidgety/restless 2 0 0 - 0  Suicidal thoughts 0 0 0 - 0  PHQ-9 Score 9 2 5  - 3  Difficult doing work/chores Somewhat difficult Somewhat difficult Somewhat difficult Not  difficult at all Not difficult at all    GAD 7 : Generalized Anxiety Score 04/17/2018  Nervous, Anxious, on Edge 1  Control/stop worrying 0  Worry too much - different things 1  Trouble relaxing 1  Restless 0  Easily annoyed or irritable 1  Afraid - awful might happen 0  Total GAD 7 Score 4  Anxiety Difficulty Somewhat difficult    Fall Risk: Fall Risk  01/18/2019 08/20/2018 12/11/2017 10/02/2017 03/29/2017  Falls in the past year? 0 Yes No No No  Number falls in past yr: - 2 or more - - -  Injury with Fall? - Yes - - -  Comment - Broke a bone in her right foot with one of the falls - - -     Functional Status Survey: Is the patient deaf or have difficulty hearing?: No Does the patient have difficulty seeing, even when wearing glasses/contacts?: No Does the patient have difficulty concentrating, remembering, or making decisions?: No Does the patient have difficulty walking or climbing stairs?: No Does the patient have difficulty dressing or bathing?: No Does the patient have difficulty doing errands alone such as visiting a doctor's office or shopping?: No    Assessment & Plan  1. Essential hypertension  - atenolol (TENORMIN) 25 MG tablet; Take 1 tablet (25 mg total) by mouth daily. At night  Dispense: 90 tablet; Refill: 0 - losartan-hydrochlorothiazide (HYZAAR) 50-12.5 MG tablet; Take 1 tablet by mouth daily.  Dispense: 90 tablet; Refill: 0  2. Prediabetes  - Semaglutide, 1 MG/DOSE, (OZEMPIC, 1 MG/DOSE,) 2 MG/1.5ML SOPN; Inject 1 mg into the skin once a week.  Dispense: 9 mL; Refill: 0  3. Obesity (BMI 30-39.9)  She is still on Ozempic, but states appetite is going up again, she responded well to Qsymia and wants to go back on it  - Phentermine-Topiramate 3.75-23 MG CP24; Take 1 tablet by mouth daily.  Dispense: 30 capsule; Refill: 0  4. B12 deficiency  Discussed otc medication   5. Cough variant asthma   6. Mild major depression (HCC)  - sertraline (ZOLOFT) 50 MG  tablet; Take 1 tablet (50 mg total) by mouth daily.  Dispense: 90 tablet; Refill: 0  7. Migraine with aura and without status migrainosus, not intractable   8. Anxiety  - sertraline (ZOLOFT) 50 MG tablet; Take 1 tablet (50 mg total) by mouth daily.  Dispense: 90 tablet; Refill: 0

## 2019-01-20 ENCOUNTER — Other Ambulatory Visit: Payer: Self-pay | Admitting: Family Medicine

## 2019-01-20 DIAGNOSIS — J45991 Cough variant asthma: Secondary | ICD-10-CM

## 2019-01-21 NOTE — Telephone Encounter (Signed)
Okay to give , Belviq did not work for her

## 2019-01-21 NOTE — Telephone Encounter (Signed)
Performed a PA for Qysmia and resubmitted to insurance.

## 2019-02-21 ENCOUNTER — Ambulatory Visit: Payer: Self-pay | Admitting: *Deleted

## 2019-02-21 NOTE — Telephone Encounter (Signed)
Please check on patient

## 2019-02-21 NOTE — Telephone Encounter (Signed)
Pt called with complaints of dizziness that started the afternoon on 02/18/2019; she describes it as being light headed; her BP was 172/104 on 02/20/19 on left upper arm, she says that she normally runs 120's/80s; recommendations made per nurse triage protocol; originally she says that she will get her husband to take her after he gets off work this evening, but she has a friend that can drop her off and pick her up from the MDs office; pt encouraged to have her friend to take her to the ED instead of waiting; she verbalized understanding and will proceed to the ED; will route to office for notification; pt normally seen by Dr Allie Bossier, [facility]; will route to office for notification.   Reason for Disposition . SEVERE dizziness (e.g., unable to stand, requires support to walk, feels like passing out now)  Answer Assessment - Initial Assessment Questions 1. DESCRIPTION: "Describe your dizziness."     Light headed 2. LIGHTHEADED: "Do you feel lightheaded?" (e.g., somewhat faint, woozy, weak upon standing)   Woozy, weak upon standing 3. VERTIGO: "Do you feel like either you or the room is spinning or tilting?" (i.e. vertigo)     no 4. SEVERITY: "How bad is it?"  "Do you feel like you are going to faint?" "Can you stand and walk?"   - MILD - walking normally   - MODERATE - interferes with normal activities (e.g., work, school)    - SEVERE - unable to stand, requires support to walk, feels like passing out now.      Require support to walk 5. ONSET:  "When did the dizziness begin?"     02/18/2019 6. AGGRAVATING FACTORS: "Does anything make it worse?" (e.g., standing, change in head position)     standing 7. HEART RATE: "Can you tell me your heart rate?" "How many beats in 15 seconds?"  (Note: not all patients can do this)       Not able to complete this task 8. CAUSE: "What do you think is causing the dizziness?"     "I don't know" 9. RECURRENT SYMPTOM: "Have you had dizziness before?" If so,  ask: "When was the last time?" "What happened that time?"     Yes; last time symptoms resolved with rest 10. OTHER SYMPTOMS: "Do you have any other symptoms?" (e.g., fever, chest pain, vomiting, diarrhea, bleeding)       nausea 11. PREGNANCY: "Is there any chance you are pregnant?" "When was your last menstrual period?"       No hysterectomy  Protocols used: DIZZINESS [facility]

## 2019-02-25 ENCOUNTER — Encounter: Payer: Self-pay | Admitting: Family Medicine

## 2019-02-25 ENCOUNTER — Other Ambulatory Visit: Payer: Self-pay

## 2019-02-25 ENCOUNTER — Ambulatory Visit: Payer: Commercial Managed Care - PPO | Admitting: Family Medicine

## 2019-02-25 VITALS — BP 114/86 | HR 93 | Temp 97.5°F | Resp 16 | Ht 62.0 in | Wt 192.8 lb

## 2019-02-25 DIAGNOSIS — F320 Major depressive disorder, single episode, mild: Secondary | ICD-10-CM

## 2019-02-25 DIAGNOSIS — H9311 Tinnitus, right ear: Secondary | ICD-10-CM

## 2019-02-25 DIAGNOSIS — I10 Essential (primary) hypertension: Secondary | ICD-10-CM

## 2019-02-25 DIAGNOSIS — E669 Obesity, unspecified: Secondary | ICD-10-CM | POA: Diagnosis not present

## 2019-02-25 DIAGNOSIS — R42 Dizziness and giddiness: Secondary | ICD-10-CM

## 2019-02-25 NOTE — Patient Instructions (Signed)
Benign Positional Vertigo Vertigo is the feeling that you or your surroundings are moving when they are not. Benign positional vertigo is the most common form of vertigo. This is usually a harmless condition (benign). This condition is positional. This means that symptoms are triggered by certain movements and positions. This condition can be dangerous if it occurs while you are doing something that could cause harm to you or others. This includes activities such as driving or operating machinery. What are the causes? In many cases, the cause of this condition is not known. It may be caused by a disturbance in an area of the inner ear that helps your brain to sense movement and balance. This disturbance can be caused by: Viral infection (labyrinthitis). Head injury. Repetitive motion, such as jumping, dancing, or running. What increases the risk? You are more likely to develop this condition if: You are a woman. You are 75 years of age or older. What are the signs or symptoms? Symptoms of this condition usually happen when you move your head or your eyes in different directions. Symptoms may start suddenly, and usually last for less than a minute. They include: Loss of balance and falling. Feeling like you are spinning or moving. Feeling like your surroundings are spinning or moving. Nausea and vomiting. Blurred vision. Dizziness. Involuntary eye movement (nystagmus). Symptoms can be mild and cause only minor problems, or they can be severe and interfere with daily life. Episodes of benign positional vertigo may return (recur) over time. Symptoms may improve over time. How is this diagnosed? This condition may be diagnosed based on: Your medical history. Physical exam of the head, neck, and ears. Tests, such as: MRI. CT scan. Eye movement tests. Your health care provider may ask you to change positions quickly while he or she watches you for symptoms of benign positional vertigo, such as  nystagmus. Eye movement may be tested with a variety of exams that are designed to evaluate or stimulate vertigo. An electroencephalogram (EEG). This records electrical activity in your brain. Hearing tests. You may be referred to a health care provider who specializes in ear, nose, and throat (ENT) problems (otolaryngologist) or a provider who specializes in disorders of the nervous system (neurologist). How is this treated?  This condition may be treated in a session in which your health care provider moves your head in specific positions to adjust your inner ear back to normal. Treatment for this condition may take several sessions. Surgery may be needed in severe cases, but this is rare. In some cases, benign positional vertigo may resolve on its own in 2-4 weeks. Follow these instructions at home: Safety Move slowly. Avoid sudden body or head movements or certain positions, as told by your health care provider. Avoid driving until your health care provider says it is safe for you to do so. Avoid operating heavy machinery until your health care provider says it is safe for you to do so. Avoid doing any tasks that would be dangerous to you or others if vertigo occurs. If you have trouble walking or keeping your balance, try using a cane for stability. If you feel dizzy or unstable, sit down right away. Return to your normal activities as told by your health care provider. Ask your health care provider what activities are safe for you. General instructions Take over-the-counter and prescription medicines only as told by your health care provider. Drink enough fluid to keep your urine pale yellow. Keep all follow-up visits as told by  your health care provider. This is important. Contact a health care provider if: You have a fever. Your condition gets worse or you develop new symptoms. Your family or friends notice any behavioral changes. You have nausea or vomiting that gets worse. You  have numbness or a "pins and needles" sensation. Get help right away if you: Have difficulty speaking or moving. Are always dizzy. Faint. Develop severe headaches. Have weakness in your legs or arms. Have changes in your hearing or vision. Develop a stiff neck. Develop sensitivity to light. Summary Vertigo is the feeling that you or your surroundings are moving when they are not. Benign positional vertigo is the most common form of vertigo. The cause of this condition is not known. It may be caused by a disturbance in an area of the inner ear that helps your brain to sense movement and balance. Symptoms include loss of balance and falling, feeling that you or your surroundings are moving, nausea and vomiting, and blurred vision. This condition can be diagnosed based on symptoms, physical exam, and other tests, such as MRI, CT scan, eye movement tests, and hearing tests. Follow safety instructions as told by your health care provider. You will also be told when to contact your health care provider in case of problems. This information is not intended to replace advice given to you by your health care provider. Make sure you discuss any questions you have with your health care provider. Document Released: 09/05/2006 Document Revised: 05/09/2018 Document Reviewed: 05/09/2018 Elsevier Interactive Patient Education  2019 [employer]. Vertigo Vertigo is the feeling that you or your surroundings are moving when they are not. Vertigo can be dangerous if it occurs while you are doing something that could endanger you or others, such as driving. What are the causes? This condition is caused by a disturbance in the signals that are sent by your body's sensory systems to your brain. Different causes of a disturbance can lead to vertigo, including:  Infections, especially in the inner ear.  A bad reaction to a drug, or misuse of alcohol and medicines.  Withdrawal from drugs or alcohol.  Quickly  changing positions, as when lying down or rolling over in bed.  Migraine headaches.  Decreased blood flow to the brain.  Decreased blood pressure.  Increased pressure in the brain from a head or neck injury, stroke, infection, tumor, or bleeding.  Central nervous system disorders. What are the signs or symptoms? Symptoms of this condition usually occur when you move your head or your eyes in different directions. Symptoms may start suddenly, and they usually last for less than a minute. Symptoms may include:  Loss of balance and falling.  Feeling like you are spinning or moving.  Feeling like your surroundings are spinning or moving.  Nausea and vomiting.  Blurred vision or double vision.  Difficulty hearing.  Slurred speech.  Dizziness.  Involuntary eye movement (nystagmus). Symptoms can be mild and cause only slight annoyance, or they can be severe and interfere with daily life. Episodes of vertigo may return (recur) over time, and they are often triggered by certain movements. Symptoms may improve over time. How is this diagnosed? This condition may be diagnosed based on medical history and the quality of your nystagmus. Your health care provider may test your eye movements by asking you to quickly change positions to trigger the nystagmus. This may be called the Dix-Hallpike test, head thrust test, or roll test. You may be referred to a health care provider  who specializes in ear, nose, and throat (ENT) problems (otolaryngologist) or a provider who specializes in disorders of the central nervous system (neurologist). You may have additional testing, including:  A physical exam.  Blood tests.  MRI.  A CT scan.  An electrocardiogram (ECG). This records electrical activity in your heart.  An electroencephalogram (EEG). This records electrical activity in your brain.  Hearing tests. How is this treated? Treatment for this condition depends on the cause and the  severity of the symptoms. Treatment options include:  Medicines to treat nausea or vertigo. These are usually used for severe cases. Some medicines that are used to treat other conditions may also reduce or eliminate vertigo symptoms. These include: ? Medicines that control allergies (antihistamines). ? Medicines that control seizures (anticonvulsants). ? Medicines that relieve depression (antidepressants). ? Medicines that relieve anxiety (sedatives).  Head movements to adjust your inner ear back to normal. If your vertigo is caused by an ear problem, your health care provider may recommend certain movements to correct the problem.  Surgery. This is rare. Follow these instructions at home: Safety  Move slowly.Avoid sudden body or head movements.  Avoid driving.  Avoid operating heavy machinery.  Avoid doing any tasks that would cause danger to you or others if you would have a vertigo episode during the task.  If you have trouble walking or keeping your balance, try using a cane for stability. If you feel dizzy or unstable, sit down right away.  Return to your normal activities as told by your health care provider. Ask your health care provider what activities are safe for you. General instructions  Take over-the-counter and prescription medicines only as told by your health care provider.  Avoid certain positions or movements as told by your health care provider.  Drink enough fluid to keep your urine clear or pale yellow.  Keep all follow-up visits as told by your health care provider. This is important. Contact a health care provider if:  Your medicines do not relieve your vertigo or they make it worse.  You have a fever.  Your condition gets worse or you develop new symptoms.  Your family or friends notice any behavioral changes.  Your nausea or vomiting gets worse.  You have numbness or a "pins and needles" sensation in part of your body. Get help right away if:   You have difficulty moving or speaking.  You are always dizzy.  You faint.  You develop severe headaches.  You have weakness in your hands, arms, or legs.  You have changes in your hearing or vision.  You develop a stiff neck.  You develop sensitivity to light. This information is not intended to replace advice given to you by your health care provider. Make sure you discuss any questions you have with your health care provider. Document Released: 09/07/2005 Document Revised: 05/11/2016 Document Reviewed: 03/23/2015 Elsevier Interactive Patient Education  2019 [employer].

## 2019-02-25 NOTE — Progress Notes (Signed)
Name: Tammy Swanson   MRN: [id number]    DOB: 1966-08-19   Date:02/25/2019       Progress Note  Subjective  Chief Complaint  Chief Complaint  Patient presents with  . Hypertension  . Depression  . Anxiety  . Weight Check    HPI  HTN: we adjusted her bp medication 6 weeks ago because she had skipped medication that morning and bp was at goal, we changed from Micardis Hctz to losartan hctz. BP is at goal today, however she states one week ago bp was spiking to 170's/110 for 3 days associated with vertigo symptoms, initially worse when laying in bed and head movement. It is difficulty to walk on a straight line. She has also noticed some nausea, no vomiting. No other neuro problems. Denies cold symptoms prior to episodes  Depression: she was doing on zoloft, no side effects, but states stopped medication when she felt dizzy because it was the only new medication. Advised to go back on medication today  Obesity: unable to afford Qsymia, not covered by insurance.   Vertigo: she states first symptoms of tinnitus without hearing loss two weeks ago, last week she states noticed vertigo when moving her head and when turning in bed. She states currently feels like everything is fibrillating. Denies URI symptoms. No other neuro problems. Never had this type of symptoms before. She has noticed associated nausea but no vomiting. She missed two days of work last week.   Patient Active Problem List   Diagnosis Date Noted  . History of carpal tunnel surgery of right wrist 12/06/2016  . Prediabetes 10/15/2016  . History of liver failure 08/24/2016  . Migraine without aura and without status migrainosus, not intractable 09/10/2015  . Eczema 09/10/2015  . Bad memory 06/01/2015  . Menopause 06/01/2015  . Cough variant asthma 06/01/2015  . Obesity (BMI 30-39.9) 06/01/2015  . Nodule, subcutaneous 06/01/2015  . Lesion of ulnar nerve 06/01/2015  . Vitiligo 06/01/2015  . Benign hypertension  09/03/2010    Past Surgical History:  Procedure Laterality Date  . ABDOMINAL EXPLORATION SURGERY     x3  . ABDOMINAL HYSTERECTOMY  2013  . ABDOMINAL HYSTERECTOMY  2014  . CARPAL TUNNEL RELEASE Right 12/06/2016   Procedure: CARPAL TUNNEL RELEASE;  Surgeon: Hessie Knows, MD;  Location: [facility] ORS;  Service: Orthopedics;  Laterality: Right;  . CHOLECYSTECTOMY    . FOOT SURGERY    . FOOT SURGERY Left 2007/2009   Dr. Elvina Mattes  . KNEE ARTHROSCOPY Left 1985  . KNEE SURGERY Left   . MANDIBLE FRACTURE SURGERY    . NASAL SINUS SURGERY    . NASAL SINUS SURGERY  2012  . RECONSTRUCTION MANDIBLE / MAXILLA  1992  . TONSILLECTOMY    . TONSILLECTOMY  1981  . TRIGGER FINGER RELEASE      Family History  Problem Relation Age of Onset  . Hypertension Mother   . Cancer Mother        Breast   . HIV/AIDS Mother   . Breast cancer Mother   . Cancer Father   . Hypertension Sister   . Cancer Maternal Aunt        3 sisters with Breast Cancer  . Breast cancer Maternal Aunt   . Cancer Paternal Aunt   . Cancer Paternal Uncle   . Hypertension Maternal Grandmother   . Hypertension Maternal Grandfather   . Breast cancer Maternal Aunt     Social History   Socioeconomic History  . Marital  status: Married    Spouse name: Gaspar Bidding   . Number of children: 2  . Years of education: Not on file  . Highest education level: Bachelor's degree (e.g., BA, AB, BS)  Occupational History  . Occupation: Investment banker, corporate: [employer]  . Financial resource strain: Somewhat hard  . Food insecurity:    Worry: Never true    Inability: Never true  . Transportation needs:    Medical: No    Non-medical: No  Tobacco Use  . Smoking status: Never Smoker  . Smokeless tobacco: Never Used  Substance and Sexual Activity  . Alcohol use: Yes    Comment: occasionally  . Drug use: No  . Sexual activity: Yes    Partners: Male    Comment: Hysterectomy  Lifestyle  . Physical activity:    Days per  week: Not on file    Minutes per session: Not on file  . Stress: Not on file  Relationships  . Social connections:    Talks on phone: Not on file    Gets together: Not on file    Attends religious service: Not on file    Active member of club or organization: Not on file    Attends meetings of clubs or organizations: Not on file    Relationship status: Not on file  . Intimate partner violence:    Fear of current or ex partner: No    Emotionally abused: No    Physically abused: No    Forced sexual activity: No  Other Topics Concern  . Not on file  Social History Narrative   She is a Chiropractor for [employer]     Current Outpatient Medications:  .  Adapalene-Benzoyl Peroxide (EPIDUO FORTE) 0.3-2.5 % GEL, Epiduo Forte 0.3 %-2.5 % topical gel with pump, Disp: , Rfl:  .  atenolol (TENORMIN) 25 MG tablet, Take 1 tablet (25 mg total) by mouth daily. At night, Disp: 90 tablet, Rfl: 0 .  cholecalciferol (VITAMIN D) 1000 units tablet, Take 1,000 Units by mouth daily., Disp: , Rfl:  .  Cyanocobalamin (B-12) 1000 MCG SUBL, Place 1 tablet under the tongue daily., Disp: , Rfl:  .  estradiol (VIVELLE-DOT) 0.05 MG/24HR patch, APPLY 1 PATCH TWICE A WEEK, Disp: , Rfl: 11 .  losartan-hydrochlorothiazide (HYZAAR) 50-12.5 MG tablet, Take 1 tablet by mouth daily., Disp: 90 tablet, Rfl: 0 .  Semaglutide, 1 MG/DOSE, (OZEMPIC, 1 MG/DOSE,) 2 MG/1.5ML SOPN, Inject 1 mg into the skin once a week., Disp: 9 mL, Rfl: 0 .  sertraline (ZOLOFT) 50 MG tablet, Take 1 tablet (50 mg total) by mouth daily., Disp: 90 tablet, Rfl: 0 .  SYMBICORT 80-4.5 MCG/ACT inhaler, TAKE 2 PUFFS BY MOUTH TWICE A DAY, Disp: 10.2 Inhaler, Rfl: 0 .  tacrolimus (PROTOPIC) 0.1 % ointment, Apply 1 application topically daily., Disp: 300 g, Rfl: 1 .  albuterol (VENTOLIN HFA) 108 (90 Base) MCG/ACT inhaler, Inhale 2 puffs into the lungs every 6 (six) hours as needed for wheezing or shortness of breath., Disp: 1 Inhaler, Rfl: 2  Allergies   Allergen Reactions  . Latex Anaphylaxis  . Belviq [Lorcaserin Hcl]     Dry mouth, palpitation  . Vicodin [Hydrocodone-Acetaminophen] Hives    I personally reviewed active problem list, medication list, allergies, family history, social history with the patient/caregiver today.   ROS  Constitutional: Negative for fever or weight change.  Respiratory: Negative for cough and shortness of breath.   Cardiovascular: Negative  for chest pain or palpitations.  Gastrointestinal: Negative for abdominal pain, no bowel changes.  Musculoskeletal: positive for gait problem -cannot walk in a straight line, but no  joint swelling.  Skin: Negative for rash.  Neurological: Positive  for dizziness and also nuchal  headache.  No other specific complaints in a complete review of systems (except as listed in HPI above).   Objective  Vitals:   02/25/19 1310  BP: 114/86  Pulse: 93  Resp: 16  Temp: (!) 97.5 F (36.4 C)  TempSrc: Oral  SpO2: 98%  Weight: 192 lb 12.8 oz (87.5 kg)  Height: 5\' 2"  (1.575 m)    Body mass index is 35.26 kg/m.  Physical Exam  Constitutional: Patient appears well-developed and well-nourished. Obese  No distress.  HEENT: head atraumatic, normocephalic, pupils equal and reactive to light, ears: normal TM bilaterally, no nystagmus  neck supple, throat within normal limits Cardiovascular: Normal rate, regular rhythm and normal heart sounds.  No murmur heard. No BLE edema. Pulmonary/Chest: Effort normal and breath sounds normal. No respiratory distress. Abdominal: Soft.  There is no tenderness. Neurological: normal cranial nerves, romberg negative, normal sensation , normal finger nose exam.  Psychiatric: Patient has a normal mood and affect. behavior is normal. Judgment and thought content normal.  PHQ2/9: Depression screen [facility] 2/9 02/25/2019 01/18/2019 10/19/2018 08/20/2018 04/17/2018  Decreased Interest 1 0 0 0 -  Down, Depressed, Hopeless 0 1 0 0 -  PHQ - 2 Score 1 1 0 0  -  Altered sleeping 1 1 0 0 -  Tired, decreased energy 1 1 1 1  -  Change in appetite 1 2 0 1 -  Feeling bad or failure about yourself  0 0 0 0 -  Trouble concentrating 2 2 1 3  -  Moving slowly or fidgety/restless 1 2 0 0 -  Suicidal thoughts 0 0 0 0 -  PHQ-9 Score 7 9 2 5  -  Difficult doing work/chores Not difficult at all Somewhat difficult Somewhat difficult Somewhat difficult Not difficult at all     Fall Risk: Fall Risk  02/25/2019 01/18/2019 08/20/2018 12/11/2017 10/02/2017  Falls in the past year? 0 0 Yes No No  Number falls in past yr: 0 - 2 or more - -  Injury with Fall? 0 - Yes - -  Comment - - Broke a bone in her right foot with one of the falls - -     Assessment & Plan  1. Essential hypertension  Continue medication, bp is at goal today   2. Mild major depression ([place])  Resume Zoloft today   3. Obesity (BMI 30-39.9)  Discussed with the patient the risk posed by an increased BMI. Discussed importance of portion control, calorie counting and at least 150 minutes of physical activity weekly. Avoid sweet beverages and drink more water. Eat at least 6 servings of fruit and vegetables daily   4. Right-sided tinnitus  - Ambulatory referral to ENT  5. Vertigo  - Ambulatory referral to ENT

## 2019-03-06 ENCOUNTER — Ambulatory Visit: Payer: Self-pay | Admitting: Family Medicine

## 2019-04-12 ENCOUNTER — Ambulatory Visit: Payer: Self-pay | Admitting: Family Medicine

## 2019-04-24 ENCOUNTER — Other Ambulatory Visit: Payer: Self-pay | Admitting: Family Medicine

## 2019-04-24 DIAGNOSIS — I10 Essential (primary) hypertension: Secondary | ICD-10-CM

## 2019-04-24 NOTE — Telephone Encounter (Signed)
Refill request for Hypertension medication:  Atenolol 25 mg  Last office visit pertaining to hypertension: 02/25/2019  BP Readings from Last 3 Encounters:  02/25/19 114/86  01/18/19 122/86  10/19/18 106/68     Lab Results  Component Value Date   CREATININE 0.83 08/22/2018   BUN 13 08/22/2018   NA 142 08/22/2018   K 4.4 08/22/2018   CL 105 08/22/2018   CO2 24 08/22/2018    Follow-ups on file. 07/30/2019

## 2019-05-18 ENCOUNTER — Other Ambulatory Visit: Payer: Self-pay | Admitting: Family Medicine

## 2019-05-18 DIAGNOSIS — I10 Essential (primary) hypertension: Secondary | ICD-10-CM

## 2019-05-20 NOTE — Telephone Encounter (Signed)
appt scheduled for 7.10.2020

## 2019-05-27 ENCOUNTER — Other Ambulatory Visit: Payer: Self-pay | Admitting: Family Medicine

## 2019-05-27 DIAGNOSIS — J4530 Mild persistent asthma, uncomplicated: Secondary | ICD-10-CM

## 2019-06-21 ENCOUNTER — Encounter: Payer: Self-pay | Admitting: Family Medicine

## 2019-06-21 ENCOUNTER — Other Ambulatory Visit: Payer: Self-pay

## 2019-06-21 ENCOUNTER — Ambulatory Visit: Payer: Commercial Managed Care - PPO | Admitting: Family Medicine

## 2019-06-21 VITALS — BP 120/90 | HR 104 | Temp 97.1°F | Resp 16 | Ht 62.0 in | Wt 194.7 lb

## 2019-06-21 DIAGNOSIS — R7303 Prediabetes: Secondary | ICD-10-CM | POA: Diagnosis not present

## 2019-06-21 DIAGNOSIS — G43009 Migraine without aura, not intractable, without status migrainosus: Secondary | ICD-10-CM | POA: Diagnosis not present

## 2019-06-21 DIAGNOSIS — F419 Anxiety disorder, unspecified: Secondary | ICD-10-CM

## 2019-06-21 DIAGNOSIS — F320 Major depressive disorder, single episode, mild: Secondary | ICD-10-CM

## 2019-06-21 DIAGNOSIS — I10 Essential (primary) hypertension: Secondary | ICD-10-CM | POA: Diagnosis not present

## 2019-06-21 DIAGNOSIS — M26621 Arthralgia of right temporomandibular joint: Secondary | ICD-10-CM

## 2019-06-21 MED ORDER — SERTRALINE HCL 50 MG PO TABS: 50.0000 mg | ORAL_TABLET | Freq: Every day | ORAL | 0 refills | Status: DC

## 2019-06-21 MED ORDER — TOPIRAMATE 50 MG PO TABS: 50.0000 mg | ORAL_TABLET | Freq: Every day | ORAL | 0 refills | Status: DC

## 2019-06-21 MED ORDER — SUMATRIPTAN SUCCINATE 100 MG PO TABS: 100.0000 mg | ORAL_TABLET | ORAL | 0 refills | Status: DC | PRN

## 2019-06-21 NOTE — Progress Notes (Signed)
Name: Tammy Swanson   MRN: [id number]    DOB: [birth year]   Date:06/21/2019       Progress Note  Subjective  Chief Complaint  Chief Complaint  Patient presents with  . Hypertension  . Migraine  . Prediabetes    HPI  HTN: DBP is elevated today, she states finished prednisone recently, no chest pain or palpitation. She has been compliant with medications.   Depression: she was doing on zoloft, stopped because she had vertigo, but states she is feeling trapped since COVID-19, phq 9 improved, but she feels more snappy and down at times. She is willing to resume medication   Vertigo: resolved, seen by Dr. Ladene Artist twice , was supposed to have audiological evaluation but it was cancelled because of COVID-19. She was treated for TMJ with prednisone by Dr. Ladene Artist.   Migraine headaches: she has a history of migraines, over the past two month pain has been recurrent and behind and below right eye, starts as a dull sensation in the mornings and gets more intense and throbbing, she has been taking Tylenol arthritis daily to help with symptoms and sometimes Aleve. No visual disturbance but she has photophobia and phonophobia with episodes, not associated with nausea or vomiting. She took topamax and nortriptyline in the past and wants to resume preventive medication  Patient Active Problem List   Diagnosis Date Noted  . History of carpal tunnel surgery of right wrist 12/06/2016  . Prediabetes 10/15/2016  . History of liver failure 08/24/2016  . Migraine without aura and without status migrainosus, not intractable 09/10/2015  . Eczema 09/10/2015  . Bad memory 06/01/2015  . Menopause 06/01/2015  . Cough variant asthma 06/01/2015  . Obesity (BMI 30-39.9) 06/01/2015  . Nodule, subcutaneous 06/01/2015  . Lesion of ulnar nerve 06/01/2015  . Vitiligo 06/01/2015  . Benign hypertension 09/03/2010    Past Surgical History:  Procedure Laterality Date  . ABDOMINAL EXPLORATION SURGERY     x3   . ABDOMINAL HYSTERECTOMY  2013  . ABDOMINAL HYSTERECTOMY  2014  . CARPAL TUNNEL RELEASE Right 12/06/2016   Procedure: CARPAL TUNNEL RELEASE;  Surgeon: Hessie Knows, MD;  Location: [facility] ORS;  Service: Orthopedics;  Laterality: Right;  . CHOLECYSTECTOMY    . FOOT SURGERY    . FOOT SURGERY Left 2007/2009   Dr. Elvina Mattes  . KNEE ARTHROSCOPY Left 1985  . KNEE SURGERY Left   . MANDIBLE FRACTURE SURGERY    . NASAL SINUS SURGERY    . NASAL SINUS SURGERY  2012  . RECONSTRUCTION MANDIBLE / MAXILLA  1992  . TONSILLECTOMY    . TONSILLECTOMY  1981  . TRIGGER FINGER RELEASE      Family History  Problem Relation Age of Onset  . Hypertension Mother   . Cancer Mother        Breast   . HIV/AIDS Mother   . Breast cancer Mother   . Cancer Father   . Hypertension Sister   . Cancer Maternal Aunt        3 sisters with Breast Cancer  . Breast cancer Maternal Aunt   . Cancer Paternal Aunt   . Cancer Paternal Uncle   . Hypertension Maternal Grandmother   . Hypertension Maternal Grandfather   . Breast cancer Maternal Aunt     Social History   Socioeconomic History  . Marital status: Married    Spouse name: Gaspar Bidding   . Number of children: 2  . Years of education: Not on file  . Highest  education level: Bachelor's degree (e.g., BA, AB, BS)  Occupational History  . Occupation: Investment banker, corporate: [employer]  . Financial resource strain: Somewhat hard  . Food insecurity    Worry: Never true    Inability: Never true  . Transportation needs    Medical: No    Non-medical: No  Tobacco Use  . Smoking status: Never Smoker  . Smokeless tobacco: Never Used  Substance and Sexual Activity  . Alcohol use: Yes    Comment: occasionally  . Drug use: No  . Sexual activity: Yes    Partners: Male    Comment: Hysterectomy  Lifestyle  . Physical activity    Days per week: Not on file    Minutes per session: Not on file  . Stress: Not on file  Relationships  . Social Product manager on phone: Not on file    Gets together: Not on file    Attends religious service: Not on file    Active member of club or organization: Not on file    Attends meetings of clubs or organizations: Not on file    Relationship status: Not on file  . Intimate partner violence    Fear of current or ex partner: No    Emotionally abused: No    Physically abused: No    Forced sexual activity: No  Other Topics Concern  . Not on file  Social History Narrative   She is a Chiropractor for [employer]     Current Outpatient Medications:  .  Adapalene-Benzoyl Peroxide (EPIDUO FORTE) 0.3-2.5 % GEL, Epiduo Forte 0.3 %-2.5 % topical gel with pump, Disp: , Rfl:  .  atenolol (TENORMIN) 25 MG tablet, TAKE 1 TABLET (25 MG TOTAL) BY MOUTH DAILY. AT NIGHT, Disp: 90 tablet, Rfl: 0 .  cholecalciferol (VITAMIN D) 1000 units tablet, Take 1,000 Units by mouth daily., Disp: , Rfl:  .  Cyanocobalamin (B-12) 1000 MCG SUBL, Place 1 tablet under the tongue daily., Disp: , Rfl:  .  estradiol (VIVELLE-DOT) 0.05 MG/24HR patch, APPLY 1 PATCH TWICE A WEEK, Disp: , Rfl: 11 .  losartan-hydrochlorothiazide (HYZAAR) 50-12.5 MG tablet, TAKE 1 TABLET BY MOUTH EVERY DAY, Disp: 90 tablet, Rfl: 0 .  Semaglutide, 1 MG/DOSE, (OZEMPIC, 1 MG/DOSE,) 2 MG/1.5ML SOPN, Inject 1 mg into the skin once a week., Disp: 9 mL, Rfl: 0 .  sertraline (ZOLOFT) 50 MG tablet, Take 1 tablet (50 mg total) by mouth daily., Disp: 90 tablet, Rfl: 0 .  SYMBICORT 80-4.5 MCG/ACT inhaler, TAKE 2 PUFFS BY MOUTH TWICE A DAY, Disp: 10.2 Inhaler, Rfl: 0 .  tacrolimus (PROTOPIC) 0.1 % ointment, Apply 1 application topically daily., Disp: 300 g, Rfl: 1 .  albuterol (VENTOLIN HFA) 108 (90 Base) MCG/ACT inhaler, Inhale 2 puffs into the lungs every 6 (six) hours as needed for wheezing or shortness of breath., Disp: 1 Inhaler, Rfl: 2 .  topiramate (TOPAMAX) 50 MG tablet, Take 1-2 tablets (50-100 mg total) by mouth at bedtime. Start with one at night for 3 days  after that 2 at night, Disp: 60 tablet, Rfl: 0  Allergies  Allergen Reactions  . Latex Anaphylaxis  . Belviq [Lorcaserin Hcl]     Dry mouth, palpitation  . Vicodin [Hydrocodone-Acetaminophen] Hives    I personally reviewed active problem list, medication list, allergies, family history, social history with the patient/caregiver today.   ROS  Constitutional: Negative for fever or weight change.  Respiratory: Negative  for cough and shortness of breath.   Cardiovascular: Negative for chest pain or palpitations.  Gastrointestinal: Negative for abdominal pain, no bowel changes.  Musculoskeletal: Negative for gait problem or joint swelling.  Skin: Negative for rash.  Neurological: Negative for dizziness , positive for  headache.  No other specific complaints in a complete review of systems (except as listed in HPI above).  Objective  Vitals:   06/21/19 1051  BP: 120/90  Pulse: (!) 104  Resp: 16  Temp: (!) 97.1 F (36.2 C)  TempSrc: Oral  SpO2: 98%  Weight: 194 lb 11.2 oz (88.3 kg)  Height: 5\' 2"  (1.575 m)    Body mass index is 35.61 kg/m.  Physical Exam  Constitutional: Patient appears well-developed and well-nourished. Obese  No distress.  HEENT: head atraumatic, normocephalic, pupils equal and reactive to light,  neck supple Cardiovascular: Normal rate, regular rhythm and normal heart sounds.  No murmur heard. No BLE edema. Pulmonary/Chest: Effort normal and breath sounds normal. No respiratory distress. Abdominal: Soft.  There is no tenderness. Psychiatric: Patient has a normal mood and affect. behavior is normal. Judgment and thought content normal.  PHQ2/9: Depression screen [facility] 2/9 06/21/2019 02/25/2019 01/18/2019 10/19/2018 08/20/2018  Decreased Interest 0 1 0 0 0  Down, Depressed, Hopeless 0 0 1 0 0  PHQ - 2 Score 0 1 1 0 0  Altered sleeping 0 1 1 0 0  Tired, decreased energy 0 1 1 1 1   Change in appetite 0 1 2 0 1  Feeling bad or failure about yourself  0 0 0 0  0  Trouble concentrating 0 2 2 1 3   Moving slowly or fidgety/restless 0 1 2 0 0  Suicidal thoughts 0 0 0 0 0  PHQ-9 Score 0 7 9 2 5   Difficult doing work/chores - Not difficult at all Somewhat difficult Somewhat difficult Somewhat difficult  Some recent data might be hidden    phq 9 is negative   Fall Risk: Fall Risk  06/21/2019 02/25/2019 01/18/2019 08/20/2018 12/11/2017  Falls in the past year? 0 0 0 Yes No  Number falls in past yr: 0 0 - 2 or more -  Injury with Fall? 0 0 - Yes -  Comment - - - Broke a bone in her right foot with one of the falls -    Functional Status Survey: Is the patient deaf or have difficulty hearing?: No Does the patient have difficulty seeing, even when wearing glasses/contacts?: No Does the patient have difficulty concentrating, remembering, or making decisions?: No Does the patient have difficulty walking or climbing stairs?: No Does the patient have difficulty dressing or bathing?: No Does the patient have difficulty doing errands alone such as visiting a doctor's office or shopping?: No    Assessment & Plan  1. Migraine without aura and without status migrainosus, not intractable  - topiramate (TOPAMAX) 50 MG tablet; Take 1-2 tablets (50-100 mg total) by mouth at bedtime. Start with one at night for 3 days after that 2 at night  Dispense: 60 tablet; Refill: 0 - SUMAtriptan (IMITREX) 100 MG tablet; Take 1 tablet (100 mg total) by mouth every 2 (two) hours as needed for migraine. May repeat in 2 hours if headache persists or recurs.  Dispense: 10 tablet; Refill: 0  2. Benign hypertension  Monitor, she is in pain, return in one month   3. Prediabetes  Stable, reviewed last labs   4. TMJ tenderness, right   5. Anxiety  - sertraline (  ZOLOFT) 50 MG tablet; Take 1 tablet (50 mg total) by mouth daily.  Dispense: 90 tablet; Refill: 0  6. Mild major depression (HCC)  - sertraline (ZOLOFT) 50 MG tablet; Take 1 tablet (50 mg total) by mouth daily.   Dispense: 90 tablet; Refill: 0

## 2019-07-15 ENCOUNTER — Other Ambulatory Visit: Payer: Self-pay | Admitting: Family Medicine

## 2019-07-15 DIAGNOSIS — G43009 Migraine without aura, not intractable, without status migrainosus: Secondary | ICD-10-CM

## 2019-07-23 ENCOUNTER — Other Ambulatory Visit: Payer: Self-pay | Admitting: Family Medicine

## 2019-07-23 DIAGNOSIS — I10 Essential (primary) hypertension: Secondary | ICD-10-CM

## 2019-07-30 ENCOUNTER — Other Ambulatory Visit: Payer: Self-pay | Admitting: Obstetrics & Gynecology

## 2019-07-30 DIAGNOSIS — Z1231 Encounter for screening mammogram for malignant neoplasm of breast: Secondary | ICD-10-CM

## 2019-07-30 LAB — HEMOGLOBIN A1C: Hemoglobin A1C: 5.9

## 2019-07-31 ENCOUNTER — Ambulatory Visit: Payer: Commercial Managed Care - PPO | Admitting: Family Medicine

## 2019-08-05 ENCOUNTER — Encounter: Payer: Self-pay | Admitting: Family Medicine

## 2019-08-12 ENCOUNTER — Other Ambulatory Visit: Payer: Self-pay | Admitting: Family Medicine

## 2019-08-12 DIAGNOSIS — I10 Essential (primary) hypertension: Secondary | ICD-10-CM

## 2019-08-15 ENCOUNTER — Ambulatory Visit: Payer: Commercial Managed Care - PPO | Admitting: Family Medicine

## 2019-08-16 ENCOUNTER — Other Ambulatory Visit: Payer: Self-pay | Admitting: Family Medicine

## 2019-08-16 DIAGNOSIS — G43009 Migraine without aura, not intractable, without status migrainosus: Secondary | ICD-10-CM

## 2019-08-16 NOTE — Telephone Encounter (Signed)
Requested medication (s) are due for refill today: yes  Requested medication (s) are on the active medication list: yes  Last refill: 07/15/2019  Future visit scheduled: yes  Notes to clinic:  This refill cannot be delegated       Requested Prescriptions  Pending Prescriptions Disp Refills   topiramate (TOPAMAX) 50 MG tablet [Pharmacy Med Name: TOPIRAMATE 50 MG TABLET] 60 tablet 0    Sig: START WITH 1 TABLET BY MOUTH AT NIGHT FOR 3 DAYS, THEN 2 TABLETS AT NIGHT THEREAFTER     Not Delegated - Neurology: Anticonvulsants - topiramate & zonisamide Failed - 08/16/2019  1:35 AM      Failed - This refill cannot be delegated      Passed - Cr in normal range and within 360 days    Creat  Date Value Ref Range Status  10/14/2016 0.88 0.50 - 1.05 mg/dL Final    Comment:      For patients > or = 55 years of age: The upper reference limit for Creatinine is approximately 13% higher for people identified as African-American.      Creatinine, Ser  Date Value Ref Range Status  08/22/2018 0.83 0.57 - 1.00 mg/dL Final         Passed - CO2 in normal range and within 360 days    CO2  Date Value Ref Range Status  08/22/2018 24 20 - 29 mmol/L Final         Passed - Valid encounter within last 12 months    Recent Outpatient Visits          1 month ago Migraine without aura and without status migrainosus, not intractable   [facility] Steele Sizer, MD   5 months ago Essential hypertension   [facility], Drue Stager, MD   7 months ago Obesity (BMI 30-39.9)   [facility] Steele Sizer, MD   10 months ago Essential hypertension   [facility] Steele Sizer, MD   12 months ago Essential hypertension   [facility] Steele Sizer, MD      Future Appointments            In 1 week Steele Sizer, MD [facility]            SUMAtriptan (IMITREX) 100 MG  tablet [Pharmacy Med Name: SUMATRIPTAN SUCC 100 MG TABLET] 10 tablet 0    Sig: TAKE 1 TABLET BY MOUTH EVERY 2 HOURS AS NEEDED FOR MIGRAINE. MAY REPEAT IN 2 HOURS IF HEADACHE PERSISTS OR RECURS.     Neurology:  Migraine Therapy - Triptan Failed - 08/16/2019  1:35 AM      Failed - Last BP in normal range    BP Readings from Last 1 Encounters:  06/21/19 120/90         Passed - Valid encounter within last 12 months    Recent Outpatient Visits          1 month ago Migraine without aura and without status migrainosus, not intractable   [facility] Steele Sizer, MD   5 months ago Essential hypertension   [facility], Drue Stager, MD   7 months ago Obesity (BMI 30-39.9)   [facility] Steele Sizer, MD   10 months ago Essential hypertension   [facility] Steele Sizer, MD   12 months ago Essential hypertension   [facility], [place],  MD      Future Appointments            In 1 week Ancil Boozer, Drue Stager, MD [facility], [facility]

## 2019-08-23 ENCOUNTER — Other Ambulatory Visit: Payer: Self-pay

## 2019-08-23 ENCOUNTER — Ambulatory Visit (INDEPENDENT_AMBULATORY_CARE_PROVIDER_SITE_OTHER): Payer: Commercial Managed Care - PPO | Admitting: Family Medicine

## 2019-08-23 ENCOUNTER — Encounter: Payer: Self-pay | Admitting: Family Medicine

## 2019-08-23 VITALS — BP 122/80 | HR 58 | Temp 96.6°F | Ht 62.0 in | Wt 194.0 lb

## 2019-08-23 DIAGNOSIS — F320 Major depressive disorder, single episode, mild: Secondary | ICD-10-CM

## 2019-08-23 DIAGNOSIS — I10 Essential (primary) hypertension: Secondary | ICD-10-CM | POA: Diagnosis not present

## 2019-08-23 DIAGNOSIS — G43009 Migraine without aura, not intractable, without status migrainosus: Secondary | ICD-10-CM | POA: Diagnosis not present

## 2019-08-23 DIAGNOSIS — F419 Anxiety disorder, unspecified: Secondary | ICD-10-CM | POA: Diagnosis not present

## 2019-08-23 MED ORDER — BUPROPION HCL ER (XL) 150 MG PO TB24: 150.0000 mg | ORAL_TABLET | Freq: Every day | ORAL | 0 refills | Status: DC

## 2019-08-23 MED ORDER — TOPIRAMATE 100 MG PO TABS: 100.0000 mg | ORAL_TABLET | Freq: Every evening | ORAL | 0 refills | Status: DC

## 2019-08-23 MED ORDER — SERTRALINE HCL 50 MG PO TABS: 50.0000 mg | ORAL_TABLET | Freq: Every day | ORAL | 0 refills | Status: DC

## 2019-08-23 NOTE — Progress Notes (Signed)
Name: Tammy Swanson   MRN: [id number]    DOB: [birth year]   Date:08/23/2019       Progress Note  Subjective  Chief Complaint  Chief Complaint  Patient presents with  . Follow-up    1 month F/U  . Depression    Current Medication has helped tremendously  . Headache    I connected with  Zenaida A Ward-Jeffries  on 08/23/19 at 11:00 AM EDT by a video enabled telemedicine application and verified that I am speaking with the correct person using two identifiers.  I discussed the limitations of evaluation and management by telemedicine and the availability of in person appointments. The patient expressed understanding and agreed to proceed. Staff also discussed with the patient that there may be a patient responsible charge related to this service. Patient Location: at home Provider Location: at [facility]  HPI    Depression: she was getting angry and losing her patience at home during her last visit in July She states she continues to have brain fog even since she resumed Zoloft but her mood has improved. She also has noticed that she is forgetful and difficulty learning new things. She denies snoring but feels sluggish She feels sad when she forgets things.   Migraine headaches: she has a history of migraines, but Spring 2020 she noticed recurrent and behind and below right eye, starts as a dull sensation in the mornings and gets more intense and throbbing, she has been taking Tylenol arthritis daily to help with symptoms and sometimes Aleve. No visual disturbance but she has photophobia and phonophobia with episodes, not associated with nausea or vomiting. When she was seen in July episodes were frequent, but since resumed Topamax only had one episode . No side effects of medications, weight is stable  HTN: DBP is elevated today, heart rate is at goal today , she is on Atenolol and also losartan hctz. She was given spironolactone for acne. No chest pain or  palpitation  Acne: she is seeing Dermatologist and was advised to start Spironolactone 100 mg but wanted to ask me first, that will help with bp. Advised her to start with half pill and monitor for dizziness, return in one month for labs and also bp check and flu shot  Patient Active Problem List   Diagnosis Date Noted  . History of carpal tunnel surgery of right wrist 12/06/2016  . Prediabetes 10/15/2016  . History of liver failure 08/24/2016  . Migraine without aura and without status migrainosus, not intractable 09/10/2015  . Eczema 09/10/2015  . Bad memory 06/01/2015  . Menopause 06/01/2015  . Cough variant asthma 06/01/2015  . Obesity (BMI 30-39.9) 06/01/2015  . Nodule, subcutaneous 06/01/2015  . Lesion of ulnar nerve 06/01/2015  . Vitiligo 06/01/2015  . Benign hypertension 09/03/2010    Past Surgical History:  Procedure Laterality Date  . ABDOMINAL EXPLORATION SURGERY     x3  . ABDOMINAL HYSTERECTOMY  2013  . ABDOMINAL HYSTERECTOMY  2014  . CARPAL TUNNEL RELEASE Right 12/06/2016   Procedure: CARPAL TUNNEL RELEASE;  Surgeon: Hessie Knows, MD;  Location: [facility] ORS;  Service: Orthopedics;  Laterality: Right;  . CHOLECYSTECTOMY    . FOOT SURGERY    . FOOT SURGERY Left 2007/2009   Dr. Elvina Mattes  . KNEE ARTHROSCOPY Left 1985  . KNEE SURGERY Left   . MANDIBLE FRACTURE SURGERY    . NASAL SINUS SURGERY    . NASAL SINUS SURGERY  2012  . RECONSTRUCTION MANDIBLE /  MAXILLA  1992  . TONSILLECTOMY    . TONSILLECTOMY  1981  . TRIGGER FINGER RELEASE      Family History  Problem Relation Age of Onset  . Hypertension Mother   . Cancer Mother        Breast   . HIV/AIDS Mother   . Breast cancer Mother   . Cancer Father   . Hypertension Sister   . Cancer Maternal Aunt        3 sisters with Breast Cancer  . Breast cancer Maternal Aunt   . Cancer Paternal Aunt   . Cancer Paternal Uncle   . Hypertension Maternal Grandmother   . Hypertension Maternal Grandfather   . Breast cancer  Maternal Aunt     Social History   Socioeconomic History  . Marital status: Married    Spouse name: Gaspar Bidding   . Number of children: 2  . Years of education: Not on file  . Highest education level: Bachelor's degree (e.g., BA, AB, BS)  Occupational History  . Occupation: Investment banker, corporate: [employer]  . Financial resource strain: Somewhat hard  . Food insecurity    Worry: Never true    Inability: Never true  . Transportation needs    Medical: No    Non-medical: No  Tobacco Use  . Smoking status: Never Smoker  . Smokeless tobacco: Never Used  Substance and Sexual Activity  . Alcohol use: Yes    Comment: occasionally  . Drug use: No  . Sexual activity: Yes    Partners: Male    Comment: Hysterectomy  Lifestyle  . Physical activity    Days per week: Not on file    Minutes per session: Not on file  . Stress: Not on file  Relationships  . Social Herbalist on phone: Not on file    Gets together: Not on file    Attends religious service: Not on file    Active member of club or organization: Not on file    Attends meetings of clubs or organizations: Not on file    Relationship status: Not on file  . Intimate partner violence    Fear of current or ex partner: No    Emotionally abused: No    Physically abused: No    Forced sexual activity: No  Other Topics Concern  . 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  Adapalene-Benzoyl Peroxide (EPIDUO FORTE) 0.3-2.5 % GEL, Epiduo Forte 0.3 %-2.5 % topical gel with pump, Disp: , Rfl:  .  atenolol (TENORMIN) 25 MG tablet, TAKE 1 TABLET (25 MG TOTAL) BY MOUTH DAILY. AT NIGHT, Disp: 90 tablet, Rfl: 0 .  cholecalciferol (VITAMIN D) 1000 units tablet, Take 1,000 Units by mouth daily., Disp: , Rfl:  .  Cyanocobalamin (B-12) 1000 MCG SUBL, Place 1 tablet under the tongue daily., Disp: , Rfl:  .  estradiol (VIVELLE-DOT) 0.05 MG/24HR  patch, APPLY 1 PATCH TWICE A WEEK, Disp: , Rfl: 11 .  losartan-hydrochlorothiazide (HYZAAR) 50-12.5 MG tablet, TAKE 1 TABLET BY MOUTH EVERY DAY, Disp: 90 tablet, Rfl: 0 .  SUMAtriptan (IMITREX) 100 MG tablet, TAKE 1 TABLET BY MOUTH EVERY 2 HOURS AS NEEDED FOR MIGRAINE. MAY REPEAT IN 2 HOURS IF HEADACHE PERSISTS OR RECURS., Disp: 10 tablet, Rfl: 0 .  SYMBICORT 80-4.5 MCG/ACT inhaler, TAKE 2 PUFFS BY MOUTH TWICE A  DAY, Disp: 10.2 Inhaler, Rfl: 0 .  tacrolimus (PROTOPIC) 0.1 % ointment, Apply 1 application topically daily., Disp: 300 g, Rfl: 1 .  topiramate (TOPAMAX) 50 MG tablet, START WITH 1 TABLET BY MOUTH AT NIGHT FOR 3 DAYS, THEN 2 TABLETS AT NIGHT THEREAFTER, Disp: 60 tablet, Rfl: 0 .  albuterol (VENTOLIN HFA) 108 (90 Base) MCG/ACT inhaler, Inhale 2 puffs into the lungs every 6 (six) hours as needed for wheezing or shortness of breath., Disp: 1 Inhaler, Rfl: 2 .  Semaglutide, 1 MG/DOSE, (OZEMPIC, 1 MG/DOSE,) 2 MG/1.5ML SOPN, Inject 1 mg into the skin once a week. (Patient not taking: Reported on 08/23/2019), Disp: 9 mL, Rfl: 0 .  sertraline (ZOLOFT) 50 MG tablet, Take 1 tablet (50 mg total) by mouth daily. (Patient not taking: Reported on 08/23/2019), Disp: 90 tablet, Rfl: 0  Allergies  Allergen Reactions  . Latex Anaphylaxis  . Belviq [Lorcaserin Hcl]     Dry mouth, palpitation  . Vicodin [Hydrocodone-Acetaminophen] Hives    I personally reviewed active problem list, medication list, allergies, family history, social history with the patient/caregiver today.   ROS  Ten systems reviewed and is negative except as mentioned in HPI   Objective  Virtual encounter, vitals done at home.  Today's Vitals   08/23/19 1045  BP: 122/80  Pulse: (!) 58  Temp: (!) 96.6 F (35.9 C)  Weight: 194 lb (88 kg)  Height: 5\' 2"  (1.575 m)   Body mass index is 35.48 kg/m.   Body mass index is 35.48 kg/m.  Physical Exam  Awake, alert and oriented   PHQ2/9: Depression screen [facility] 2/9 08/23/2019  06/21/2019 02/25/2019 01/18/2019 10/19/2018  Decreased Interest 0 0 1 0 0  Down, Depressed, Hopeless 0 0 0 1 0  PHQ - 2 Score 0 0 1 1 0  Altered sleeping 0 0 1 1 0  Tired, decreased energy 0 0 1 1 1   Change in appetite 0 0 1 2 0  Feeling bad or failure about yourself  0 0 0 0 0  Trouble concentrating 0 0 2 2 1   Moving slowly or fidgety/restless 0 0 1 2 0  Suicidal thoughts 0 0 0 0 0  PHQ-9 Score 0 0 7 9 2   Difficult doing work/chores Not difficult at all - Not difficult at all Somewhat difficult Somewhat difficult  Some recent data might be hidden   PHQ-2/9 Result is negative.    Fall Risk: Fall Risk  08/23/2019 06/21/2019 02/25/2019 01/18/2019 08/20/2018  Falls in the past year? 0 0 0 0 Yes  Number falls in past yr: 0 0 0 - 2 or more  Injury with Fall? 0 0 0 - Yes  Comment - - - - Broke a bone in her right foot with one of the falls     Assessment & Plan  1. Migraine without aura and without status migrainosus, not intractable  - topiramate (TOPAMAX) 100 MG tablet; Take 1 tablet (100 mg total) by mouth every evening.  Dispense: 90 tablet; Refill: 0  2. Benign hypertension   3. Mild major depression (HCC)  - buPROPion (WELLBUTRIN XL) 150 MG 24 hr tablet; Take 1 tablet (150 mg total) by mouth daily. In the morning  Dispense: 30 tablet; Refill: 0 - sertraline (ZOLOFT) 50 MG tablet; Take 1 tablet (50 mg total) by mouth daily.  Dispense: 90 tablet; Refill: 0  4. Anxiety  - sertraline (ZOLOFT) 50 MG tablet; Take 1 tablet (50 mg total) by mouth daily.  Dispense:  90 tablet; Refill: 0  I discussed the assessment and treatment plan with the patient. The patient was provided an opportunity to ask questions and all were answered. The patient agreed with the plan and demonstrated an understanding of the instructions.  The patient was advised to call back or seek an in-person evaluation if the symptoms worsen or if the condition fails to improve as anticipated.  I provided 25 minutes of  non-face-to-face time during this encounter.

## 2019-09-09 ENCOUNTER — Ambulatory Visit
Admission: RE | Admit: 2019-09-09 | Discharge: 2019-09-09 | Disposition: A | Discharge status: Home / Self Care | Payer: Commercial Managed Care - PPO | Source: Ambulatory Visit | Attending: Obstetrics & Gynecology | Admitting: Obstetrics & Gynecology

## 2019-09-09 DIAGNOSIS — Z1231 Encounter for screening mammogram for malignant neoplasm of breast: Secondary | ICD-10-CM | POA: Diagnosis not present

## 2019-09-12 ENCOUNTER — Other Ambulatory Visit: Payer: Self-pay | Admitting: Family Medicine

## 2019-09-12 DIAGNOSIS — G43009 Migraine without aura, not intractable, without status migrainosus: Secondary | ICD-10-CM

## 2019-09-12 NOTE — Telephone Encounter (Signed)
Requested medication (s) are due for refill today: yes  Requested medication (s) are on the active medication list: yes  Last refill:  08/16/2019  Future visit scheduled: yes  Notes to clinic: review for refill   Requested Prescriptions  Pending Prescriptions Disp Refills   SUMAtriptan (IMITREX) 100 MG tablet [Pharmacy Med Name: SUMATRIPTAN SUCC 100 MG TABLET] 10 tablet 0    Sig: TAKE 1 TABLET BY MOUTH EVERY 2 HOURS AS NEEDED FOR MIGRAINE. MAY REPEAT IN 2 HOURS IF HEADACHE PERSISTS OR RECURS.     Neurology:  Migraine Therapy - Triptan Passed - 09/12/2019  1:32 AM      Passed - Last BP in normal range    BP Readings from Last 1 Encounters:  08/23/19 122/80         Passed - Valid encounter within last 12 months    Recent Outpatient Visits          2 weeks ago Migraine without aura and without status migrainosus, not intractable   [facility] [place], Drue Stager, MD   2 months ago Migraine without aura and without status migrainosus, not intractable   [facility] Steele Sizer, MD   6 months ago Essential hypertension   [facility] [place], Drue Stager, MD   7 months ago Obesity (BMI 30-39.9)   [facility] Steele Sizer, MD   10 months ago Essential hypertension   [facility] [place], Drue Stager, MD      Future Appointments            In 1 week Steele Sizer, MD [facility]            topiramate (TOPAMAX) 50 MG tablet [Pharmacy Med Name: TOPIRAMATE 50 MG TABLET] 60 tablet 0    Sig: START WITH 1 TABLET BY MOUTH AT NIGHT FOR 3 DAYS, THEN 2 TABLETS AT NIGHT THEREAFTER     Not Delegated - Neurology: Anticonvulsants - topiramate & zonisamide Failed - 09/12/2019  1:32 AM      Failed - This refill cannot be delegated      Failed - Cr in normal range and within 360 days    Creat  Date Value Ref Range Status  10/14/2016 0.88 0.50 - 1.05 mg/dL Final    Comment:       For patients > or = 55 years of age: The upper reference limit for Creatinine is approximately 13% higher for people identified as African-American.      Creatinine, Ser  Date Value Ref Range Status  08/22/2018 0.83 0.57 - 1.00 mg/dL Final         Failed - CO2 in normal range and within 360 days    CO2  Date Value Ref Range Status  08/22/2018 24 20 - 29 mmol/L Final         Passed - Valid encounter within last 12 months    Recent Outpatient Visits          2 weeks ago Migraine without aura and without status migrainosus, not intractable   [facility] [place], Drue Stager, MD   2 months ago Migraine without aura and without status migrainosus, not intractable   [facility] Steele Sizer, MD   6 months ago Essential hypertension   [facility] [place], Drue Stager, MD   7 months ago Obesity (BMI 30-39.9)   [facility] Steele Sizer, MD   10 months ago Essential hypertension  [facility] Steele Sizer, MD      Future Appointments            In 1 week Steele Sizer, MD [facility]

## 2019-09-13 ENCOUNTER — Other Ambulatory Visit: Payer: Self-pay

## 2019-09-13 ENCOUNTER — Encounter: Payer: Self-pay | Admitting: Family Medicine

## 2019-09-13 ENCOUNTER — Ambulatory Visit (INDEPENDENT_AMBULATORY_CARE_PROVIDER_SITE_OTHER): Payer: Commercial Managed Care - PPO | Admitting: Family Medicine

## 2019-09-13 DIAGNOSIS — G43011 Migraine without aura, intractable, with status migrainosus: Secondary | ICD-10-CM

## 2019-09-13 MED ORDER — ONDANSETRON HCL 4 MG PO TABS: 4.0000 mg | ORAL_TABLET | Freq: Three times a day (TID) | ORAL | 0 refills | Status: DC | PRN

## 2019-09-13 MED ORDER — UBRELVY 100 MG PO TABS: 1.0000 | ORAL_TABLET | Freq: Every day | ORAL | 0 refills | Status: DC | PRN

## 2019-09-13 MED ORDER — PREDNISONE 10 MG PO TABS: 10.0000 mg | ORAL_TABLET | Freq: Every day | ORAL | 0 refills | Status: DC

## 2019-09-13 NOTE — Progress Notes (Signed)
Name: Tammy Swanson   MRN: [id number]    DOB: 05-May-1966   Date:09/13/2019       Progress Note  Subjective  Chief Complaint  Chief Complaint  Patient presents with  . Migraine    She has been having a migraine off and on since Sunday. Imitrex is not even touching her migraine. She feels like she is watching herself move around.     I connected with  Tammy Swanson  on 09/13/19 at  4:00 PM EDT by a telephone and verified that I am speaking with the correct person using two identifiers.  I discussed the limitations of evaluation and management by telemedicine and the availability of in person appointments. The patient expressed understanding and agreed to proceed. Staff also discussed with the patient that there may be a patient responsible charge related to this service. Patient Location: at home  Provider Location: [facility]    HPI  Migraine : she states symptoms started as a typical migraine, pain behind right eye, radiated to right side of head towards her neck. Pain was initially was sharp but now is dull and aching. , but is just not resolving. This episode started 5 days ago, and seems to be resolving but has a flare daily of pounding and throbbing sensation. She had one episode of scotomas. She took Imitrex two days in a row, taking Topamax and Aleve . She states she has been nauseated. No vomiting. She has still been able to work. She also has noticed phonophobia and photophobia. Pain level today is 5/10   Patient Active Problem List   Diagnosis Date Noted  . History of carpal tunnel surgery of right wrist 12/06/2016  . Prediabetes 10/15/2016  . History of liver failure 08/24/2016  . Migraine without aura and without status migrainosus, not intractable 09/10/2015  . Eczema 09/10/2015  . Bad memory 06/01/2015  . Menopause 06/01/2015  . Cough variant asthma 06/01/2015  . Obesity (BMI 30-39.9) 06/01/2015  . Nodule, subcutaneous 06/01/2015  .  Lesion of ulnar nerve 06/01/2015  . Vitiligo 06/01/2015  . Benign hypertension 09/03/2010    Past Surgical History:  Procedure Laterality Date  . ABDOMINAL EXPLORATION SURGERY     x3  . ABDOMINAL HYSTERECTOMY  2013  . ABDOMINAL HYSTERECTOMY  2014  . CARPAL TUNNEL RELEASE Right 12/06/2016   Procedure: CARPAL TUNNEL RELEASE;  Surgeon: Hessie Knows, MD;  Location: [facility] ORS;  Service: Orthopedics;  Laterality: Right;  . CHOLECYSTECTOMY    . FOOT SURGERY    . FOOT SURGERY Left 2007/2009   Dr. Elvina Mattes  . KNEE ARTHROSCOPY Left 1985  . KNEE SURGERY Left   . MANDIBLE FRACTURE SURGERY    . NASAL SINUS SURGERY    . NASAL SINUS SURGERY  2012  . RECONSTRUCTION MANDIBLE / MAXILLA  1992  . TONSILLECTOMY    . TONSILLECTOMY  1981  . TRIGGER FINGER RELEASE      Family History  Problem Relation Age of Onset  . Hypertension Mother   . Cancer Mother        Breast   . HIV/AIDS Mother   . Breast cancer Mother   . Cancer Father   . Hypertension Sister   . Cancer Maternal Aunt        3 sisters with Breast Cancer  . Breast cancer Maternal Aunt   . Cancer Paternal Aunt   . Cancer Paternal Uncle   . Hypertension Maternal Grandmother   . Hypertension Maternal Grandfather   .  Breast cancer Maternal Aunt     Social History   Socioeconomic History  . Marital status: Married    Spouse name: Gaspar Bidding   . Number of children: 2  . Years of education: Not on file  . Highest education level: Bachelor's degree (e.g., BA, AB, BS)  Occupational History  . Occupation: Investment banker, corporate: [employer]  . Financial resource strain: Somewhat hard  . Food insecurity    Worry: Never true    Inability: Never true  . Transportation needs    Medical: No    Non-medical: No  Tobacco Use  . Smoking status: Never Smoker  . Smokeless tobacco: Never Used  Substance and Sexual Activity  . Alcohol use: Yes    Comment: occasionally  . Drug use: No  . Sexual activity: Yes    Partners: Male     Comment: Hysterectomy  Lifestyle  . Physical activity    Days per week: Not on file    Minutes per session: Not on file  . Stress: Not on file  Relationships  . Social Herbalist on phone: Not on file    Gets together: Not on file    Attends religious service: Not on file    Active member of club or organization: Not on file    Attends meetings of clubs or organizations: Not on file    Relationship status: Not on file  . Intimate partner violence    Fear of current or ex partner: No    Emotionally abused: No    Physically abused: No    Forced sexual activity: No  Other Topics Concern  . Not on file  Social History Narrative   She is a Chiropractor for [employer]     Current Outpatient Medications:  .  Adapalene-Benzoyl Peroxide (EPIDUO FORTE) 0.3-2.5 % GEL, Epiduo Forte 0.3 %-2.5 % topical gel with pump, Disp: , Rfl:  .  atenolol (TENORMIN) 25 MG tablet, TAKE 1 TABLET (25 MG TOTAL) BY MOUTH DAILY. AT NIGHT, Disp: 90 tablet, Rfl: 0 .  buPROPion (WELLBUTRIN XL) 150 MG 24 hr tablet, Take 1 tablet (150 mg total) by mouth daily. In the morning, Disp: 30 tablet, Rfl: 0 .  cholecalciferol (VITAMIN D) 1000 units tablet, Take 1,000 Units by mouth daily., Disp: , Rfl:  .  Cyanocobalamin (B-12) 1000 MCG SUBL, Place 1 tablet under the tongue daily., Disp: , Rfl:  .  estradiol (VIVELLE-DOT) 0.05 MG/24HR patch, APPLY 1 PATCH TWICE A WEEK, Disp: , Rfl: 11 .  losartan-hydrochlorothiazide (HYZAAR) 50-12.5 MG tablet, TAKE 1 TABLET BY MOUTH EVERY DAY, Disp: 90 tablet, Rfl: 0 .  sertraline (ZOLOFT) 50 MG tablet, Take 1 tablet (50 mg total) by mouth daily., Disp: 90 tablet, Rfl: 0 .  spironolactone (ALDACTONE) 100 MG tablet, Take 100 mg by mouth daily., Disp: , Rfl:  .  SUMAtriptan (IMITREX) 100 MG tablet, TAKE 1 TABLET BY MOUTH EVERY 2 HOURS AS NEEDED FOR MIGRAINE. MAY REPEAT IN 2 HOURS IF HEADACHE PERSISTS OR RECURS., Disp: 10 tablet, Rfl: 0 .  SYMBICORT 80-4.5 MCG/ACT inhaler, TAKE 2  PUFFS BY MOUTH TWICE A DAY, Disp: 10.2 Inhaler, Rfl: 0 .  tacrolimus (PROTOPIC) 0.1 % ointment, Apply 1 application topically daily., Disp: 300 g, Rfl: 1 .  topiramate (TOPAMAX) 100 MG tablet, Take 1 tablet (100 mg total) by mouth every evening., Disp: 90 tablet, Rfl: 0 .  albuterol (VENTOLIN HFA) 108 (90 Base) MCG/ACT inhaler,  Inhale 2 puffs into the lungs every 6 (six) hours as needed for wheezing or shortness of breath., Disp: 1 Inhaler, Rfl: 2 .  topiramate (TOPAMAX) 50 MG tablet, START WITH 1 TABLET BY MOUTH AT NIGHT FOR 3 DAYS, THEN 2 TABLETS AT NIGHT THEREAFTER (Patient not taking: Reported on 09/13/2019), Disp: 60 tablet, Rfl: 0  Allergies  Allergen Reactions  . Latex Anaphylaxis  . Belviq [Lorcaserin Hcl]     Dry mouth, palpitation  . Vicodin [Hydrocodone-Acetaminophen] Hives    I personally reviewed active problem list, medication list, allergies, family history, social history, health maintenance with the patient/caregiver today.   ROS  Ten systems reviewed and is negative except as mentioned in HPI   Objective  Virtual encounter, vitals not obtained.  There is no height or weight on file to calculate BMI.  Physical Exam  Awake, alert and oriented   PHQ2/9: Depression screen [facility] 2/9 09/13/2019 08/23/2019 06/21/2019 02/25/2019 01/18/2019  Decreased Interest 1 0 0 1 0  Down, Depressed, Hopeless 1 0 0 0 1  PHQ - 2 Score 2 0 0 1 1  Altered sleeping 0 0 0 1 1  Tired, decreased energy 2 0 0 1 1  Change in appetite 0 0 0 1 2  Feeling bad or failure about yourself  0 0 0 0 0  Trouble concentrating 1 0 0 2 2  Moving slowly or fidgety/restless 0 0 0 1 2  Suicidal thoughts 0 0 0 0 0  PHQ-9 Score 5 0 0 7 9  Difficult doing work/chores Somewhat difficult Not difficult at all - Not difficult at all Somewhat difficult  Some recent data might be hidden   PHQ-2/9 Result is positive.    Fall Risk: Fall Risk  08/23/2019 06/21/2019 02/25/2019 01/18/2019 08/20/2018  Falls in the past year?  0 0 0 0 Yes  Number falls in past yr: 0 0 0 - 2 or more  Injury with Fall? 0 0 0 - Yes  Comment - - - - Broke a bone in her right foot with one of the falls     Assessment & Plan  1. Intractable migraine without aura and with status migrainosus  - ondansetron (ZOFRAN) 4 MG tablet; Take 1 tablet (4 mg total) by mouth every 8 (eight) hours as needed for nausea or vomiting.  Dispense: 10 tablet; Refill: 0 - Ubrogepant (UBRELVY) 100 MG TABS; Take 1 tablet by mouth daily as needed. To alternate with imitrex when needed  Dispense: 10 tablet; Refill: 0 - predniSONE (DELTASONE) 10 MG tablet; Take 1 tablet (10 mg total) by mouth daily with breakfast.  Dispense: 4 tablet; Refill: 0r.  Discussed red flags, seems to be her typical migraine  I discussed the assessment and treatment plan with the patient. The patient was provided an opportunity to ask questions and all were answered. The patient agreed with the plan and demonstrated an understanding of the instructions.  The patient was advised to call back or seek an in-person evaluation if the symptoms worsen or if the condition fails to improve as anticipated.  I provided 15 minutes of non-face-to-face time during this encounter.

## 2019-09-16 ENCOUNTER — Other Ambulatory Visit: Payer: Self-pay | Admitting: Family Medicine

## 2019-09-16 DIAGNOSIS — F320 Major depressive disorder, single episode, mild: Secondary | ICD-10-CM

## 2019-09-24 ENCOUNTER — Encounter: Payer: Self-pay | Admitting: Family Medicine

## 2019-09-24 ENCOUNTER — Ambulatory Visit: Payer: Commercial Managed Care - PPO | Admitting: Family Medicine

## 2019-09-24 ENCOUNTER — Other Ambulatory Visit: Payer: Self-pay

## 2019-09-24 VITALS — BP 120/80 | HR 71 | Temp 97.3°F | Resp 16 | Ht 62.0 in | Wt 186.6 lb

## 2019-09-24 DIAGNOSIS — I10 Essential (primary) hypertension: Secondary | ICD-10-CM | POA: Diagnosis not present

## 2019-09-24 DIAGNOSIS — G44031 Episodic paroxysmal hemicrania, intractable: Secondary | ICD-10-CM

## 2019-09-24 DIAGNOSIS — F320 Major depressive disorder, single episode, mild: Secondary | ICD-10-CM

## 2019-09-24 DIAGNOSIS — R519 Headache, unspecified: Secondary | ICD-10-CM | POA: Diagnosis not present

## 2019-09-24 DIAGNOSIS — G43009 Migraine without aura, not intractable, without status migrainosus: Secondary | ICD-10-CM

## 2019-09-24 DIAGNOSIS — E538 Deficiency of other specified B group vitamins: Secondary | ICD-10-CM

## 2019-09-24 DIAGNOSIS — Z8659 Personal history of other mental and behavioral disorders: Secondary | ICD-10-CM

## 2019-09-24 DIAGNOSIS — E559 Vitamin D deficiency, unspecified: Secondary | ICD-10-CM

## 2019-09-24 DIAGNOSIS — Z13220 Encounter for screening for lipoid disorders: Secondary | ICD-10-CM

## 2019-09-24 MED ORDER — DIAZEPAM 5 MG PO TABS: 5.0000 mg | ORAL_TABLET | Freq: Four times a day (QID) | ORAL | 0 refills | Status: DC | PRN

## 2019-09-24 MED ORDER — LOSARTAN POTASSIUM-HCTZ 50-12.5 MG PO TABS: 1.0000 | ORAL_TABLET | Freq: Every day | ORAL | 0 refills | Status: DC

## 2019-09-24 MED ORDER — ATENOLOL 25 MG PO TABS: 25.0000 mg | ORAL_TABLET | Freq: Every day | ORAL | 0 refills | Status: DC

## 2019-09-24 MED ORDER — BUPROPION HCL ER (XL) 150 MG PO TB24: 150.0000 mg | ORAL_TABLET | Freq: Every day | ORAL | 0 refills | Status: DC

## 2019-09-24 NOTE — Progress Notes (Signed)
Name: Tammy Swanson   MRN: [id number]    DOB: 1966/05/19   Date:09/24/2019       Progress Note  Subjective  Chief Complaint  Chief Complaint  Patient presents with  . Hypertension  . Migraine  . Prediabetes    HPI  Depression Mild Recurrent: he states the addition of Wellbutrin to Zoloft has really helped her. She states more energy, and mood is better. Phq 9 has improved  Headaches: she has a history of migraines as an young adult, she was doing well, however over the past couple of months the symptoms has returned, she states still throbbing, severe, at least twice a week, usually right side of head, she is very concerned because recently she has noticed that she wakes up during the night seeing flashes of lights and a severe headache. That is new for her and she is very concerned about it. No focal deficit, no fever or chills.   HTN: bp is at goal, no side effects of medication, no chest pain or palpitation    Patient Active Problem List   Diagnosis Date Noted  . History of carpal tunnel surgery of right wrist 12/06/2016  . Prediabetes 10/15/2016  . History of liver failure 08/24/2016  . Migraine without aura and without status migrainosus, not intractable 09/10/2015  . Eczema 09/10/2015  . Bad memory 06/01/2015  . Menopause 06/01/2015  . Cough variant asthma 06/01/2015  . Obesity (BMI 30-39.9) 06/01/2015  . Nodule, subcutaneous 06/01/2015  . Lesion of ulnar nerve 06/01/2015  . Vitiligo 06/01/2015  . Benign hypertension 09/03/2010    Past Surgical History:  Procedure Laterality Date  . ABDOMINAL EXPLORATION SURGERY     x3  . ABDOMINAL HYSTERECTOMY  2013  . ABDOMINAL HYSTERECTOMY  2014  . CARPAL TUNNEL RELEASE Right 12/06/2016   Procedure: CARPAL TUNNEL RELEASE;  Surgeon: Hessie Knows, MD;  Location: [facility] ORS;  Service: Orthopedics;  Laterality: Right;  . CHOLECYSTECTOMY    . FOOT SURGERY    . FOOT SURGERY Left 2007/2009   Dr. Elvina Mattes  . KNEE ARTHROSCOPY  Left 1985  . KNEE SURGERY Left   . MANDIBLE FRACTURE SURGERY    . NASAL SINUS SURGERY    . NASAL SINUS SURGERY  2012  . RECONSTRUCTION MANDIBLE / MAXILLA  1992  . TONSILLECTOMY    . TONSILLECTOMY  1981  . TRIGGER FINGER RELEASE      Family History  Problem Relation Age of Onset  . Hypertension Mother   . Cancer Mother        Breast   . HIV/AIDS Mother   . Breast cancer Mother   . Cancer Father   . Hypertension Sister   . Cancer Maternal Aunt        3 sisters with Breast Cancer  . Breast cancer Maternal Aunt   . Cancer Paternal Aunt   . Cancer Paternal Uncle   . Hypertension Maternal Grandmother   . Hypertension Maternal Grandfather   . Breast cancer Maternal Aunt     Social History   Socioeconomic History  . Marital status: Married    Spouse name: Gaspar Bidding   . Number of children: 2  . Years of education: Not on file  . Highest education level: Bachelor's degree (e.g., BA, AB, BS)  Occupational History  . Occupation: Investment banker, corporate: [employer]  . Financial resource strain: Somewhat hard  . Food insecurity    Worry: Never true  Inability: Never true  . Transportation needs    Medical: No    Non-medical: No  Tobacco Use  . Smoking status: Never Smoker  . Smokeless tobacco: Never Used  Substance and Sexual Activity  . Alcohol use: Yes    Comment: occasionally  . Drug use: No  . Sexual activity: Yes    Partners: Male    Comment: Hysterectomy  Lifestyle  . Physical activity    Days per week: Not on file    Minutes per session: Not on file  . Stress: Not on file  Relationships  . Social Herbalist on phone: Not on file    Gets together: Not on file    Attends religious service: Not on file    Active member of club or organization: Not on file    Attends meetings of clubs or organizations: Not on file    Relationship status: Not on file  . Intimate partner violence    Fear of current or ex partner: No    Emotionally  abused: No    Physically abused: No    Forced sexual activity: No  Other Topics Concern  . 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  Adapalene-Benzoyl Peroxide (EPIDUO FORTE) 0.3-2.5 % GEL, Epiduo Forte 0.3 %-2.5 % topical gel with pump, Disp: , Rfl:  .  atenolol (TENORMIN) 25 MG tablet, Take 1 tablet (25 mg total) by mouth daily. At night, Disp: 90 tablet, Rfl: 0 .  buPROPion (WELLBUTRIN XL) 150 MG 24 hr tablet, Take 1 tablet (150 mg total) by mouth daily. In the morning, Disp: 90 tablet, Rfl: 0 .  cholecalciferol (VITAMIN D) 1000 units tablet, Take 1,000 Units by mouth daily., Disp: , Rfl:  .  Cyanocobalamin (B-12) 1000 MCG SUBL, Place 1 tablet under the tongue daily., Disp: , Rfl:  .  estradiol (VIVELLE-DOT) 0.05 MG/24HR patch, APPLY 1 PATCH TWICE A WEEK, Disp: , Rfl: 11 .  losartan-hydrochlorothiazide (HYZAAR) 50-12.5 MG tablet, Take 1 tablet by mouth daily., Disp: 90 tablet, Rfl: 0 .  ondansetron (ZOFRAN) 4 MG tablet, Take 1 tablet (4 mg total) by mouth every 8 (eight) hours as needed for nausea or vomiting., Disp: 10 tablet, Rfl: 0 .  sertraline (ZOLOFT) 50 MG tablet, Take 1 tablet (50 mg total) by mouth daily., Disp: 90 tablet, Rfl: 0 .  spironolactone (ALDACTONE) 100 MG tablet, Take 100 mg by mouth daily., Disp: , Rfl:  .  SYMBICORT 80-4.5 MCG/ACT inhaler, TAKE 2 PUFFS BY MOUTH TWICE A DAY, Disp: 10.2 Inhaler, Rfl: 0 .  tacrolimus (PROTOPIC) 0.1 % ointment, Apply 1 application topically daily., Disp: 300 g, Rfl: 1 .  topiramate (TOPAMAX) 100 MG tablet, Take 1 tablet (100 mg total) by mouth every evening., Disp: 90 tablet, Rfl: 0 .  Ubrogepant (UBRELVY) 100 MG TABS, Take 1 tablet by mouth daily as needed. To alternate with imitrex when needed, Disp: 10 tablet, Rfl: 0 .  albuterol (VENTOLIN HFA) 108 (90 Base) MCG/ACT inhaler, Inhale 2 puffs into the lungs every 6 (six) hours as needed for wheezing or shortness  of breath., Disp: 1 Inhaler, Rfl: 2 .  diazepam (VALIUM) 5 MG tablet, Take 1 tablet (5 mg total) by mouth every 6 (six) hours as needed for anxiety., Disp: 2 tablet, Rfl: 0  Allergies  Allergen Reactions  . Latex Anaphylaxis  . Belviq [Lorcaserin Hcl]  Dry mouth, palpitation  . Imitrex [Sumatriptan]     Intolerance   . Vicodin [Hydrocodone-Acetaminophen] Hives    I personally reviewed active problem list, medication list, allergies, family history, social history, health maintenance with the patient/caregiver today.   ROS  Constitutional: Negative for fever or weight change.  Respiratory: Negative for cough and shortness of breath.   Cardiovascular: Negative for chest pain or palpitations.  Gastrointestinal: Negative for abdominal pain, no bowel changes.  Musculoskeletal: Negative for gait problem or joint swelling.  Skin: Negative for rash.  Neurological: Negative for dizziness, positive for  headache.  No other specific complaints in a complete review of systems (except as listed in HPI above).  Objective  Vitals:   09/24/19 1459  BP: 120/80  Pulse: 71  Resp: 16  Temp: (!) 97.3 F (36.3 C)  TempSrc: Temporal  SpO2: 97%  Weight: 186 lb 9.6 oz (84.6 kg)  Height: 5\' 2"  (1.575 m)    Body mass index is 34.13 kg/m.  Physical Exam  Constitutional: Patient appears well-developed and well-nourished. Overweight.  No distress.  HEENT: head atraumatic, normocephalic, pupils equal and reactive to light Cardiovascular: Normal rate, regular rhythm and normal heart sounds.  No murmur heard. No BLE edema. Pulmonary/Chest: Effort normal and breath sounds normal. No respiratory distress. Abdominal: Soft.  There is no tenderness. Neurological: no focal findings, normal cranial nerves  Psychiatric: Patient has a normal mood and affect. behavior is normal. Judgment and thought content normal.  Recent Results (from the past 2160 hour(s))  Hemoglobin A1c     Status: None    Collection Time: 07/30/19 12:00 AM  Result Value Ref Range   Hemoglobin A1C 5.9      PHQ2/9: Depression screen [facility] 2/9 09/24/2019 09/13/2019 08/23/2019 06/21/2019 02/25/2019  Decreased Interest 1 1 0 0 1  Down, Depressed, Hopeless 0 1 0 0 0  PHQ - 2 Score 1 2 0 0 1  Altered sleeping 0 0 0 0 1  Tired, decreased energy 1 2 0 0 1  Change in appetite 0 0 0 0 1  Feeling bad or failure about yourself  0 0 0 0 0  Trouble concentrating 1 1 0 0 2  Moving slowly or fidgety/restless 0 0 0 0 1  Suicidal thoughts 0 0 0 0 0  PHQ-9 Score 3 5 0 0 7  Difficult doing work/chores Not difficult at all Somewhat difficult Not difficult at all - Not difficult at all  Some recent data might be hidden    phq 9 is positive   Fall Risk: Fall Risk  09/24/2019 08/23/2019 06/21/2019 02/25/2019 01/18/2019  Falls in the past year? 0 0 0 0 0  Number falls in past yr: 0 0 0 0 -  Injury with Fall? 0 0 0 0 -  Comment - - - - -      Functional Status Survey: Is the patient deaf or have difficulty hearing?: No Does the patient have difficulty seeing, even when wearing glasses/contacts?: No Does the patient have difficulty concentrating, remembering, or making decisions?: Yes Does the patient have difficulty walking or climbing stairs?: No Does the patient have difficulty dressing or bathing?: No Does the patient have difficulty doing errands alone such as visiting a doctor's office or shopping?: No   Assessment & Plan  1. Mild major depression (HCC)  - buPROPion (WELLBUTRIN XL) 150 MG 24 hr tablet; Take 1 tablet (150 mg total) by mouth daily. In the morning  Dispense: 90 tablet; Refill: 0  2. Essential hypertension  - losartan-hydrochlorothiazide (HYZAAR) 50-12.5 MG tablet; Take 1 tablet by mouth daily.  Dispense: 90 tablet; Refill: 0 - atenolol (TENORMIN) 25 MG tablet; Take 1 tablet (25 mg total) by mouth daily. At night  Dispense: 90 tablet; Refill: 0 - COMPLETE METABOLIC PANEL WITH GFR - CBC with  Differential/Platelet  3. Worsening headaches  Discussed MRI  brain or referral to neurologist , she agreed on having a MRI  now   4. Migraine without aura and without status migrainosus, not intractable  Cannot tolerate IMitrex, but doing well on Ubrelvy prn, taking topamax every night and still has migraines   5. B12 deficiency  - Vitamin B12 - CBC with Differential/Platelet  6. Vitamin D deficiency  - VITAMIN D 25 Hydroxy (Vit-D Deficiency, Fractures)  7. Lipid screening  - Lipid panel

## 2019-09-30 ENCOUNTER — Other Ambulatory Visit: Payer: Self-pay | Admitting: Family Medicine

## 2019-09-30 DIAGNOSIS — F419 Anxiety disorder, unspecified: Secondary | ICD-10-CM

## 2019-09-30 DIAGNOSIS — F320 Major depressive disorder, single episode, mild: Secondary | ICD-10-CM

## 2019-09-30 NOTE — Telephone Encounter (Signed)
rx refill sertraline (ZOLOFT) 50 MG tablet  PHARMACY [id number] - [place], [place] - [address] [phone] (Phone) [phone] (Fax)

## 2019-10-02 LAB — CBC WITH DIFFERENTIAL/PLATELET
Basophils Absolute: 0.1 10*3/uL (ref 0.0–0.2)
Basos: 1 %
EOS (ABSOLUTE): 0.1 10*3/uL (ref 0.0–0.4)
Eos: 2 %
Hematocrit: 45.1 % (ref 34.0–46.6)
Hemoglobin: 14.9 g/dL (ref 11.1–15.9)
Immature Grans (Abs): 0 10*3/uL (ref 0.0–0.1)
Immature Granulocytes: 0 %
Lymphocytes Absolute: 1.8 10*3/uL (ref 0.7–3.1)
Lymphs: 26 %
MCH: 27.7 pg (ref 26.6–33.0)
MCHC: 33 g/dL (ref 31.5–35.7)
MCV: 84 fL (ref 79–97)
Monocytes Absolute: 0.6 10*3/uL (ref 0.1–0.9)
Monocytes: 8 %
Neutrophils Absolute: 4.3 10*3/uL (ref 1.4–7.0)
Neutrophils: 63 %
Platelets: 342 10*3/uL (ref 150–450)
RBC: 5.37 x10E6/uL — ABNORMAL HIGH (ref 3.77–5.28)
RDW: 13.1 % (ref 11.7–15.4)
WBC: 6.9 10*3/uL (ref 3.4–10.8)

## 2019-10-02 LAB — COMPREHENSIVE METABOLIC PANEL
ALT: 15 IU/L (ref 0–32)
AST: 13 IU/L (ref 0–40)
Albumin/Globulin Ratio: 1.6 (ref 1.2–2.2)
Albumin: 4.6 g/dL (ref 3.8–4.9)
Alkaline Phosphatase: 113 IU/L (ref 39–117)
BUN/Creatinine Ratio: 12 (ref 9–23)
BUN: 13 mg/dL (ref 6–24)
Bilirubin Total: 0.4 mg/dL (ref 0.0–1.2)
CO2: 20 mmol/L (ref 20–29)
Calcium: 10.2 mg/dL (ref 8.7–10.2)
Chloride: 108 mmol/L — ABNORMAL HIGH (ref 96–106)
Creatinine, Ser: 1.13 mg/dL — ABNORMAL HIGH (ref 0.57–1.00)
GFR calc Af Amer: 63 mL/min/{1.73_m2} (ref >59)
GFR calc non Af Amer: 55 mL/min/{1.73_m2} — ABNORMAL LOW (ref >59)
Globulin, Total: 2.8 g/dL (ref 1.5–4.5)
Glucose: 87 mg/dL (ref 65–99)
Potassium: 5 mmol/L (ref 3.5–5.2)
Sodium: 142 mmol/L (ref 134–144)
Total Protein: 7.4 g/dL (ref 6.0–8.5)

## 2019-10-02 LAB — LIPID PANEL
Chol/HDL Ratio: 3.1 ratio (ref 0.0–4.4)
Cholesterol, Total: 160 mg/dL (ref 100–199)
HDL: 52 mg/dL (ref >39)
LDL Chol Calc (NIH): 92 mg/dL (ref 0–99)
Triglycerides: 86 mg/dL (ref 0–149)
VLDL Cholesterol Cal: 16 mg/dL (ref 5–40)

## 2019-10-02 LAB — VITAMIN B12: Vitamin B-12: 932 pg/mL (ref 232–1245)

## 2019-10-02 LAB — VITAMIN D 25 HYDROXY (VIT D DEFICIENCY, FRACTURES): Vit D, 25-Hydroxy: 49.9 ng/mL (ref 30.0–100.0)

## 2019-10-06 ENCOUNTER — Other Ambulatory Visit: Payer: Self-pay | Admitting: Family Medicine

## 2019-10-06 DIAGNOSIS — G43009 Migraine without aura, not intractable, without status migrainosus: Secondary | ICD-10-CM

## 2019-10-07 NOTE — Telephone Encounter (Signed)
Requested medication (s) are due for refill today: no  Requested medication (s) are on the active medication list: yes  Last refill:  09/13/2019  Future visit scheduled: no  Notes to clinic:  Medications have been changed or discontinued Review for refill   Requested Prescriptions  Pending Prescriptions Disp Refills   topiramate (TOPAMAX) 50 MG tablet [Pharmacy Med Name: TOPIRAMATE 50 MG TABLET] 60 tablet 0    Sig: START WITH 1 TABLET BY MOUTH AT NIGHT FOR 3 DAYS, THEN 2 TABLETS AT NIGHT THEREAFTER     Not Delegated - Neurology: Anticonvulsants - topiramate & zonisamide Failed - 10/06/2019  9:57 AM      Failed - This refill cannot be delegated      Failed - Cr in normal range and within 360 days    Creat  Date Value Ref Range Status  10/14/2016 0.88 0.50 - 1.05 mg/dL Final    Comment:      For patients > or = 55 years of age: The upper reference limit for Creatinine is approximately 13% higher for people identified as African-American.      Creatinine, Ser  Date Value Ref Range Status  10/01/2019 1.13 (H) 0.57 - 1.00 mg/dL Final         Passed - CO2 in normal range and within 360 days    CO2  Date Value Ref Range Status  10/01/2019 20 20 - 29 mmol/L Final         Passed - Valid encounter within last 12 months    Recent Outpatient Visits          1 week ago Mild major depression [facility])   [facility] [place], Drue Stager, MD   3 weeks ago Intractable migraine without aura and with status migrainosus   [facility] [place], Drue Stager, MD   1 month ago Migraine without aura and without status migrainosus, not intractable   [facility] [place], Drue Stager, MD   3 months ago Migraine without aura and without status migrainosus, not intractable   [facility] Steele Sizer, MD   7 months ago Essential hypertension   [facility] [place], Drue Stager, MD              SUMAtriptan  (IMITREX) 100 MG tablet [Pharmacy Med Name: SUMATRIPTAN SUCC 100 MG TABLET] 10 tablet 0    Sig: TAKE 1 TABLET AT ONSET OF MIGRAINE, MAY REPEAT IN 2 HOURS IF HEADACHE PERSISTS OR RECURS.     Neurology:  Migraine Therapy - Triptan Passed - 10/06/2019  9:57 AM      Passed - Last BP in normal range    BP Readings from Last 1 Encounters:  09/24/19 120/80         Passed - Valid encounter within last 12 months    Recent Outpatient Visits          1 week ago Mild major depression [facility])   [facility] [place], Drue Stager, MD   3 weeks ago Intractable migraine without aura and with status migrainosus   [facility] [place], Drue Stager, MD   1 month ago Migraine without aura and without status migrainosus, not intractable   [facility] [place], Drue Stager, MD   3 months ago Migraine without aura and without status migrainosus, not intractable   [facility] Steele Sizer, MD   7 months ago Essential hypertension   [facility] Steele Sizer, MD

## 2019-10-22 DIAGNOSIS — R519 Headache, unspecified: Secondary | ICD-10-CM | POA: Insufficient documentation

## 2019-10-22 DIAGNOSIS — G479 Sleep disorder, unspecified: Secondary | ICD-10-CM | POA: Insufficient documentation

## 2019-11-29 ENCOUNTER — Other Ambulatory Visit: Payer: Self-pay | Admitting: Family Medicine

## 2019-11-29 DIAGNOSIS — G43009 Migraine without aura, not intractable, without status migrainosus: Secondary | ICD-10-CM

## 2019-12-19 ENCOUNTER — Other Ambulatory Visit: Payer: Self-pay | Admitting: Family Medicine

## 2019-12-19 DIAGNOSIS — I10 Essential (primary) hypertension: Secondary | ICD-10-CM

## 2019-12-25 ENCOUNTER — Ambulatory Visit (INDEPENDENT_AMBULATORY_CARE_PROVIDER_SITE_OTHER): Payer: Commercial Managed Care - PPO | Admitting: Family Medicine

## 2019-12-25 ENCOUNTER — Other Ambulatory Visit: Payer: Self-pay

## 2019-12-25 ENCOUNTER — Encounter: Payer: Self-pay | Admitting: Family Medicine

## 2019-12-25 VITALS — BP 127/93 | HR 73

## 2019-12-25 DIAGNOSIS — G43011 Migraine without aura, intractable, with status migrainosus: Secondary | ICD-10-CM

## 2019-12-25 DIAGNOSIS — F419 Anxiety disorder, unspecified: Secondary | ICD-10-CM

## 2019-12-25 DIAGNOSIS — F320 Major depressive disorder, single episode, mild: Secondary | ICD-10-CM | POA: Diagnosis not present

## 2019-12-25 DIAGNOSIS — R7303 Prediabetes: Secondary | ICD-10-CM

## 2019-12-25 DIAGNOSIS — J4530 Mild persistent asthma, uncomplicated: Secondary | ICD-10-CM

## 2019-12-25 DIAGNOSIS — I10 Essential (primary) hypertension: Secondary | ICD-10-CM

## 2019-12-25 DIAGNOSIS — E538 Deficiency of other specified B group vitamins: Secondary | ICD-10-CM

## 2019-12-25 DIAGNOSIS — E559 Vitamin D deficiency, unspecified: Secondary | ICD-10-CM

## 2019-12-25 MED ORDER — LOSARTAN POTASSIUM-HCTZ 50-12.5 MG PO TABS: 1.0000 | ORAL_TABLET | Freq: Every day | ORAL | 0 refills | Status: DC

## 2019-12-25 MED ORDER — UBRELVY 100 MG PO TABS: 1.0000 | ORAL_TABLET | Freq: Every day | ORAL | 0 refills | Status: DC | PRN

## 2019-12-25 MED ORDER — NADOLOL 20 MG PO TABS: 20.0000 mg | ORAL_TABLET | Freq: Every day | ORAL | 0 refills | Status: DC

## 2019-12-25 MED ORDER — SERTRALINE HCL 50 MG PO TABS: 50.0000 mg | ORAL_TABLET | Freq: Every day | ORAL | 0 refills | Status: DC

## 2019-12-25 MED ORDER — BUPROPION HCL ER (XL) 150 MG PO TB24: 150.0000 mg | ORAL_TABLET | Freq: Every day | ORAL | 0 refills | Status: DC

## 2019-12-25 MED ORDER — ALBUTEROL SULFATE HFA 108 (90 BASE) MCG/ACT IN AERS: 2.0000 | INHALATION_SPRAY | Freq: Four times a day (QID) | RESPIRATORY_TRACT | 0 refills | Status: DC | PRN

## 2019-12-25 NOTE — Progress Notes (Signed)
Name: Tammy Swanson   MRN: [id number]    DOB: 1966-03-27   Date:12/25/2019       Progress Note  Subjective  Chief Complaint  Chief Complaint  Patient presents with  . Medication Refill    topamax was giving her mental fogginess and quit taking it but still having migraines  . Depression  . Headache    seeing Dr. Melrose Nakayama [facility]  . Hypertension    Denies any symptoms    I connected with  Tammy Swanson on 12/25/19 at  2:00 PM EST by telephone and verified that I am speaking with the correct person using two identifiers.  I discussed the limitations, risks, security and privacy concerns of performing an evaluation and management service by telephone and the availability of in person appointments. Staff also discussed with the patient that there may be a patient responsible charge related to this service. Patient Location: at home  Provider Location: [facility]   HPI  Depression Mild Recurrent: he states the addition of Wellbutrin to Zoloft has really helped her. She states more energy, and mood is better. Phq 9 has improved  Headaches: she has a history of migraines as an young adult, she was doing well, however last Fall she noticed  symptoms returned. We tried Topamax and it was working but it caused brain fog and because of severe symptoms  she was referred to Dr. Melrose Swanson, she is now off Topamax and taking Nortriptyline 30 mg every night  she states still throbbing, severe, usually right side of head. Sometimes sees bright lights. She states she is taking [place] about once every other week , but sometimes twice per in one week , stopped Imitrex since it is not as efficacious and also does not like side effects. We will change from Atenolol to Nadolol to see if it will improve bp and migraines  HTN: bp is at goal, no side effects of medication, no chest pain or palpitation Denies dizziness, headache . We will change from Atenolol to Nadolol    Pre-diabetes: no polyphagia, polydipsia or polyuria.   Vitamin D and B12 deficiency: doing well on otc supplementation , reviewed last labs    Patient Active Problem List   Diagnosis Date Noted  . History of carpal tunnel surgery of right wrist 12/06/2016  . Prediabetes 10/15/2016  . History of liver failure 08/24/2016  . Migraine without aura and without status migrainosus, not intractable 09/10/2015  . Eczema 09/10/2015  . Bad memory 06/01/2015  . Menopause 06/01/2015  . Cough variant asthma 06/01/2015  . Obesity (BMI 30-39.9) 06/01/2015  . Nodule, subcutaneous 06/01/2015  . Lesion of ulnar nerve 06/01/2015  . Vitiligo 06/01/2015  . Benign hypertension 09/03/2010    Past Surgical History:  Procedure Laterality Date  . ABDOMINAL EXPLORATION SURGERY     x3  . ABDOMINAL HYSTERECTOMY  2013  . ABDOMINAL HYSTERECTOMY  2014  . CARPAL TUNNEL RELEASE Right 12/06/2016   Procedure: CARPAL TUNNEL RELEASE;  Surgeon: Hessie Knows, MD;  Location: [facility] ORS;  Service: Orthopedics;  Laterality: Right;  . CHOLECYSTECTOMY    . FOOT SURGERY    . FOOT SURGERY Left 2007/2009   Dr. Elvina Mattes  . KNEE ARTHROSCOPY Left 1985  . KNEE SURGERY Left   . MANDIBLE FRACTURE SURGERY    . NASAL SINUS SURGERY    . NASAL SINUS SURGERY  2012  . RECONSTRUCTION MANDIBLE / MAXILLA  1992  . TONSILLECTOMY    . TONSILLECTOMY  1981  .  TRIGGER FINGER RELEASE      Family History  Problem Relation Age of Onset  . Hypertension Mother   . Cancer Mother        Breast   . HIV/AIDS Mother   . Breast cancer Mother   . Cancer Father   . Hypertension Sister   . Cancer Maternal Aunt        3 sisters with Breast Cancer  . Breast cancer Maternal Aunt   . Cancer Paternal Aunt   . Cancer Paternal Uncle   . Hypertension Maternal Grandmother   . Hypertension Maternal Grandfather   . Breast cancer Maternal Aunt      Current Outpatient Medications:  .  Adapalene-Benzoyl Peroxide (EPIDUO FORTE) 0.3-2.5 % GEL,  Epiduo Forte 0.3 %-2.5 % topical gel with pump, Disp: , Rfl:  .  albuterol (VENTOLIN HFA) 108 (90 Base) MCG/ACT inhaler, Inhale 2 puffs into the lungs every 6 (six) hours as needed for wheezing or shortness of breath., Disp: 18 g, Rfl: 0 .  buPROPion (WELLBUTRIN XL) 150 MG 24 hr tablet, Take 1 tablet (150 mg total) by mouth daily. In the morning, Disp: 90 tablet, Rfl: 0 .  cholecalciferol (VITAMIN D) 1000 units tablet, Take 1,000 Units by mouth daily., Disp: , Rfl:  .  Cyanocobalamin (B-12) 1000 MCG SUBL, Place 1 tablet under the tongue daily., Disp: , Rfl:  .  estradiol (VIVELLE-DOT) 0.05 MG/24HR patch, APPLY 1 PATCH TWICE A WEEK, Disp: , Rfl: 11 .  losartan-hydrochlorothiazide (HYZAAR) 50-12.5 MG tablet, Take 1 tablet by mouth daily., Disp: 90 tablet, Rfl: 0 .  nortriptyline (PAMELOR) 10 MG capsule, Take 30 mg at night, Disp: , Rfl:  .  ondansetron (ZOFRAN) 4 MG tablet, Take 1 tablet (4 mg total) by mouth every 8 (eight) hours as needed for nausea or vomiting., Disp: 10 tablet, Rfl: 0 .  sertraline (ZOLOFT) 50 MG tablet, Take 1 tablet (50 mg total) by mouth daily., Disp: 90 tablet, Rfl: 0 .  spironolactone (ALDACTONE) 100 MG tablet, Take 100 mg by mouth daily., Disp: , Rfl:  .  SYMBICORT 80-4.5 MCG/ACT inhaler, TAKE 2 PUFFS BY MOUTH TWICE A DAY, Disp: 10.2 Inhaler, Rfl: 0 .  tacrolimus (PROTOPIC) 0.1 % ointment, Apply 1 application topically daily., Disp: 300 g, Rfl: 1 .  Ubrogepant (UBRELVY) 100 MG TABS, Take 1 tablet by mouth daily as needed. To alternate with imitrex when needed, Disp: 10 tablet, Rfl: 0 .  nadolol (CORGARD) 20 MG tablet, Take 1 tablet (20 mg total) by mouth daily. In place of Atenolol, Disp: 90 tablet, Rfl: 0  Allergies  Allergen Reactions  . Latex Anaphylaxis  . Belviq [Lorcaserin Hcl]     Dry mouth, palpitation  . Imitrex [Sumatriptan]     Intolerance   . Vicodin [Hydrocodone-Acetaminophen] Hives    I personally reviewed active problem list, medication list,  allergies, family history, social history with the patient/caregiver today.   ROS  Ten systems reviewed and is negative except as mentioned in HPI   Objective  Virtual encounter, vitals not obtained.  There is no height or weight on file to calculate BMI.  Physical Exam  Awake, alert and oriented   PHQ2/9: Depression screen [facility] 2/9 12/25/2019 09/24/2019 09/13/2019 08/23/2019 06/21/2019  Decreased Interest 0 1 1 0 0  Down, Depressed, Hopeless 0 0 1 0 0  PHQ - 2 Score 0 1 2 0 0  Altered sleeping 0 0 0 0 0  Tired, decreased energy 1 1 2  0 0  Change in appetite 0 0 0 0 0  Feeling bad or failure about yourself  0 0 0 0 0  Trouble concentrating 1 1 1  0 0  Moving slowly or fidgety/restless 1 0 0 0 0  Suicidal thoughts 0 0 0 0 0  PHQ-9 Score 3 3 5  0 0  Difficult doing work/chores Not difficult at all Not difficult at all Somewhat difficult Not difficult at all -  Some recent data might be hidden   PHQ-2/9 Result is negative.    Fall Risk: Fall Risk  12/25/2019 09/24/2019 08/23/2019 06/21/2019 02/25/2019  Falls in the past year? 0 0 0 0 0  Number falls in past yr: 0 0 0 0 0  Injury with Fall? 0 0 0 0 0  Comment - - - - -     Assessment & Plan  1. Intractable migraine without aura and with status migrainosus  - Ubrogepant (UBRELVY) 100 MG TABS; Take 1 tablet by mouth daily as needed. To alternate with imitrex when needed  Dispense: 10 tablet; Refill: 0 - nadolol (CORGARD) 20 MG tablet; Take 1 tablet (20 mg total) by mouth daily. In place of Atenolol  Dispense: 90 tablet; Refill: 0  2. Mild major depression (HCC)  - buPROPion (WELLBUTRIN XL) 150 MG 24 hr tablet; Take 1 tablet (150 mg total) by mouth daily. In the morning  Dispense: 90 tablet; Refill: 0 - sertraline (ZOLOFT) 50 MG tablet; Take 1 tablet (50 mg total) by mouth daily.  Dispense: 90 tablet; Refill: 0  3. Anxiety  - sertraline (ZOLOFT) 50 MG tablet; Take 1 tablet (50 mg total) by mouth daily.  Dispense: 90 tablet;  Refill: 0  4. Essential hypertension  - nadolol (CORGARD) 20 MG tablet; Take 1 tablet (20 mg total) by mouth daily. In place of Atenolol  Dispense: 90 tablet; Refill: 0 - losartan-hydrochlorothiazide (HYZAAR) 50-12.5 MG tablet; Take 1 tablet by mouth daily.  Dispense: 90 tablet; Refill: 0  5. Vitamin D deficiency   6. B12 deficiency   7. Prediabetes   8. Mild persistent asthma without complication  - albuterol (VENTOLIN HFA) 108 (90 Base) MCG/ACT inhaler; Inhale 2 puffs into the lungs every 6 (six) hours as needed for wheezing or shortness of breath.  Dispense: 18 g; Refill: 0  I discussed the assessment and treatment plan with the patient. The patient was provided an opportunity to ask questions and all were answered. The patient agreed with the plan and demonstrated an understanding of the instructions.   The patient was advised to call back or seek an in-person evaluation if the symptoms worsen or if the condition fails to improve as anticipated.  I provided 25 minutes of non-face-to-face time during this encounter.  Loistine Chance, MD

## 2020-01-21 ENCOUNTER — Other Ambulatory Visit: Payer: Self-pay | Admitting: Family Medicine

## 2020-01-21 DIAGNOSIS — J4530 Mild persistent asthma, uncomplicated: Secondary | ICD-10-CM

## 2020-03-06 DIAGNOSIS — H53149 Visual discomfort, unspecified: Secondary | ICD-10-CM | POA: Insufficient documentation

## 2020-03-06 DIAGNOSIS — R110 Nausea: Secondary | ICD-10-CM | POA: Insufficient documentation

## 2020-03-06 DIAGNOSIS — F40298 Other specified phobia: Secondary | ICD-10-CM | POA: Insufficient documentation

## 2020-03-13 ENCOUNTER — Other Ambulatory Visit: Payer: Self-pay | Admitting: Family Medicine

## 2020-03-13 DIAGNOSIS — I10 Essential (primary) hypertension: Secondary | ICD-10-CM

## 2020-03-21 ENCOUNTER — Other Ambulatory Visit: Payer: Self-pay | Admitting: Family Medicine

## 2020-03-21 DIAGNOSIS — I10 Essential (primary) hypertension: Secondary | ICD-10-CM

## 2020-03-21 DIAGNOSIS — G43011 Migraine without aura, intractable, with status migrainosus: Secondary | ICD-10-CM

## 2020-03-21 NOTE — Telephone Encounter (Signed)
Requested Prescriptions  Pending Prescriptions Disp Refills  . nadolol (CORGARD) 20 MG tablet [Pharmacy Med Name: NADOLOL 20 MG TABLET] 90 tablet 0    Sig: TAKE 1 TABLET (20 MG TOTAL) BY MOUTH DAILY. IN PLACE OF ATENOLOL     Cardiovascular:  Beta Blockers Failed - 03/21/2020  9:36 AM      Failed - Last BP in normal range    BP Readings from Last 1 Encounters:  12/25/19 (!) 127/93         Passed - Last Heart Rate in normal range    Pulse Readings from Last 1 Encounters:  12/25/19 73         Passed - Valid encounter within last 6 months    Recent Outpatient Visits          2 months ago Essential hypertension   [facility] [place], Drue Stager, MD   5 months ago Mild major depression [facility])   [facility] [place], Drue Stager, MD   6 months ago Intractable migraine without aura and with status migrainosus   [facility] [place], Drue Stager, MD   7 months ago Migraine without aura and without status migrainosus, not intractable   [facility] [place], Drue Stager, MD   9 months ago Migraine without aura and without status migrainosus, not intractable   [facility] Steele Sizer, MD

## 2020-05-25 DIAGNOSIS — R410 Disorientation, unspecified: Secondary | ICD-10-CM | POA: Insufficient documentation

## 2020-06-12 ENCOUNTER — Other Ambulatory Visit: Payer: Self-pay | Admitting: Family Medicine

## 2020-06-12 DIAGNOSIS — G43011 Migraine without aura, intractable, with status migrainosus: Secondary | ICD-10-CM

## 2020-06-12 DIAGNOSIS — I10 Essential (primary) hypertension: Secondary | ICD-10-CM

## 2020-07-08 ENCOUNTER — Other Ambulatory Visit: Payer: Self-pay | Admitting: Family Medicine

## 2020-07-08 DIAGNOSIS — G43011 Migraine without aura, intractable, with status migrainosus: Secondary | ICD-10-CM

## 2020-07-08 DIAGNOSIS — I10 Essential (primary) hypertension: Secondary | ICD-10-CM

## 2020-07-08 NOTE — Telephone Encounter (Signed)
Pt is scheduled °

## 2020-07-08 NOTE — Telephone Encounter (Signed)
Requested medication (s) are due for refill today: no  Requested medication (s) are on the active medication list: yes  Last refill:  06/12/2020  Future visit scheduled:no  Notes to clinic:  Patient is overdue for follow up Courtesy refill given    Requested Prescriptions  Pending Prescriptions Disp Refills   nadolol (CORGARD) 20 MG tablet [Pharmacy Med Name: NADOLOL 20 MG TABLET] 30 tablet 0    Sig: TAKE 1 TABLET (20 MG TOTAL) BY MOUTH DAILY. IN PLACE OF ATENOLOL      Cardiovascular:  Beta Blockers Failed - 07/08/2020 11:32 AM      Failed - Last BP in normal range    BP Readings from Last 1 Encounters:  12/25/19 (!) 127/93          Failed - Valid encounter within last 6 months    Recent Outpatient Visits           6 months ago Essential hypertension   [facility] Sizer, MD   9 months ago Mild major depression [facility])   [facility] [place], Drue Stager, MD   9 months ago Intractable migraine without aura and with status migrainosus   [facility] [place], Drue Stager, MD   10 months ago Migraine without aura and without status migrainosus, not intractable   [facility] [place], Drue Stager, MD   1 year ago Migraine without aura and without status migrainosus, not intractable   [facility] [place], Drue Stager, MD              Passed - Last Heart Rate in normal range    Pulse Readings from Last 1 Encounters:  12/25/19 73

## 2020-08-04 ENCOUNTER — Other Ambulatory Visit: Payer: Self-pay | Admitting: Family Medicine

## 2020-08-04 DIAGNOSIS — F419 Anxiety disorder, unspecified: Secondary | ICD-10-CM

## 2020-08-04 DIAGNOSIS — F320 Major depressive disorder, single episode, mild: Secondary | ICD-10-CM

## 2020-08-24 NOTE — Progress Notes (Signed)
Name: Tammy Swanson   MRN: [id number]    DOB: 1966-04-26   Date:08/26/2020       Progress Note  Subjective  Chief Complaint  Follow up   HPI   Depression Mild Recurrent: she was on wellbutrin and zoloft and was doing well, however seen by Dr. Melrose Nakayama for migraine headaches that were intractable and he added effexor, and nortriptyline 50 mg at night. She states she is feeling better on effexor, he had suggested decreasing dose of effexor, but we will stop zoloft and wellbutrin and increase dose of Effexor.  Headaches: she has a history of migraines as an young adult, she was doing well, however last Fall she noticed  symptoms returned. We tried Topamax and it was working but it caused brain fog and because of severe symptoms  she was referred to Dr. Melrose Nakayama, she is now off Topamax and taking Nortriptyline 50 mg every night , Emgality monthly and is on Effexor and Nadolol. Migraine was   throbbing, severe, usually right side of head. She stopped Imitrex since it is not as efficacious and also does not like side effects. She states with the new regiment she has noticed episodes very seldom, currently only has aura about once a week and takes [place]. Aura described as shinning lights and vibration of her posterior scalp , she takes [place] and does not develop a headache if she takes it right away   HTN: bp is at goal, no side effects of medication, no chest pain or palpitation Denies dizziness . Nadolol has helped with migraine also , bp is at goal   Pre-diabetes: no polyphagia, polydipsia or polyuria. We will recheck labs   Vitamin D and B12 deficiency: doing well on otc supplementation , previous labs normal but she would like to have it rechecked it   GERD: she states he is not longer on Ozempic and has gained weight, having some early satiety and bloating , discussed referral to GI or starting medication for GERD, also discussed GERD diet , she will try changing her diet   Asthma mild  intermittent: she states present in the Spring and Fall, she goes back on Symbicort every Fall and every Spring, needs a refill today, she has mild cough, no wheezing or SOB at this time   Patient Active Problem List   Diagnosis Date Noted   Transient confusion 05/25/2020   Nausea 03/06/2020   Phonophobia 03/06/2020   Photophobia 03/06/2020   Headache disorder 10/22/2019   Sleep disorder 10/22/2019   History of carpal tunnel surgery of right wrist 12/06/2016   Prediabetes 10/15/2016   History of liver failure 08/24/2016   Migraine without aura and without status migrainosus, not intractable 09/10/2015   Eczema 09/10/2015   Bad memory 06/01/2015   Menopause 06/01/2015   Cough variant asthma 06/01/2015   Obesity (BMI 30-39.9) 06/01/2015   Nodule, subcutaneous 06/01/2015   Lesion of ulnar nerve 06/01/2015   Vitiligo 06/01/2015   Benign hypertension 09/03/2010    Past Surgical History:  Procedure Laterality Date   ABDOMINAL EXPLORATION SURGERY     x3   ABDOMINAL HYSTERECTOMY  2013   ABDOMINAL HYSTERECTOMY  2014   CARPAL TUNNEL RELEASE Right 12/06/2016   Procedure: CARPAL TUNNEL RELEASE;  Surgeon: Hessie Knows, MD;  Location: [facility] ORS;  Service: Orthopedics;  Laterality: Right;   CHOLECYSTECTOMY     FOOT SURGERY     FOOT SURGERY Left 2007/2009   Dr. Elvina Mattes   KNEE ARTHROSCOPY Left 1985  KNEE SURGERY Left    MANDIBLE FRACTURE SURGERY     NASAL SINUS SURGERY     NASAL SINUS SURGERY  2012   RECONSTRUCTION MANDIBLE / MAXILLA  1992   TONSILLECTOMY     TONSILLECTOMY  1981   TRIGGER FINGER RELEASE      Family History  Problem Relation Age of Onset   Hypertension Mother    Cancer Mother        Breast    HIV/AIDS Mother    Breast cancer Mother    Cancer Father    Hypertension Sister    Cancer Maternal Aunt        3 sisters with Breast Cancer   Breast cancer Maternal Aunt    Cancer Paternal Aunt    Cancer Paternal Uncle     Hypertension Maternal Grandmother    Hypertension Maternal Grandfather    Breast cancer Maternal Aunt     Social History   Tobacco Use   Smoking status: Never Smoker   Smokeless tobacco: Never Used  Substance Use Topics   Alcohol use: Yes    Comment: occasionally     Current Outpatient Medications:    Adapalene-Benzoyl Peroxide (EPIDUO FORTE) 0.3-2.5 % GEL, Epiduo Forte 0.3 %-2.5 % topical gel with pump, Disp: , Rfl:    budesonide-formoterol (SYMBICORT) 80-4.5 MCG/ACT inhaler, Inhale 2 puffs into the lungs in the morning and at bedtime., Disp: 10.2 each, Rfl: 2   cholecalciferol (VITAMIN D) 1000 units tablet, Take 1,000 Units by mouth daily., Disp: , Rfl:    Cyanocobalamin (B-12) 1000 MCG SUBL, Place 1 tablet under the tongue daily., Disp: , Rfl:    EMGALITY 120 MG/ML SOAJ, Inject 1 each into the skin every 30 (thirty) days., Disp: , Rfl:    estradiol (VIVELLE-DOT) 0.05 MG/24HR patch, APPLY 1 PATCH TWICE A WEEK, Disp: , Rfl: 11   losartan-hydrochlorothiazide (HYZAAR) 50-12.5 MG tablet, Take 1 tablet by mouth daily., Disp: 90 tablet, Rfl: 0   nadolol (CORGARD) 20 MG tablet, Take 1 tablet (20 mg total) by mouth daily. In place of Atenolol, Disp: 90 tablet, Rfl: 1   nortriptyline (PAMELOR) 50 MG capsule, Take 50 mg by mouth at bedtime., Disp: , Rfl:    ondansetron (ZOFRAN) 4 MG tablet, Take 1 tablet (4 mg total) by mouth every 8 (eight) hours as needed for nausea or vomiting., Disp: 10 tablet, Rfl: 0   tacrolimus (PROTOPIC) 0.1 % ointment, Apply 1 application topically daily., Disp: 300 g, Rfl: 1   Ubrogepant (UBRELVY) 100 MG TABS, Take 1 tablet by mouth daily as needed. To alternate with imitrex when needed, Disp: 10 tablet, Rfl: 0   albuterol (VENTOLIN HFA) 108 (90 Base) MCG/ACT inhaler, Inhale 2 puffs into the lungs every 6 (six) hours as needed for wheezing or shortness of breath., Disp: 18 g, Rfl: 0   venlafaxine XR (EFFEXOR XR) 150 MG 24 hr capsule, Take 1 capsule  (150 mg total) by mouth daily with breakfast., Disp: 90 capsule, Rfl: 1  Allergies  Allergen Reactions   Latex Anaphylaxis   Belviq [Lorcaserin Hcl]     Dry mouth, palpitation   Imitrex [Sumatriptan]     Intolerance    Vicodin [Hydrocodone-Acetaminophen] Hives    I personally reviewed active problem list, medication list, allergies, family history, social history, health maintenance with the patient/caregiver today.   ROS  Constitutional: Negative for fever , positive for weight change.  Respiratory: Negative for cough and shortness of breath.   Cardiovascular: Negative for chest pain  or palpitations.  Gastrointestinal: Negative for abdominal pain, no bowel changes.  Musculoskeletal: Negative for gait problem or joint swelling.  Skin: Negative for rash.  Neurological: Negative for dizziness , positive for  headache.  No other specific complaints in a complete review of systems (except as listed in HPI above).  Objective  Vitals:   08/26/20 1316  BP: 112/80  Pulse: 96  Resp: 16  Temp: 98.3 F (36.8 C)  TempSrc: Oral  SpO2: 95%  Weight: 201 lb 8 oz (91.4 kg)  Height: 5\' 2"  (1.575 m)    Body mass index is 36.85 kg/m.  Physical Exam  Constitutional: Patient appears well-developed and well-nourished. Obese No distress.  HEENT: head atraumatic, normocephalic, pupils equal and reactive to light,  neck supple Cardiovascular: Normal rate, regular rhythm and normal heart sounds.  No murmur heard. No BLE edema. Pulmonary/Chest: Effort normal and breath sounds normal. No respiratory distress. Abdominal: Soft.  There is no tenderness. Psychiatric: Patient has a normal mood and affect. behavior is normal. Judgment and thought content normal.  PHQ2/9: Depression screen [facility] 2/9 08/26/2020 12/25/2019 09/24/2019 09/13/2019 08/23/2019  Decreased Interest 0 0 1 1 0  Down, Depressed, Hopeless 0 0 0 1 0  PHQ - 2 Score 0 0 1 2 0  Altered sleeping 0 0 0 0 0  Tired, decreased energy  2 1 1 2  0  Change in appetite 1 0 0 0 0  Feeling bad or failure about yourself  0 0 0 0 0  Trouble concentrating 3 1 1 1  0  Moving slowly or fidgety/restless 0 1 0 0 0  Suicidal thoughts 0 0 0 0 0  PHQ-9 Score 6 3 3 5  0  Difficult doing work/chores Somewhat difficult Not difficult at all Not difficult at all Somewhat difficult Not difficult at all  Some recent data might be hidden    phq 9 is positive and negative   Fall Risk: Fall Risk  08/26/2020 12/25/2019 09/24/2019 08/23/2019 06/21/2019  Falls in the past year? 0 0 0 0 0  Number falls in past yr: 0 0 0 0 0  Injury with Fall? 0 0 0 0 0  Comment - - - - -     Functional Status Survey: Is the patient deaf or have difficulty hearing?: No Does the patient have difficulty seeing, even when wearing glasses/contacts?: No Does the patient have difficulty concentrating, remembering, or making decisions?: Yes Does the patient have difficulty walking or climbing stairs?: No Does the patient have difficulty dressing or bathing?: No Does the patient have difficulty doing errands alone such as visiting a doctor's office or shopping?: No    Assessment & Plan   1. Essential hypertension  - nadolol (CORGARD) 20 MG tablet; Take 1 tablet (20 mg total) by mouth daily. In place of Atenolol  Dispense: 90 tablet; Refill: 1 - CBC with Differential/Platelet - Comprehensive metabolic panel  2. Vitamin D deficiency  - VITAMIN D 25 Hydroxy (Vit-D Deficiency, Fractures)  3. B12 deficiency  - Vitamin B12  4. Mild persistent asthma without complication  - budesonide-formoterol (SYMBICORT) 80-4.5 MCG/ACT inhaler; Inhale 2 puffs into the lungs in the morning and at bedtime.  Dispense: 10.2 each; Refill: 2  5. Prediabetes  - Hemoglobin A1c  6. Lipid screening  - Lipid panel  7. Migraine with aura and without status migrainosus, not intractable  - EMGALITY 120 MG/ML SOAJ; Inject 1 each into the skin every 30 (thirty) days. - venlafaxine  XR (EFFEXOR XR) 150  MG 24 hr capsule; Take 1 capsule (150 mg total) by mouth daily with breakfast.  Dispense: 90 capsule; Refill: 1 - nadolol (CORGARD) 20 MG tablet; Take 1 tablet (20 mg total) by mouth daily. In place of Atenolol  Dispense: 90 tablet; Refill: 1 - ondansetron (ZOFRAN) 4 MG tablet; Take 1 tablet (4 mg total) by mouth every 8 (eight) hours as needed for nausea or vomiting.  Dispense: 10 tablet; Refill: 0  8. Anxiety  - venlafaxine XR (EFFEXOR XR) 150 MG 24 hr capsule; Take 1 capsule (150 mg total) by mouth daily with breakfast.  Dispense: 90 capsule; Refill: 1  9. Mild major depression (HCC)  - venlafaxine XR (EFFEXOR XR) 150 MG 24 hr capsule; Take 1 capsule (150 mg total) by mouth daily with breakfast.  Dispense: 90 capsule; Refill: 1  10. Need for immunization against influenza  - Flu Vaccine QUAD 36+ mos IM  11. Cough variant asthma  - budesonide-formoterol (SYMBICORT) 80-4.5 MCG/ACT inhaler; Inhale 2 puffs into the lungs in the morning and at bedtime.  Dispense: 10.2 each; Refill: 2

## 2020-08-26 ENCOUNTER — Other Ambulatory Visit: Payer: Self-pay

## 2020-08-26 ENCOUNTER — Encounter: Payer: Self-pay | Admitting: Family Medicine

## 2020-08-26 ENCOUNTER — Ambulatory Visit: Payer: Commercial Managed Care - PPO | Admitting: Family Medicine

## 2020-08-26 VITALS — BP 112/80 | HR 96 | Temp 98.3°F | Resp 16 | Ht 62.0 in | Wt 201.5 lb

## 2020-08-26 DIAGNOSIS — Z23 Encounter for immunization: Secondary | ICD-10-CM

## 2020-08-26 DIAGNOSIS — E559 Vitamin D deficiency, unspecified: Secondary | ICD-10-CM

## 2020-08-26 DIAGNOSIS — J45991 Cough variant asthma: Secondary | ICD-10-CM

## 2020-08-26 DIAGNOSIS — J4530 Mild persistent asthma, uncomplicated: Secondary | ICD-10-CM | POA: Diagnosis not present

## 2020-08-26 DIAGNOSIS — F419 Anxiety disorder, unspecified: Secondary | ICD-10-CM

## 2020-08-26 DIAGNOSIS — F320 Major depressive disorder, single episode, mild: Secondary | ICD-10-CM

## 2020-08-26 DIAGNOSIS — I10 Essential (primary) hypertension: Secondary | ICD-10-CM | POA: Diagnosis not present

## 2020-08-26 DIAGNOSIS — R7303 Prediabetes: Secondary | ICD-10-CM

## 2020-08-26 DIAGNOSIS — E538 Deficiency of other specified B group vitamins: Secondary | ICD-10-CM

## 2020-08-26 DIAGNOSIS — Z13220 Encounter for screening for lipoid disorders: Secondary | ICD-10-CM

## 2020-08-26 DIAGNOSIS — G43109 Migraine with aura, not intractable, without status migrainosus: Secondary | ICD-10-CM

## 2020-08-26 MED ORDER — VENLAFAXINE HCL ER 150 MG PO CP24: 150.0000 mg | ORAL_CAPSULE | Freq: Every day | ORAL | 1 refills | Status: DC

## 2020-08-26 MED ORDER — NADOLOL 20 MG PO TABS: 20.0000 mg | ORAL_TABLET | Freq: Every day | ORAL | 1 refills | Status: DC

## 2020-08-26 MED ORDER — BUDESONIDE-FORMOTEROL FUMARATE 80-4.5 MCG/ACT IN AERO: 2.0000 | INHALATION_SPRAY | Freq: Two times a day (BID) | RESPIRATORY_TRACT | 2 refills | Status: DC

## 2020-08-26 MED ORDER — ONDANSETRON HCL 4 MG PO TABS: 4.0000 mg | ORAL_TABLET | Freq: Three times a day (TID) | ORAL | 0 refills | Status: DC | PRN

## 2020-09-03 ENCOUNTER — Other Ambulatory Visit: Payer: Self-pay | Admitting: Family Medicine

## 2020-09-03 DIAGNOSIS — G43011 Migraine without aura, intractable, with status migrainosus: Secondary | ICD-10-CM

## 2020-09-05 LAB — CBC WITH DIFFERENTIAL/PLATELET
Basophils Absolute: 0.1 10*3/uL (ref 0.0–0.2)
Basos: 1 %
EOS (ABSOLUTE): 0.2 10*3/uL (ref 0.0–0.4)
Eos: 2 %
Hematocrit: 41.5 % (ref 34.0–46.6)
Hemoglobin: 14 g/dL (ref 11.1–15.9)
Immature Grans (Abs): 0 10*3/uL (ref 0.0–0.1)
Immature Granulocytes: 0 %
Lymphocytes Absolute: 1.9 10*3/uL (ref 0.7–3.1)
Lymphs: 23 %
MCH: 28.6 pg (ref 26.6–33.0)
MCHC: 33.7 g/dL (ref 31.5–35.7)
MCV: 85 fL (ref 79–97)
Monocytes Absolute: 0.6 10*3/uL (ref 0.1–0.9)
Monocytes: 8 %
Neutrophils Absolute: 5.4 10*3/uL (ref 1.4–7.0)
Neutrophils: 66 %
Platelets: 375 10*3/uL (ref 150–450)
RBC: 4.9 x10E6/uL (ref 3.77–5.28)
RDW: 12.4 % (ref 11.7–15.4)
WBC: 8.1 10*3/uL (ref 3.4–10.8)

## 2020-09-05 LAB — COMPREHENSIVE METABOLIC PANEL
ALT: 12 IU/L (ref 0–32)
AST: 18 IU/L (ref 0–40)
Albumin/Globulin Ratio: 1.2 (ref 1.2–2.2)
Albumin: 4.1 g/dL (ref 3.8–4.9)
Alkaline Phosphatase: 107 IU/L (ref 44–121)
BUN/Creatinine Ratio: 10 (ref 9–23)
BUN: 9 mg/dL (ref 6–24)
Bilirubin Total: 0.4 mg/dL (ref 0.0–1.2)
CO2: 23 mmol/L (ref 20–29)
Calcium: 9.8 mg/dL (ref 8.7–10.2)
Chloride: 103 mmol/L (ref 96–106)
Creatinine, Ser: 0.86 mg/dL (ref 0.57–1.00)
GFR calc Af Amer: 87 mL/min/{1.73_m2} (ref >59)
GFR calc non Af Amer: 76 mL/min/{1.73_m2} (ref >59)
Globulin, Total: 3.4 g/dL (ref 1.5–4.5)
Glucose: 86 mg/dL (ref 65–99)
Potassium: 4.8 mmol/L (ref 3.5–5.2)
Sodium: 142 mmol/L (ref 134–144)
Total Protein: 7.5 g/dL (ref 6.0–8.5)

## 2020-09-05 LAB — LIPID PANEL
Chol/HDL Ratio: 3.2 ratio (ref 0.0–4.4)
Cholesterol, Total: 183 mg/dL (ref 100–199)
HDL: 57 mg/dL (ref >39)
LDL Chol Calc (NIH): 106 mg/dL — ABNORMAL HIGH (ref 0–99)
Triglycerides: 115 mg/dL (ref 0–149)
VLDL Cholesterol Cal: 20 mg/dL (ref 5–40)

## 2020-09-05 LAB — HEMOGLOBIN A1C
Est. average glucose Bld gHb Est-mCnc: 128 mg/dL
Hgb A1c MFr Bld: 6.1 % — ABNORMAL HIGH (ref 4.8–5.6)

## 2020-09-05 LAB — VITAMIN D 25 HYDROXY (VIT D DEFICIENCY, FRACTURES): Vit D, 25-Hydroxy: 38.1 ng/mL (ref 30.0–100.0)

## 2020-09-05 LAB — VITAMIN B12: Vitamin B-12: 685 pg/mL (ref 232–1245)

## 2020-11-17 ENCOUNTER — Other Ambulatory Visit: Payer: Self-pay | Admitting: Dermatology

## 2020-11-18 ENCOUNTER — Other Ambulatory Visit: Payer: Self-pay | Admitting: Family Medicine

## 2020-11-18 DIAGNOSIS — G44031 Episodic paroxysmal hemicrania, intractable: Secondary | ICD-10-CM

## 2021-02-15 ENCOUNTER — Other Ambulatory Visit: Payer: Self-pay | Admitting: Family Medicine

## 2021-02-15 DIAGNOSIS — F419 Anxiety disorder, unspecified: Secondary | ICD-10-CM

## 2021-02-15 DIAGNOSIS — F320 Major depressive disorder, single episode, mild: Secondary | ICD-10-CM

## 2021-02-15 DIAGNOSIS — G43109 Migraine with aura, not intractable, without status migrainosus: Secondary | ICD-10-CM

## 2021-02-15 NOTE — Telephone Encounter (Signed)
Requested Prescriptions  Pending Prescriptions Disp Refills  . venlafaxine XR (EFFEXOR-XR) 150 MG 24 hr capsule [Pharmacy Med Name: VENLAFAXINE HCL ER 150 MG CAP] 90 capsule 1    Sig: TAKE 1 CAPSULE (150 MG TOTAL) BY MOUTH DAILY WITH BREAKFAST.     Psychiatry: Antidepressants - SNRI - desvenlafaxine & venlafaxine Failed - 02/15/2021  1:30 AM      Failed - LDL in normal range and within 360 days    LDL Chol Calc (NIH)  Date Value Ref Range Status  09/04/2020 106 (H) 0 - 99 mg/dL Final         Passed - Total Cholesterol in normal range and within 360 days    Cholesterol, Total  Date Value Ref Range Status  09/04/2020 183 100 - 199 mg/dL Final         Passed - Triglycerides in normal range and within 360 days    Triglycerides  Date Value Ref Range Status  09/04/2020 115 0 - 149 mg/dL Final         Passed - Last BP in normal range    BP Readings from Last 1 Encounters:  08/26/20 112/80         Passed - Valid encounter within last 6 months    Recent Outpatient Visits          5 months ago Flu vaccine need   [facility] Steele Sizer, MD   1 year ago Essential hypertension   [facility] Steele Sizer, MD   1 year ago Mild major depression [facility])   [facility] [place], Drue Stager, MD   1 year ago Intractable migraine without aura and with status migrainosus   [facility], Drue Stager, MD   1 year ago Migraine without aura and without status migrainosus, not intractable   [facility] Steele Sizer, MD      Future Appointments            In 2 days Steele Sizer, MD [facility]

## 2021-02-17 ENCOUNTER — Ambulatory Visit: Payer: Commercial Managed Care - PPO | Admitting: Family Medicine

## 2021-03-15 NOTE — Progress Notes (Signed)
Name: Tammy Swanson   MRN: [id number]    DOB: Nov 23, 1966   Date:03/16/2021       Progress Note  Subjective  Chief Complaint  Follow Up  HPI  Depression Mild Recurrent: she was on wellbutrin and zoloft and was doing well, but because of migraines she was started on Effexor , so we stopped zoloft and increased dose of Effexor, she was doing well on regiment, but is very worried about her mother that has bene in the hospital for a while and is waiting for rehab.   Headaches: she has a history of migraines as an young adult, she was doing well, however last Fall she noticed  symptoms returned. We tried Topamax and it was working but it caused brain fog and because of severe symptoms  she was referred to Dr. Melrose Nakayama, she is now off Topamax and taking Nortriptyline 50 mg every night , she is on  Effexor and Nadolol. Migraine was   throbbing, severe, usually right side of head. She stopped Imitrex since it is not as efficacious and also does not like side effects. She states since last visit Amgality not cover by insurance or Roselyn Meier - if not covered we will try Nurtec -currently episodes still severe but less frequent , maybe twice a month, it can las 2-3 days per episodes. Explained sometimes only approved by neurologist , however she found out she was having aura ( flashing lights, and more recently some water sensation on her head.  HTN: bp is at goal, no side effects of medication, no chest pain or palpitation Denies dizziness . Nadolol has helped with migraine also , no side effects of medication . BP is at goal   Pre-diabetes: no polyphagia, polydipsia or polyuria. Last A1C was 6.1 %   Vitamin D and B12 deficiency: doing well on otc supplementation , reviewed last labs   GERD: she states he is not longer on Ozempic and has gained weight, having some early satiety and bloating but now only occasionally, discussed GERD appropriate diet She has noticed some early satiety, we will check for h.  Pylori and refer to GI  Asthma mild intermittent: she states present in the Spring and Fall, she goes back on Symbicort every Fall and every Spring, she states for the past 6 weeks symptoms have returned, usually a dry cough, she states now that she has been using Symbicort in am's and albuterol every night, explained if she has been having daily symptoms she must use Symbicort twice daily . She also has intermittent wheezing, but no SOB  Tinnitus  she has noticed some tinnitus, described as hearing like water is inside her ear, intermittent, worse at night when trying to sleep It happens on both sides. Discussed referred to ENT but can also discuss it Dr. Melrose Nakayama since it may also be aura.   Patient Active Problem List   Diagnosis Date Noted  . Transient confusion 05/25/2020  . Nausea 03/06/2020  . Phonophobia 03/06/2020  . Photophobia 03/06/2020  . Headache disorder 10/22/2019  . Sleep disorder 10/22/2019  . History of carpal tunnel surgery of right wrist 12/06/2016  . Prediabetes 10/15/2016  . History of liver failure 08/24/2016  . Migraine without aura and without status migrainosus, not intractable 09/10/2015  . Eczema 09/10/2015  . Bad memory 06/01/2015  . Menopause 06/01/2015  . Cough variant asthma 06/01/2015  . Obesity (BMI 30-39.9) 06/01/2015  . Nodule, subcutaneous 06/01/2015  . Lesion of ulnar nerve 06/01/2015  . Vitiligo  06/01/2015  . Benign hypertension 09/03/2010    Past Surgical History:  Procedure Laterality Date  . ABDOMINAL EXPLORATION SURGERY     x3  . ABDOMINAL HYSTERECTOMY  2013  . ABDOMINAL HYSTERECTOMY  2014  . CARPAL TUNNEL RELEASE Right 12/06/2016   Procedure: CARPAL TUNNEL RELEASE;  Surgeon: Hessie Knows, MD;  Location: [facility] ORS;  Service: Orthopedics;  Laterality: Right;  . CHOLECYSTECTOMY    . FOOT SURGERY    . FOOT SURGERY Left 2007/2009   Dr. Elvina Mattes  . KNEE ARTHROSCOPY Left 1985  . KNEE SURGERY Left   . MANDIBLE FRACTURE SURGERY    . NASAL  SINUS SURGERY    . NASAL SINUS SURGERY  2012  . RECONSTRUCTION MANDIBLE / MAXILLA  1992  . TONSILLECTOMY    . TONSILLECTOMY  1981  . TRIGGER FINGER RELEASE      Family History  Problem Relation Age of Onset  . Hypertension Mother   . Cancer Mother        Breast   . HIV/AIDS Mother   . Breast cancer Mother   . Cancer Father   . Hypertension Sister   . Cancer Maternal Aunt        3 sisters with Breast Cancer  . Breast cancer Maternal Aunt   . Cancer Paternal Aunt   . Cancer Paternal Uncle   . Hypertension Maternal Grandmother   . Hypertension Maternal Grandfather   . Breast cancer Maternal Aunt     Social History   Tobacco Use  . Smoking status: Never Smoker  . Smokeless tobacco: Never Used  Substance Use Topics  . Alcohol use: Yes    Comment: occasionally     Current Outpatient Medications:  .  Adapalene-Benzoyl Peroxide 0.3-2.5 % GEL, Epiduo Forte 0.3 %-2.5 % topical gel with pump, Disp: , Rfl:  .  budesonide-formoterol (SYMBICORT) 80-4.5 MCG/ACT inhaler, Inhale 2 puffs into the lungs in the morning and at bedtime., Disp: 10.2 each, Rfl: 2 .  cholecalciferol (VITAMIN D) 1000 units tablet, Take 1,000 Units by mouth daily., Disp: , Rfl:  .  Cyanocobalamin (B-12) 1000 MCG SUBL, Place 1 tablet under the tongue daily., Disp: , Rfl:  .  estradiol (VIVELLE-DOT) 0.05 MG/24HR patch, APPLY 1 PATCH TWICE A WEEK, Disp: , Rfl: 11 .  losartan-hydrochlorothiazide (HYZAAR) 50-12.5 MG tablet, Take 1 tablet by mouth daily., Disp: 90 tablet, Rfl: 0 .  nadolol (CORGARD) 20 MG tablet, Take 1 tablet (20 mg total) by mouth daily. In place of Atenolol, Disp: 90 tablet, Rfl: 1 .  nortriptyline (PAMELOR) 50 MG capsule, Take 50 mg by mouth at bedtime., Disp: , Rfl:  .  ondansetron (ZOFRAN) 4 MG tablet, Take 1 tablet (4 mg total) by mouth every 8 (eight) hours as needed for nausea or vomiting., Disp: 10 tablet, Rfl: 0 .  tacrolimus (PROTOPIC) 0.1 % ointment, Apply 1 application topically daily.,  Disp: 300 g, Rfl: 1 .  telmisartan-hydrochlorothiazide (MICARDIS HCT) 40-12.5 MG tablet, Take 1 tablet by mouth daily., Disp: , Rfl:  .  venlafaxine XR (EFFEXOR-XR) 150 MG 24 hr capsule, TAKE 1 CAPSULE (150 MG TOTAL) BY MOUTH DAILY WITH BREAKFAST., Disp: 90 capsule, Rfl: 1 .  albuterol (VENTOLIN HFA) 108 (90 Base) MCG/ACT inhaler, Inhale 2 puffs into the lungs every 6 (six) hours as needed for wheezing or shortness of breath., Disp: 18 g, Rfl: 0 .  EMGALITY 120 MG/ML SOAJ, Inject 1 each into the skin every 30 (thirty) days. (Patient not taking: Reported on 03/16/2021), Disp: ,  Rfl:  .  UBRELVY 100 MG TABS, TAKE 1 TABLET BY MOUTH DAILY AS NEEDED. TO ALTERNATE WITH IMITREX WHEN NEEDED (Patient not taking: Reported on 03/16/2021), Disp: 10 tablet, Rfl: 0  Allergies  Allergen Reactions  . Latex Anaphylaxis  . Belviq [Lorcaserin Hcl]     Dry mouth, palpitation  . Imitrex [Sumatriptan]     Intolerance   . Vicodin [Hydrocodone-Acetaminophen] Hives    I personally reviewed active problem list, medication list, allergies, family history, social history, health maintenance with the patient/caregiver today.   ROS  Constitutional: Negative for fever or weight change.  Respiratory: Negative for cough and shortness of breath.   Cardiovascular: Negative for chest pain or palpitations.  Gastrointestinal: Negative for abdominal pain/feels bloated , no bowel changes.  Musculoskeletal: Negative for gait problem or joint swelling.  Skin: Negative for rash.  Neurological: Negative for dizziness , positive for headache.  No other specific complaints in a complete review of systems (except as listed in HPI above).  Objective  Vitals:   03/16/21 1415  BP: 120/82  Pulse: 83  Resp: 16  Temp: 97.8 F (36.6 C)  TempSrc: Oral  SpO2: 99%  Weight: 201 lb (91.2 kg)  Height: 5\' 2"  (1.575 m)    Body mass index is 36.76 kg/m.  Physical Exam  Constitutional: Patient appears well-developed and  well-nourished. Obese No distress.  HEENT: head atraumatic, normocephalic, pupils equal and reactive to light, neck supple Cardiovascular: Normal rate, regular rhythm and normal heart sounds.  No murmur heard. No BLE edema. Pulmonary/Chest: Effort normal and breath sounds normal. No respiratory distress. Abdominal: Soft.  There is no tenderness. Psychiatric: Patient has a normal mood and affect. behavior is normal. Judgment and thought content normal.  PHQ2/9: Depression screen [facility] 2/9 03/16/2021 08/26/2020 12/25/2019 09/24/2019 09/13/2019  Decreased Interest 0 0 0 1 1  Down, Depressed, Hopeless 1 0 0 0 1  PHQ - 2 Score 1 0 0 1 2  Altered sleeping 1 0 0 0 0  Tired, decreased energy 0 2 1 1 2   Change in appetite 3 1 0 0 0  Feeling bad or failure about yourself  0 0 0 0 0  Trouble concentrating 3 3 1 1 1   Moving slowly or fidgety/restless 0 0 1 0 0  Suicidal thoughts 0 0 0 0 0  PHQ-9 Score 8 6 3 3 5   Difficult doing work/chores - Somewhat difficult Not difficult at all Not difficult at all Somewhat difficult  Some recent data might be hidden    phq 9 is positive   Fall Risk: Fall Risk  03/16/2021 08/26/2020 12/25/2019 09/24/2019 08/23/2019  Falls in the past year? 0 0 0 0 0  Number falls in past yr: 0 0 0 0 0  Injury with Fall? 0 0 0 0 0  Comment - - - - -     Functional Status Survey: Is the patient deaf or have difficulty hearing?: No Does the patient have difficulty seeing, even when wearing glasses/contacts?: No Does the patient have difficulty concentrating, remembering, or making decisions?: No Does the patient have difficulty walking or climbing stairs?: No Does the patient have difficulty dressing or bathing?: No Does the patient have difficulty doing errands alone such as visiting a doctor's office or shopping?: No    Assessment & Plan   1. Essential hypertension  - losartan-hydrochlorothiazide (HYZAAR) 50-12.5 MG tablet; Take 1 tablet by mouth daily.  Dispense: 90  tablet; Refill: 1 - nadolol (CORGARD) 20 MG tablet;  Take 1 tablet (20 mg total) by mouth daily. In place of Atenolol  Dispense: 90 tablet; Refill: 1  2. Mild persistent asthma without complication  - budesonide-formoterol (SYMBICORT) 80-4.5 MCG/ACT inhaler; Inhale 2 puffs into the lungs in the morning and at bedtime.  Dispense: 10.2 each; Refill: 2  3. B12 deficiency   4. Vitamin D deficiency   5. Prediabetes   6. Tinnitus of both ears   7. Migraine with aura and without status migrainosus, not intractable  - nadolol (CORGARD) 20 MG tablet; Take 1 tablet (20 mg total) by mouth daily. In place of Atenolol  Dispense: 90 tablet; Refill: 1  8. Bloating  - H. pylori breath test - Ambulatory referral to Gastroenterology  9. Dyspepsia  - H. pylori breath test - Ambulatory referral to Gastroenterology  10. Colon cancer screening  - Ambulatory referral to Gastroenterology  11. Migraine with aura and with status migrainosus, not intractable  - Ubrogepant (UBRELVY) 100 MG TABS; Take 1 tablet by mouth daily as needed. To alternate with imitrex when needed  Dispense: 8 tablet; Refill: 0  12. Cough variant asthma  - budesonide-formoterol (SYMBICORT) 80-4.5 MCG/ACT inhaler; Inhale 2 puffs into the lungs in the morning and at bedtime.  Dispense: 10.2 each; Refill: 2

## 2021-03-16 ENCOUNTER — Encounter: Payer: Self-pay | Admitting: *Deleted

## 2021-03-16 ENCOUNTER — Other Ambulatory Visit: Payer: Self-pay

## 2021-03-16 ENCOUNTER — Encounter: Payer: Self-pay | Admitting: Family Medicine

## 2021-03-16 ENCOUNTER — Ambulatory Visit: Payer: Commercial Managed Care - PPO | Admitting: Family Medicine

## 2021-03-16 VITALS — BP 120/82 | HR 83 | Temp 97.8°F | Resp 16 | Ht 62.0 in | Wt 201.0 lb

## 2021-03-16 DIAGNOSIS — Z1211 Encounter for screening for malignant neoplasm of colon: Secondary | ICD-10-CM

## 2021-03-16 DIAGNOSIS — E559 Vitamin D deficiency, unspecified: Secondary | ICD-10-CM

## 2021-03-16 DIAGNOSIS — H9313 Tinnitus, bilateral: Secondary | ICD-10-CM

## 2021-03-16 DIAGNOSIS — E538 Deficiency of other specified B group vitamins: Secondary | ICD-10-CM

## 2021-03-16 DIAGNOSIS — R7303 Prediabetes: Secondary | ICD-10-CM

## 2021-03-16 DIAGNOSIS — J4530 Mild persistent asthma, uncomplicated: Secondary | ICD-10-CM

## 2021-03-16 DIAGNOSIS — G43109 Migraine with aura, not intractable, without status migrainosus: Secondary | ICD-10-CM

## 2021-03-16 DIAGNOSIS — I10 Essential (primary) hypertension: Secondary | ICD-10-CM

## 2021-03-16 DIAGNOSIS — J45991 Cough variant asthma: Secondary | ICD-10-CM

## 2021-03-16 DIAGNOSIS — R140 Abdominal distension (gaseous): Secondary | ICD-10-CM

## 2021-03-16 DIAGNOSIS — R1013 Epigastric pain: Secondary | ICD-10-CM

## 2021-03-16 DIAGNOSIS — G43101 Migraine with aura, not intractable, with status migrainosus: Secondary | ICD-10-CM

## 2021-03-16 MED ORDER — BUDESONIDE-FORMOTEROL FUMARATE 80-4.5 MCG/ACT IN AERO: 2.0000 | INHALATION_SPRAY | Freq: Two times a day (BID) | RESPIRATORY_TRACT | 2 refills | Status: DC

## 2021-03-16 MED ORDER — NADOLOL 20 MG PO TABS: 20.0000 mg | ORAL_TABLET | Freq: Every day | ORAL | 1 refills | Status: DC

## 2021-03-16 MED ORDER — UBRELVY 100 MG PO TABS: 1.0000 | ORAL_TABLET | Freq: Every day | ORAL | 0 refills | Status: DC | PRN

## 2021-03-16 MED ORDER — LOSARTAN POTASSIUM-HCTZ 50-12.5 MG PO TABS: 1.0000 | ORAL_TABLET | Freq: Every day | ORAL | 1 refills | Status: DC

## 2021-03-17 LAB — H. PYLORI BREATH TEST: H. pylori Breath Test: NOT DETECTED

## 2021-04-01 ENCOUNTER — Encounter: Payer: Self-pay | Admitting: *Deleted

## 2021-04-26 ENCOUNTER — Ambulatory Visit: Payer: Managed Care, Other (non HMO) | Admitting: Gastroenterology

## 2021-05-09 ENCOUNTER — Other Ambulatory Visit: Payer: Self-pay | Admitting: Family Medicine

## 2021-05-09 DIAGNOSIS — J4530 Mild persistent asthma, uncomplicated: Secondary | ICD-10-CM

## 2021-05-09 DIAGNOSIS — J45991 Cough variant asthma: Secondary | ICD-10-CM

## 2021-05-24 ENCOUNTER — Other Ambulatory Visit: Payer: Self-pay

## 2021-05-24 ENCOUNTER — Encounter: Payer: Self-pay | Admitting: Gastroenterology

## 2021-05-24 ENCOUNTER — Ambulatory Visit: Payer: Commercial Managed Care - PPO | Admitting: Gastroenterology

## 2021-05-24 VITALS — BP 166/110 | HR 69 | Temp 96.9°F | Ht 62.0 in | Wt 201.4 lb

## 2021-05-24 DIAGNOSIS — Z1211 Encounter for screening for malignant neoplasm of colon: Secondary | ICD-10-CM

## 2021-05-24 DIAGNOSIS — R140 Abdominal distension (gaseous): Secondary | ICD-10-CM

## 2021-05-24 DIAGNOSIS — R1013 Epigastric pain: Secondary | ICD-10-CM

## 2021-05-24 NOTE — Progress Notes (Signed)
Gastroenterology Consultation  Referring Provider:     Steele Sizer, MD Primary Care Physician:  Steele Sizer, MD Primary Gastroenterologist:  Dr. Allen Norris     Reason for Consultation:     Bloating        HPI:   Tammy Swanson is a 55 y.o. y/o female referred for consultation & management of bloating by Dr. Ancil Boozer, Drue Stager, MD.  This woman comes today with a history of bloating.  The patient states that she has switched over to Lactaid milk but still eats cheese.  She reports that she has bloating that gets worse throughout the day.  The patient reports that she has normal bowel movements without any constipation. The patient had a negative cologuard in 2015. She also reports daily heartburn for the last 2 years.  She thinks it may be related to her nortriptyline which she takes for migraine headaches.  The patient denies any weight loss but states that she has been gaining weight.  There is no report of any black stools or bloody stools.  She does report that she has had multiple abdominal surgeries with multiple complications in the past.  Past Medical History:  Diagnosis Date  . Anxiety   . Arthritis    KNEE-LEFT  . Asthma    WELL-CONTROLLED  . Complication of anesthesia    ASTHMA ATTACK DURING SURGERY X 3  . Headache    H/O MIGRAINES  . Hypertension   . Liver failure ([place]) 1994   PT HAD GALLSTONES-PT ENDED UP WITH PANCREATITIS AND THEN ENDED UP IN LIVER FAILURE  . Obesity   . Pain in joint involving upper arm   . Pancreatitis   . Paresthesia of thumb of right hand   . PONV (postoperative nausea and vomiting)   . Vitamin D deficiency   . Vitiligo     Past Surgical History:  Procedure Laterality Date  . ABDOMINAL EXPLORATION SURGERY     x3  . ABDOMINAL HYSTERECTOMY  2013  . ABDOMINAL HYSTERECTOMY  2014  . CARPAL TUNNEL RELEASE Right 12/06/2016   Procedure: CARPAL TUNNEL RELEASE;  Surgeon: Hessie Knows, MD;  Location: [facility] ORS;  Service: Orthopedics;   Laterality: Right;  . CHOLECYSTECTOMY    . FOOT SURGERY    . FOOT SURGERY Left 2007/2009   Dr. Elvina Mattes  . KNEE ARTHROSCOPY Left 1985  . KNEE SURGERY Left   . MANDIBLE FRACTURE SURGERY    . NASAL SINUS SURGERY    . NASAL SINUS SURGERY  2012  . RECONSTRUCTION MANDIBLE / MAXILLA  1992  . TONSILLECTOMY    . TONSILLECTOMY  1981  . TRIGGER FINGER RELEASE      Prior to Admission medications   Medication Sig Start Date End Date Taking? Authorizing Provider  Adapalene-Benzoyl Peroxide 0.3-2.5 % GEL Epiduo Forte 0.3 %-2.5 % topical gel with pump    [provider]  albuterol (VENTOLIN HFA) 108 (90 Base) MCG/ACT inhaler Inhale 2 puffs into the lungs every 6 (six) hours as needed for wheezing or shortness of breath. 12/25/19 03/24/20  Steele Sizer, MD  budesonide-formoterol (SYMBICORT) 80-4.5 MCG/ACT inhaler Inhale 2 puffs into the lungs in the morning and at bedtime. 03/16/21   Steele Sizer, MD  cholecalciferol (VITAMIN D) 1000 units tablet Take 1,000 Units by mouth daily.    [provider]  Cyanocobalamin (B-12) 1000 MCG SUBL Place 1 tablet under the tongue daily.    [provider]  estradiol (VIVELLE-DOT) 0.05 MG/24HR patch APPLY 1 PATCH TWICE  A WEEK 08/03/18   Ward, Honor Loh, MD  losartan-hydrochlorothiazide (HYZAAR) 50-12.5 MG tablet Take 1 tablet by mouth daily. 03/16/21   Steele Sizer, MD  nadolol (CORGARD) 20 MG tablet Take 1 tablet (20 mg total) by mouth daily. In place of Atenolol 03/16/21   Steele Sizer, MD  nortriptyline (PAMELOR) 50 MG capsule Take 50 mg by mouth at bedtime.    Anabel Bene, MD  ondansetron (ZOFRAN) 4 MG tablet Take 1 tablet (4 mg total) by mouth every 8 (eight) hours as needed for nausea or vomiting. 08/26/20   Steele Sizer, MD  tacrolimus (PROTOPIC) 0.1 % ointment Apply 1 application topically daily. 09/10/15   Sowles, Drue Stager, MD  Ubrogepant (UBRELVY) 100 MG TABS Take 1 tablet by mouth daily as needed. To alternate with imitrex  when needed 03/16/21   Steele Sizer, MD  venlafaxine XR (EFFEXOR-XR) 150 MG 24 hr capsule TAKE 1 CAPSULE (150 MG TOTAL) BY MOUTH DAILY WITH BREAKFAST. 02/15/21   Steele Sizer, MD    Family History  Problem Relation Age of Onset  . Hypertension Mother   . Cancer Mother        Breast   . HIV/AIDS Mother   . Breast cancer Mother   . Cancer Father   . Hypertension Sister   . Cancer Maternal Aunt        3 sisters with Breast Cancer  . Breast cancer Maternal Aunt   . Cancer Paternal Aunt   . Cancer Paternal Uncle   . Hypertension Maternal Grandmother   . Hypertension Maternal Grandfather   . Breast cancer Maternal Aunt      Social History   Tobacco Use  . Smoking status: Never  . Smokeless tobacco: Never  Vaping Use  . Vaping Use: Never used  Substance Use Topics  . Alcohol use: Yes    Comment: occasionally  . Drug use: No    Allergies as of 05/24/2021 - Review Complete 03/16/2021  Allergen Reaction Noted  . Latex Anaphylaxis 03/31/2015  . Belviq [lorcaserin hcl]  08/20/2018  . Imitrex [sumatriptan]  09/24/2019  . Vicodin [hydrocodone-acetaminophen] Hives 08/24/2016    Review of Systems:    All systems reviewed and negative except where noted in HPI.   Physical Exam:  There were no vitals taken for this visit. No LMP recorded. Patient has had a hysterectomy. General:   Alert,  Well-developed, well-nourished, pleasant and cooperative in NAD Head:  Normocephalic and atraumatic. Eyes:  Sclera clear, no icterus.   Conjunctiva pink. Ears:  Normal auditory acuity. Neck:  Supple; no masses or thyromegaly. Lungs:  Respirations even and unlabored.  Clear throughout to auscultation.   No wheezes, crackles, or rhonchi. No acute distress. Heart:  Regular rate and rhythm; no murmurs, clicks, rubs, or gallops. Abdomen:  Normal bowel sounds.  No bruits.  Soft, non-tender and non-distended without masses, hepatosplenomegaly or hernias noted.  No guarding or rebound tenderness.   Negative Carnett sign.   Rectal:  Deferred.  Pulses:  Normal pulses noted. Extremities:  No clubbing or edema.  No cyanosis. Neurologic:  Alert and oriented x3;  grossly normal neurologically. Skin:  Intact without significant lesions or rashes.  No jaundice. Lymph Nodes:  No significant cervical adenopathy. Psych:  Alert and cooperative. Normal mood and affect.  Imaging Studies: No results found.  Assessment and Plan:   Tammy Swanson is a 55 y.o. y/o female who comes in in need of a screening colonoscopy.  The patient also has abdominal bloating  and chronic reflux.  The patient will be set up for an EGD and colonoscopy.  The patient has been explained the plan and agrees with it.    Lucilla Lame, MD. Marval Regal    Note: This dictation was prepared with Dragon dictation along with smaller phrase technology. Any transcriptional errors that result from this process are unintentional.

## 2021-06-17 NOTE — Progress Notes (Signed)
Name: Tammy Swanson   MRN: [id number]    DOB: 1966/04/29   Date:06/21/2021       Progress Note  Subjective  Chief Complaint  Follow Up  HPI  Depression Mild Recurrent: she was on wellbutrin and zoloft and was doing well, but because of migraines she was started on Effexor , so we stopped zoloft and increased dose of Effexor 150 mg, she is still grieving the loss of her mother ( died 05/03/2023) she will reach out to hospice for grieve counseling    Headaches: she has a history of migraines as an young adult, she was doing well, however last Fall she noticed  symptoms returned. We tried Topamax and it was working but it caused brain fog and because of severe symptoms  she was referred to Dr. Melrose Nakayama, she is now off Topamax and taking Nortriptyline 50 mg every night , she is on  Effexor and Nadolol. Migraine is usually throbbing, severe, usually right side of head. She stopped Imitrex since it is not as efficacious and also does not like side effects. She states since last visit Emgality not cover by insurance or [place]. She states the current episode has bene constant on right frontal area for the past week, we gave her a sample of Emgality today and advised follow up with Dr. Melrose Nakayama .   HTN: bp is at goal, no side effects of medication, no chest pain or palpitation Denies dizziness . Nadolol has helped with migraine also, bp is at goal   Pre-diabetes: no polyphagia, polydipsia or polyuria. Last A1C was 6.1 %, we will recheck it next visit   Vitamin D and B12 deficiency: doing well on otc supplementation , reviewed last labs   GERD: she is seeing GI - Dr. Allen Norris ,and will have EGD and colonoscopy in August   Asthma mild persistent  she states present in the Spring and Fall, howeer had COVID-19 end of May and is still having wheezing at night, using Symbicort to control symptoms. She had three vaccines. She denies any current cough   Tinnitus  she has noticed some tinnitus, described as  hearing like water is inside her ear, intermittent, worse at night when trying to sleep It happens on both sides. Discussed referred to ENT but can also discuss it Dr. Melrose Nakayama since it may also be aura.   Patient Active Problem List   Diagnosis Date Noted   Transient confusion 05/25/2020   Phonophobia 03/06/2020   Photophobia 03/06/2020   Headache disorder 10/22/2019   Sleep disorder 10/22/2019   History of carpal tunnel surgery of right wrist 12/06/2016   Prediabetes 10/15/2016   History of liver failure 08/24/2016   Migraine without aura and without status migrainosus, not intractable 09/10/2015   Eczema 09/10/2015   Bad memory 06/01/2015   Menopause 06/01/2015   Cough variant asthma 06/01/2015   Obesity (BMI 30-39.9) 06/01/2015   Nodule, subcutaneous 06/01/2015   Lesion of ulnar nerve 06/01/2015   Vitiligo 06/01/2015   Benign hypertension 09/03/2010    Past Surgical History:  Procedure Laterality Date   ABDOMINAL EXPLORATION SURGERY     x3   ABDOMINAL HYSTERECTOMY  2013   ABDOMINAL HYSTERECTOMY  2014   CARPAL TUNNEL RELEASE Right 12/06/2016   Procedure: CARPAL TUNNEL RELEASE;  Surgeon: Hessie Knows, MD;  Location: [facility] ORS;  Service: Orthopedics;  Laterality: Right;   CHOLECYSTECTOMY     FOOT SURGERY     FOOT SURGERY Left 2007/2009   Dr. Elvina Mattes  KNEE ARTHROSCOPY Left 1985   KNEE SURGERY Left    MANDIBLE FRACTURE SURGERY     NASAL SINUS SURGERY     NASAL SINUS SURGERY  2012   RECONSTRUCTION MANDIBLE / MAXILLA  1992   TONSILLECTOMY     TONSILLECTOMY  1981   TRIGGER FINGER RELEASE      Family History  Problem Relation Age of Onset   Hypertension Mother    Cancer Mother        Breast, colon   HIV/AIDS Mother    Breast cancer Mother    Cancer Father        lung cancer   Hypertension Sister    Hypertension Maternal Grandmother    Hypertension Maternal Grandfather    Cancer Maternal Aunt        3 sisters with Breast Cancer   Breast cancer Maternal Aunt     Breast cancer Maternal Aunt    Cancer Paternal Aunt    Cancer Paternal Uncle     Social History   Tobacco Use   Smoking status: Never   Smokeless tobacco: Never  Substance Use Topics   Alcohol use: Yes    Comment: occasionally     Current Outpatient Medications:    Adapalene-Benzoyl Peroxide 0.3-2.5 % GEL, Epiduo Forte 0.3 %-2.5 % topical gel with pump, Disp: , Rfl:    budesonide-formoterol (SYMBICORT) 80-4.5 MCG/ACT inhaler, Inhale 2 puffs into the lungs in the morning and at bedtime., Disp: 10.2 each, Rfl: 2   cholecalciferol (VITAMIN D) 1000 units tablet, Take 1,000 Units by mouth daily., Disp: , Rfl:    Crisaborole 2 % OINT, Apply 1 each topically daily., Disp: , Rfl:    Cyanocobalamin (B-12) 1000 MCG SUBL, Place 1 tablet under the tongue daily., Disp: , Rfl:    estradiol (VIVELLE-DOT) 0.05 MG/24HR patch, APPLY 1 PATCH TWICE A WEEK, Disp: , Rfl: 11   losartan-hydrochlorothiazide (HYZAAR) 50-12.5 MG tablet, Take 1 tablet by mouth daily., Disp: 90 tablet, Rfl: 1   nadolol (CORGARD) 20 MG tablet, Take 1 tablet (20 mg total) by mouth daily. In place of Atenolol, Disp: 90 tablet, Rfl: 1   nortriptyline (PAMELOR) 50 MG capsule, Take 50 mg by mouth at bedtime., Disp: , Rfl:    albuterol (VENTOLIN HFA) 108 (90 Base) MCG/ACT inhaler, Inhale 2 puffs into the lungs every 6 (six) hours as needed for wheezing or shortness of breath., Disp: 18 g, Rfl: 0   venlafaxine XR (EFFEXOR-XR) 150 MG 24 hr capsule, Take 1 capsule (150 mg total) by mouth daily with breakfast., Disp: 90 capsule, Rfl: 1  Allergies  Allergen Reactions   Latex Anaphylaxis   Belviq [Lorcaserin Hcl]     Dry mouth, palpitation   Imitrex [Sumatriptan]     Intolerance    Vicodin [Hydrocodone-Acetaminophen] Hives    I personally reviewed active problem list, medication list, allergies, family history, social history, health maintenance with the patient/caregiver today.   ROS  Constitutional: Negative for fever or weight  change.  Respiratory: Negative for cough and shortness of breath., nocturnal wheezing    Cardiovascular: Negative for chest pain or palpitations.  Gastrointestinal: Negative for abdominal pain, no bowel changes.  Musculoskeletal: Negative for gait problem or joint swelling.  Skin: Negative for rash.  Neurological: Negative for dizziness , positive for headache.  No other specific complaints in a complete review of systems (except as listed in HPI above).   Objective  Vitals:   06/21/21 1536  BP: 128/84  Pulse: 87  Resp:  16  Temp: 97.9 F (36.6 C)  SpO2: 97%  Weight: 196 lb (88.9 kg)  Height: 5\' 2"  (1.575 m)    Body mass index is 35.85 kg/m.  Physical Exam  Constitutional: Patient appears well-developed and well-nourished. Obese  No distress.  HEENT: head atraumatic, normocephalic, pupils equal and reactive to light, neck supple Cardiovascular: Normal rate, regular rhythm and normal heart sounds.  No murmur heard. No BLE edema. Pulmonary/Chest: Effort normal and breath sounds normal. No respiratory distress. Abdominal: Soft.  There is no tenderness. Psychiatric: Patient has a normal mood and affect. behavior is normal. Judgment and thought content normal.    PHQ2/9: Depression screen [facility] 2/9 06/21/2021 03/16/2021 08/26/2020 12/25/2019 09/24/2019  Decreased Interest 1 0 0 0 1  Down, Depressed, Hopeless 1 1 0 0 0  PHQ - 2 Score 2 1 0 0 1  Altered sleeping 0 1 0 0 0  Tired, decreased energy 0 0 2 1 1   Change in appetite 0 3 1 0 0  Feeling bad or failure about yourself  2 0 0 0 0  Trouble concentrating 3 3 3 1 1   Moving slowly or fidgety/restless 0 0 0 1 0  Suicidal thoughts 0 0 0 0 0  PHQ-9 Score 7 8 6 3 3   Difficult doing work/chores - - Somewhat difficult Not difficult at all Not difficult at all  Some recent data might be hidden    phq 9 is positive   Fall Risk: Fall Risk  06/21/2021 03/16/2021 08/26/2020 12/25/2019 09/24/2019  Falls in the past year? 0 0 0 0 0  Number  falls in past yr: 0 0 0 0 0  Injury with Fall? 0 0 0 0 0  Comment - - - - -     Functional Status Survey: Is the patient deaf or have difficulty hearing?: No Does the patient have difficulty seeing, even when wearing glasses/contacts?: No Does the patient have difficulty concentrating, remembering, or making decisions?: Yes Does the patient have difficulty walking or climbing stairs?: No Does the patient have difficulty dressing or bathing?: No Does the patient have difficulty doing errands alone such as visiting a doctor's office or shopping?: No   Assessment & Plan  1. Mild major depression ([place])  Also grieving  - venlafaxine XR (EFFEXOR-XR) 150 MG 24 hr capsule; Take 1 capsule (150 mg total) by mouth daily with breakfast.  Dispense: 90 capsule; Refill: 1  2. Prediabetes   3. B12 deficiency   4. Morbid obesity ([place])  Discussed with the patient the risk posed by an increased BMI. Discussed importance of portion control, calorie counting and at least 150 minutes of physical activity weekly. Avoid sweet beverages and drink more water. Eat at least 6 servings of fruit and vegetables daily    5. Essential hypertension  At goal   6. Mild persistent asthma without complication   7. Migraine with aura and without status migrainosus, not intractable  - venlafaxine XR (EFFEXOR-XR) 150 MG 24 hr capsule; Take 1 capsule (150 mg total) by mouth daily with breakfast.  Dispense: 90 capsule; Refill: 1  8. Intractable episodic paroxysmal hemicrania  Keep follow up with Dr. Melrose Nakayama, responded to [place] [facility] [place] but not covered by insurance  9. Need for shingles vaccine  - Varicella-zoster vaccine IM (Shingrix)  10. Vitamin D deficiency  Continue supplementation   11. Anxiety  - venlafaxine XR (EFFEXOR-XR) 150 MG 24 hr capsule; Take 1 capsule (150 mg total) by mouth daily with breakfast.  Dispense: 90 capsule; Refill: 1  12. History of COVID-19  She states having to use  Symbicort more often lately

## 2021-06-21 ENCOUNTER — Other Ambulatory Visit: Payer: Self-pay

## 2021-06-21 ENCOUNTER — Ambulatory Visit: Payer: Commercial Managed Care - PPO | Admitting: Family Medicine

## 2021-06-21 ENCOUNTER — Encounter: Payer: Self-pay | Admitting: Family Medicine

## 2021-06-21 VITALS — BP 128/84 | HR 87 | Temp 97.9°F | Resp 16 | Ht 62.0 in | Wt 196.0 lb

## 2021-06-21 DIAGNOSIS — R7303 Prediabetes: Secondary | ICD-10-CM

## 2021-06-21 DIAGNOSIS — F320 Major depressive disorder, single episode, mild: Secondary | ICD-10-CM

## 2021-06-21 DIAGNOSIS — I10 Essential (primary) hypertension: Secondary | ICD-10-CM

## 2021-06-21 DIAGNOSIS — E538 Deficiency of other specified B group vitamins: Secondary | ICD-10-CM

## 2021-06-21 DIAGNOSIS — F419 Anxiety disorder, unspecified: Secondary | ICD-10-CM

## 2021-06-21 DIAGNOSIS — G43109 Migraine with aura, not intractable, without status migrainosus: Secondary | ICD-10-CM

## 2021-06-21 DIAGNOSIS — J4530 Mild persistent asthma, uncomplicated: Secondary | ICD-10-CM

## 2021-06-21 DIAGNOSIS — G44031 Episodic paroxysmal hemicrania, intractable: Secondary | ICD-10-CM

## 2021-06-21 DIAGNOSIS — E559 Vitamin D deficiency, unspecified: Secondary | ICD-10-CM

## 2021-06-21 DIAGNOSIS — Z8616 Personal history of COVID-19: Secondary | ICD-10-CM

## 2021-06-21 DIAGNOSIS — Z23 Encounter for immunization: Secondary | ICD-10-CM | POA: Diagnosis not present

## 2021-06-21 MED ORDER — VENLAFAXINE HCL ER 150 MG PO CP24: 150.0000 mg | ORAL_CAPSULE | Freq: Every day | ORAL | 1 refills | Status: DC

## 2021-07-07 ENCOUNTER — Encounter: Payer: Self-pay | Admitting: Gastroenterology

## 2021-07-12 ENCOUNTER — Telehealth: Payer: Self-pay | Admitting: Family Medicine

## 2021-07-12 ENCOUNTER — Other Ambulatory Visit: Payer: Self-pay

## 2021-07-12 DIAGNOSIS — J4530 Mild persistent asthma, uncomplicated: Secondary | ICD-10-CM

## 2021-07-12 MED ORDER — CLENPIQ 10-3.5-12 MG-GM -GM/160ML PO SOLN: 320.0000 mL | ORAL | 0 refills | Status: DC

## 2021-07-13 ENCOUNTER — Other Ambulatory Visit: Payer: Self-pay | Admitting: Family Medicine

## 2021-07-13 DIAGNOSIS — J4530 Mild persistent asthma, uncomplicated: Secondary | ICD-10-CM

## 2021-07-14 NOTE — Telephone Encounter (Signed)
Lvm to schedule an appt

## 2021-07-17 NOTE — Anesthesia Preprocedure Evaluation (Addendum)
Anesthesia Evaluation  Patient identified by MRN, date of birth, ID band Patient awake    Reviewed: Allergy & Precautions, NPO status , Patient's Chart, lab work & pertinent test results  History of Anesthesia Complications (+) PONV and history of anesthetic complications  Airway Mallampati: I   Neck ROM: Full    Dental   Missing few molars:   Pulmonary asthma ,    Pulmonary exam normal breath sounds clear to auscultation       Cardiovascular hypertension, + DVT (liver 1994, no longer on anticoagulation)  Normal cardiovascular exam Rhythm:Regular Rate:Normal     Neuro/Psych  Headaches, PSYCHIATRIC DISORDERS Anxiety Depression Vertigo     GI/Hepatic negative GI ROS,   Endo/Other  Prediabetes   Renal/GU negative Renal ROS     Musculoskeletal  (+) Arthritis ,   Abdominal   Peds  Hematology negative hematology ROS (+)   Anesthesia Other Findings   Reproductive/Obstetrics                            Anesthesia Physical Anesthesia Plan  ASA: 2  Anesthesia Plan: General   Post-op Pain Management:    Induction: Intravenous  PONV Risk Score and Plan: 4 or greater and Propofol infusion, TIVA and Treatment may vary due to age or medical condition  Airway Management Planned: Natural Airway  Additional Equipment:   Intra-op Plan:   Post-operative Plan:   Informed Consent: I have reviewed the patients History and Physical, chart, labs and discussed the procedure including the risks, benefits and alternatives for the proposed anesthesia with the patient or authorized representative who has indicated his/her understanding and acceptance.       Plan Discussed with: CRNA  Anesthesia Plan Comments:        Anesthesia Quick Evaluation

## 2021-07-19 ENCOUNTER — Encounter: Payer: Self-pay | Admitting: Gastroenterology

## 2021-07-19 ENCOUNTER — Ambulatory Visit: Payer: Commercial Managed Care - PPO | Admitting: Anesthesiology

## 2021-07-19 ENCOUNTER — Encounter
Admission: RE | Disposition: A | Discharge status: Home / Self Care | Payer: Self-pay | Source: Home / Self Care | Attending: Gastroenterology

## 2021-07-19 ENCOUNTER — Ambulatory Visit
Admission: RE | Admit: 2021-07-19 | Discharge: 2021-07-19 | Disposition: A | Discharge status: Home / Self Care | Payer: Commercial Managed Care - PPO | Attending: Gastroenterology | Admitting: Gastroenterology

## 2021-07-19 ENCOUNTER — Other Ambulatory Visit: Payer: Self-pay

## 2021-07-19 DIAGNOSIS — Z1211 Encounter for screening for malignant neoplasm of colon: Secondary | ICD-10-CM

## 2021-07-19 DIAGNOSIS — Z8249 Family history of ischemic heart disease and other diseases of the circulatory system: Secondary | ICD-10-CM | POA: Insufficient documentation

## 2021-07-19 DIAGNOSIS — K635 Polyp of colon: Secondary | ICD-10-CM | POA: Diagnosis not present

## 2021-07-19 DIAGNOSIS — K640 First degree hemorrhoids: Secondary | ICD-10-CM | POA: Diagnosis not present

## 2021-07-19 DIAGNOSIS — Z885 Allergy status to narcotic agent status: Secondary | ICD-10-CM | POA: Diagnosis not present

## 2021-07-19 DIAGNOSIS — R1013 Epigastric pain: Secondary | ICD-10-CM | POA: Diagnosis not present

## 2021-07-19 DIAGNOSIS — D123 Benign neoplasm of transverse colon: Secondary | ICD-10-CM | POA: Diagnosis not present

## 2021-07-19 DIAGNOSIS — Z91040 Latex allergy status: Secondary | ICD-10-CM | POA: Diagnosis not present

## 2021-07-19 DIAGNOSIS — D120 Benign neoplasm of cecum: Secondary | ICD-10-CM | POA: Diagnosis not present

## 2021-07-19 DIAGNOSIS — Z803 Family history of malignant neoplasm of breast: Secondary | ICD-10-CM | POA: Diagnosis not present

## 2021-07-19 DIAGNOSIS — Z809 Family history of malignant neoplasm, unspecified: Secondary | ICD-10-CM | POA: Insufficient documentation

## 2021-07-19 DIAGNOSIS — R7303 Prediabetes: Secondary | ICD-10-CM | POA: Diagnosis not present

## 2021-07-19 DIAGNOSIS — K5730 Diverticulosis of large intestine without perforation or abscess without bleeding: Secondary | ICD-10-CM | POA: Insufficient documentation

## 2021-07-19 DIAGNOSIS — K2970 Gastritis, unspecified, without bleeding: Secondary | ICD-10-CM | POA: Diagnosis not present

## 2021-07-19 DIAGNOSIS — Z888 Allergy status to other drugs, medicaments and biological substances status: Secondary | ICD-10-CM | POA: Insufficient documentation

## 2021-07-19 DIAGNOSIS — Z79899 Other long term (current) drug therapy: Secondary | ICD-10-CM | POA: Insufficient documentation

## 2021-07-19 DIAGNOSIS — K2900 Acute gastritis without bleeding: Secondary | ICD-10-CM | POA: Diagnosis not present

## 2021-07-19 HISTORY — DX: Dizziness and giddiness: R42

## 2021-07-19 HISTORY — PX: ESOPHAGOGASTRODUODENOSCOPY (EGD) WITH PROPOFOL: SHX5813

## 2021-07-19 HISTORY — DX: Presence of dental prosthetic device (complete) (partial): Z97.2

## 2021-07-19 HISTORY — PX: POLYPECTOMY: SHX5525

## 2021-07-19 HISTORY — PX: COLONOSCOPY WITH PROPOFOL: SHX5780

## 2021-07-19 SURGERY — COLONOSCOPY WITH PROPOFOL
Anesthesia: General | Site: Rectum

## 2021-07-19 MED ORDER — STERILE WATER FOR IRRIGATION IR SOLN: Status: DC | PRN

## 2021-07-19 MED ORDER — GLYCOPYRROLATE 0.2 MG/ML IJ SOLN
INTRAMUSCULAR | Status: DC | PRN
  Administered 2021-07-19: .2 mg via INTRAVENOUS

## 2021-07-19 MED ORDER — ACETAMINOPHEN 160 MG/5ML PO SOLN: 325.0000 mg | ORAL | Status: DC | PRN

## 2021-07-19 MED ORDER — SODIUM CHLORIDE 0.9 % IV SOLN: INTRAVENOUS | Status: DC

## 2021-07-19 MED ORDER — PROPOFOL 10 MG/ML IV BOLUS
INTRAVENOUS | Status: DC | PRN
  Administered 2021-07-19: 150 mg via INTRAVENOUS
  Administered 2021-07-19: 40 mg via INTRAVENOUS
  Administered 2021-07-19: 20 mg via INTRAVENOUS
  Administered 2021-07-19: 50 mg via INTRAVENOUS
  Administered 2021-07-19: 20 mg via INTRAVENOUS
  Administered 2021-07-19 (×3): 30 mg via INTRAVENOUS

## 2021-07-19 MED ORDER — LIDOCAINE HCL (CARDIAC) PF 100 MG/5ML IV SOSY
PREFILLED_SYRINGE | INTRAVENOUS | Status: DC | PRN
  Administered 2021-07-19: 30 mg via INTRAVENOUS

## 2021-07-19 MED ORDER — ACETAMINOPHEN 325 MG PO TABS: 650.0000 mg | ORAL_TABLET | Freq: Once | ORAL | Status: DC | PRN

## 2021-07-19 MED ORDER — ONDANSETRON HCL 4 MG/2ML IJ SOLN: 4.0000 mg | Freq: Once | INTRAMUSCULAR | Status: DC | PRN

## 2021-07-19 MED ORDER — LACTATED RINGERS IV SOLN: INTRAVENOUS | Status: DC

## 2021-07-19 SURGICAL SUPPLY — 8 items
BLOCK BITE 60FR ADLT L/F GRN (MISCELLANEOUS) ×3 IMPLANT
FORCEPS BIOP RAD 4 LRG CAP 4 (CUTTING FORCEPS) ×1 IMPLANT
GOWN CVR UNV OPN BCK APRN NK (MISCELLANEOUS) ×8 IMPLANT
GOWN ISOL THUMB LOOP REG UNIV (MISCELLANEOUS) ×6
KIT PRC NS LF DISP ENDO (KITS) ×4 IMPLANT
KIT PROCEDURE OLYMPUS (KITS) ×6
MANIFOLD NEPTUNE II (INSTRUMENTS) ×5 IMPLANT
NS IRRIG 500ML POUR BTL (IV SOLUTION) ×1 IMPLANT

## 2021-07-19 NOTE — Transfer of Care (Signed)
Immediate Anesthesia Transfer of Care Note  Patient: Tammy Swanson  Procedure(s) Performed: COLONOSCOPY WITH BIOPSY (Rectum) POLYPECTOMY (Rectum) ESOPHAGOGASTRODUODENOSCOPY (EGD) WITH BIOPSY (Mouth)  Patient Location: PACU  Anesthesia Type: General  Level of Consciousness: awake, alert  and patient cooperative  Airway and Oxygen Therapy: Patient Spontanous Breathing and Patient connected to supplemental oxygen  Post-op Assessment: Post-op Vital signs reviewed, Patient's Cardiovascular Status Stable, Respiratory Function Stable, Patent Airway and No signs of Nausea or vomiting  Post-op Vital Signs: Reviewed and stable  Complications: No notable events documented.

## 2021-07-19 NOTE — Anesthesia Procedure Notes (Signed)
Date/Time: 07/19/2021 10:10 AM Performed by: Cameron Ali, CRNA Pre-anesthesia Checklist: Patient identified, Emergency Drugs available, Suction available, Timeout performed and Patient being monitored Patient Re-evaluated:Patient Re-evaluated prior to induction Oxygen Delivery Method: Nasal cannula Placement Confirmation: positive ETCO2

## 2021-07-19 NOTE — Op Note (Signed)
[facility] Gastroenterology Patient Name: Tammy Swanson Procedure Date: 07/19/2021 10:04 AM MRN: [id number] Account #: [id number] Date of Birth: November 25, 1966 Admit Type: Outpatient Age: 55 Room: [facility] OR ROOM 01 Gender: Female Note Status: Finalized Procedure:             Upper GI endoscopy Indications:           Functional Dyspepsia, Heartburn Providers:             Lucilla Lame MD, MD Referring MD:          Steele Sizer, MD (Referring MD) Medicines:             Propofol per Anesthesia Complications:         No immediate complications. Procedure:             Pre-Anesthesia Assessment:                        - Prior to the procedure, a History and Physical was                         performed, and patient medications and allergies were                         reviewed. The patient's tolerance of previous                         anesthesia was also reviewed. The risks and benefits                         of the procedure and the sedation options and risks                         were discussed with the patient. All questions were                         answered, and informed consent was obtained. Prior                         Anticoagulants: The patient has taken no previous                         anticoagulant or antiplatelet agents. ASA Grade                         Assessment: II - A patient with mild systemic disease.                         After reviewing the risks and benefits, the patient                         was deemed in satisfactory condition to undergo the                         procedure.                        After obtaining informed consent, the endoscope was  passed under direct vision. Throughout the procedure,                         the patient's blood pressure, pulse, and oxygen                         saturations were monitored continuously. The was                         introduced through the mouth, and  advanced to the                         second part of duodenum. The upper GI endoscopy was                         accomplished without difficulty. The patient tolerated                         the procedure well. Findings:      The examined esophagus was normal.      Localized moderate inflammation characterized by erosions was found in       the gastric antrum. Biopsies were taken with a cold forceps for       histology.      The examined duodenum was normal. Impression:            - Normal esophagus.                        - Gastritis. Biopsied.                        - Normal examined duodenum. Recommendation:        - Discharge patient to home.                        - Resume previous diet.                        - Continue present medications.                        - Await pathology results.                        - Perform a colonoscopy today. Procedure Code(s):     --- Professional ---                        [phone], Esophagogastroduodenoscopy, flexible,                         transoral; with biopsy, single or multiple Diagnosis Code(s):     --- Professional ---                        R12, Heartburn                        K30, Functional dyspepsia                        K29.70, Gastritis, unspecified, without bleeding CPT copyright 2019 American Medical Association. All  rights reserved. The codes documented in this report are preliminary and upon coder review may  be revised to meet current compliance requirements. Lucilla Lame MD, MD 07/19/2021 10:20:34 AM This report has been signed electronically. Number of Addenda: 0 Note Initiated On: 07/19/2021 10:04 AM Total Procedure Duration: 0 hours 3 minutes 16 seconds  Estimated Blood Loss:  Estimated blood loss: none.      [facility]

## 2021-07-19 NOTE — H&P (Signed)
Lucilla Lame, MD [address] [place], [place] [address] Phone:[phone] Fax : [phone]  Primary Care Physician:  Steele Sizer, MD Primary Gastroenterologist:  Dr. Allen Norris  Pre-Procedure History & Physical: HPI:  Tammy Swanson is a 55 y.o. female is here for an endoscopy and colonoscopy.   Past Medical History:  Diagnosis Date   Anxiety    Arthritis    KNEE-LEFT   Asthma    WELL-CONTROLLED   Complication of anesthesia    ASTHMA ATTACK DURING SURGERY X 3   Headache    H/O MIGRAINES   Hypertension    Liver failure ([place]) 1994   PT HAD GALLSTONES-PT ENDED UP WITH PANCREATITIS AND THEN ENDED UP IN LIVER FAILURE   Obesity    Pain in joint involving upper arm    Pancreatitis    Paresthesia of thumb of right hand    PONV (postoperative nausea and vomiting)    Vertigo    no episodes > 1 yr   Vitamin D deficiency    Vitiligo    Wears dentures    Partial lower    Past Surgical History:  Procedure Laterality Date   ABDOMINAL EXPLORATION SURGERY     x3   ABDOMINAL HYSTERECTOMY  2013   ABDOMINAL HYSTERECTOMY  2014   CARPAL TUNNEL RELEASE Right 12/06/2016   Procedure: CARPAL TUNNEL RELEASE;  Surgeon: Hessie Knows, MD;  Location: [facility] ORS;  Service: Orthopedics;  Laterality: Right;   CHOLECYSTECTOMY     FOOT SURGERY     FOOT SURGERY Left 2007/2009   Dr. Elvina Mattes   KNEE ARTHROSCOPY Left 1985   KNEE SURGERY Left    MANDIBLE FRACTURE SURGERY     NASAL SINUS SURGERY     NASAL SINUS SURGERY  2012   RECONSTRUCTION MANDIBLE / MAXILLA  1992   TONSILLECTOMY     TONSILLECTOMY  1981   TRIGGER FINGER RELEASE      Prior to Admission medications   Medication Sig Start Date End Date Taking? Authorizing Provider  albuterol (VENTOLIN HFA) 108 (90 Base) MCG/ACT inhaler TAKE 2 PUFFS BY MOUTH EVERY 6 HOURS AS NEEDED FOR WHEEZE OR SHORTNESS OF BREATH 07/13/21  Yes Sowles, Drue Stager, MD  budesonide-formoterol (SYMBICORT) 80-4.5 MCG/ACT inhaler Inhale 2 puffs  into the lungs in the morning and at bedtime. 03/16/21  Yes Sowles, Drue Stager, MD  cholecalciferol (VITAMIN D) 1000 units tablet Take 1,000 Units by mouth daily.   Yes [provider]  Cyanocobalamin (B-12) 1000 MCG SUBL Place 1 tablet under the tongue daily.   Yes [provider]  estradiol (VIVELLE-DOT) 0.05 MG/24HR patch APPLY 1 PATCH TWICE A WEEK 08/03/18  Yes Ward, Honor Loh, MD  losartan-hydrochlorothiazide (HYZAAR) 50-12.5 MG tablet Take 1 tablet by mouth daily. 03/16/21  Yes Steele Sizer, MD  MAGNESIUM PO Take by mouth.   Yes [provider]  nadolol (CORGARD) 20 MG tablet Take 1 tablet (20 mg total) by mouth daily. In place of Atenolol 03/16/21  Yes Sowles, Drue Stager, MD  nortriptyline (PAMELOR) 50 MG capsule Take 50 mg by mouth at bedtime.   Yes Anabel Bene, MD  Sod Picosulfate-Mag Ox-Cit Acd (CLENPIQ) 10-3.5-12 MG-GM -GM/160ML SOLN Take 320 mLs by mouth as directed. 07/12/21  Yes Lucilla Lame, MD  venlafaxine XR (EFFEXOR-XR) 150 MG 24 hr capsule Take 1 capsule (150 mg total) by mouth daily with breakfast. 06/21/21  Yes Sowles, Drue Stager, MD  Adapalene-Benzoyl Peroxide 0.3-2.5 % GEL Epiduo Forte 0.3 %-2.5 % topical gel with pump Patient  not taking: Reported on 07/07/2021    [provider]  Crisaborole 2 % OINT Apply 1 each topically daily. Patient not taking: Reported on 07/07/2021    Brendolyn Patty, MD    Allergies as of 05/24/2021 - Review Complete 05/24/2021  Allergen Reaction Noted   Latex Anaphylaxis 03/31/2015   Belviq [lorcaserin hcl]  08/20/2018   Imitrex [sumatriptan]  09/24/2019   Vicodin [hydrocodone-acetaminophen] Hives 08/24/2016    Family History  Problem Relation Age of Onset   Hypertension Mother    Cancer Mother        Breast, colon   HIV/AIDS Mother    Breast cancer Mother    Cancer Father        lung cancer   Hypertension Sister    Hypertension Maternal Grandmother    Hypertension Maternal Grandfather    Cancer Maternal Aunt         3 sisters with Breast Cancer   Breast cancer Maternal Aunt    Breast cancer Maternal Aunt    Cancer Paternal Aunt    Cancer Paternal Uncle     Social History   Socioeconomic History   Marital status: Married    Spouse name: Gaspar Bidding    Number of children: 2   Years of education: Not on file   Highest education level: Bachelor's degree (e.g., BA, AB, BS)  Occupational History   Occupation: Investment banker, corporate: [employer]  Tobacco Use   Smoking status: Never   Smokeless tobacco: Never  Vaping Use   Vaping Use: Never used  Substance and Sexual Activity   Alcohol use: Yes    Comment: occasionally   Drug use: No   Sexual activity: Yes    Partners: Male    Comment: Hysterectomy  Other Topics Concern   Not on file  Social History Narrative   She is a Chiropractor for [employer]   Financial Resource Strain: Not on file  Food Insecurity: Not on file  Transportation Needs: Not on file  Physical Activity: Not on file  Stress: Not on file  Social Connections: Not on file  Intimate Partner Violence: Not on file    Review of Systems: See HPI, otherwise negative ROS  Physical Exam: Ht '5\' 2"'$  (1.575 m)   Wt 88.9 kg   BMI 35.85 kg/m  General:   Alert,  pleasant and cooperative in NAD Head:  Normocephalic and atraumatic. Neck:  Supple; no masses or thyromegaly. Lungs:  Clear throughout to auscultation.    Heart:  Regular rate and rhythm. Abdomen:  Soft, nontender and nondistended. Normal bowel sounds, without guarding, and without rebound.   Neurologic:  Alert and  oriented x4;  grossly normal neurologically.  Impression/Plan: Tammy Swanson is here for an endoscopy and colonoscopy to be performed for screening and GERED  Risks, benefits, limitations, and alternatives regarding  endoscopy and colonoscopy have been reviewed with the patient.  Questions have been answered.  All parties agreeable.   Lucilla Lame, MD   07/19/2021, 9:23 AM

## 2021-07-19 NOTE — Op Note (Addendum)
[facility] Gastroenterology Patient Name: Tammy Swanson Procedure Date: 07/19/2021 10:03 AM MRN: [id number] Account #: [id number] Date of Birth: 1966/09/22 Admit Type: Outpatient Age: 55 Room: [facility] OR ROOM 01 Gender: Female Note Status: Finalized Procedure:             Colonoscopy Indications:           Screening for colorectal malignant neoplasm Providers:             Lucilla Lame MD, MD Referring MD:          Bethena Roys. Sowles, MD (Referring MD) Medicines:             Propofol per Anesthesia Complications:         No immediate complications. Procedure:             Pre-Anesthesia Assessment:                        - Prior to the procedure, a History and Physical was                         performed, and patient medications and allergies were                         reviewed. The patient's tolerance of previous                         anesthesia was also reviewed. The risks and benefits                         of the procedure and the sedation options and risks                         were discussed with the patient. All questions were                         answered, and informed consent was obtained. Prior                         Anticoagulants: The patient has taken no previous                         anticoagulant or antiplatelet agents. ASA Grade                         Assessment: II - A patient with mild systemic disease.                         After reviewing the risks and benefits, the patient                         was deemed in satisfactory condition to undergo the                         procedure.                        After obtaining informed consent, the colonoscope was  passed under direct vision. Throughout the procedure,                         the patient's blood pressure, pulse, and oxygen                         saturations were monitored continuously. The                         Colonoscope was introduced  through the anus and                         advanced to the the cecum, identified by appendiceal                         orifice and ileocecal valve. The colonoscopy was                         performed without difficulty. The patient tolerated                         the procedure well. The quality of the bowel                         preparation was excellent. Findings:      The perianal and digital rectal examinations were normal.      A 5 mm polyp was found in the cecum. The polyp was sessile. The polyp       was removed with a cold biopsy forceps. Resection and retrieval were       complete.      Two sessile polyps were found in the transverse colon. The polyps were 3       to 4 mm in size. These polyps were removed with a cold biopsy forceps.       Resection and retrieval were complete.      Non-bleeding internal hemorrhoids were found during retroflexion. The       hemorrhoids were Grade I (internal hemorrhoids that do not prolapse).      Multiple small-mouthed diverticula were found in the sigmoid colon. Impression:            - One 5 mm polyp in the cecum, removed with a cold                         biopsy forceps. Resected and retrieved.                        - Two 3 to 4 mm polyps in the transverse colon,                         removed with a cold biopsy forceps. Resected and                         retrieved.                        - Non-bleeding internal hemorrhoids.                        - Diverticulosis in the sigmoid  colon. Recommendation:        - Discharge patient to home.                        - Resume previous diet.                        - Continue present medications.                        - Await pathology results.                        - Repeat colonoscopy in 5 years for surveillance if                         adenomatous otherwise 10 years.. Procedure Code(s):     --- Professional ---                        [phone], Colonoscopy, flexible; with biopsy,  single or                         multiple Diagnosis Code(s):     --- Professional ---                        Z12.11, Encounter for screening for malignant neoplasm                         of colon                        K63.5, Polyp of colon CPT copyright 2019 American Medical Association. All rights reserved. The codes documented in this report are preliminary and upon coder review may  be revised to meet current compliance requirements. Lucilla Lame MD, MD 07/19/2021 10:36:13 AM This report has been signed electronically. Number of Addenda: 0 Note Initiated On: 07/19/2021 10:03 AM Scope Withdrawal Time: 0 hours 6 minutes 27 seconds  Total Procedure Duration: 0 hours 10 minutes 48 seconds  Estimated Blood Loss:  Estimated blood loss: none.      [facility]

## 2021-07-19 NOTE — Anesthesia Postprocedure Evaluation (Signed)
Anesthesia Post Note  Patient: Tammy Swanson  Procedure(s) Performed: COLONOSCOPY WITH BIOPSY (Rectum) POLYPECTOMY (Rectum) ESOPHAGOGASTRODUODENOSCOPY (EGD) WITH BIOPSY (Mouth)     Patient location during evaluation: PACU Anesthesia Type: General Level of consciousness: awake and alert, oriented and patient cooperative Pain management: pain level controlled Vital Signs Assessment: post-procedure vital signs reviewed and stable Respiratory status: spontaneous breathing, nonlabored ventilation and respiratory function stable Cardiovascular status: blood pressure returned to baseline and stable Postop Assessment: adequate PO intake Anesthetic complications: no   No notable events documented.  Darrin Nipper

## 2021-07-20 ENCOUNTER — Encounter: Payer: Self-pay | Admitting: Gastroenterology

## 2021-07-21 LAB — SURGICAL PATHOLOGY

## 2021-07-23 ENCOUNTER — Encounter: Payer: Self-pay | Admitting: Gastroenterology

## 2021-09-03 ENCOUNTER — Other Ambulatory Visit: Payer: Self-pay | Admitting: Neurology

## 2021-09-03 DIAGNOSIS — H53149 Visual discomfort, unspecified: Secondary | ICD-10-CM

## 2021-09-03 DIAGNOSIS — F40298 Other specified phobia: Secondary | ICD-10-CM

## 2021-09-03 DIAGNOSIS — R410 Disorientation, unspecified: Secondary | ICD-10-CM

## 2021-09-03 DIAGNOSIS — R519 Headache, unspecified: Secondary | ICD-10-CM

## 2021-09-12 ENCOUNTER — Other Ambulatory Visit: Payer: Self-pay | Admitting: Family Medicine

## 2021-09-12 DIAGNOSIS — G43109 Migraine with aura, not intractable, without status migrainosus: Secondary | ICD-10-CM

## 2021-09-12 DIAGNOSIS — I10 Essential (primary) hypertension: Secondary | ICD-10-CM

## 2021-09-15 ENCOUNTER — Encounter: Payer: Self-pay | Admitting: Family Medicine

## 2021-09-15 ENCOUNTER — Other Ambulatory Visit: Payer: Self-pay

## 2021-09-15 ENCOUNTER — Ambulatory Visit: Payer: Commercial Managed Care - PPO | Admitting: Family Medicine

## 2021-09-15 DIAGNOSIS — F320 Major depressive disorder, single episode, mild: Secondary | ICD-10-CM | POA: Diagnosis not present

## 2021-09-15 DIAGNOSIS — I10 Essential (primary) hypertension: Secondary | ICD-10-CM

## 2021-09-15 DIAGNOSIS — G43109 Migraine with aura, not intractable, without status migrainosus: Secondary | ICD-10-CM

## 2021-09-15 DIAGNOSIS — R7303 Prediabetes: Secondary | ICD-10-CM

## 2021-09-15 DIAGNOSIS — Z23 Encounter for immunization: Secondary | ICD-10-CM

## 2021-09-15 DIAGNOSIS — K2930 Chronic superficial gastritis without bleeding: Secondary | ICD-10-CM | POA: Diagnosis not present

## 2021-09-15 DIAGNOSIS — J4530 Mild persistent asthma, uncomplicated: Secondary | ICD-10-CM

## 2021-09-15 DIAGNOSIS — E538 Deficiency of other specified B group vitamins: Secondary | ICD-10-CM

## 2021-09-15 DIAGNOSIS — F419 Anxiety disorder, unspecified: Secondary | ICD-10-CM

## 2021-09-15 DIAGNOSIS — G44031 Episodic paroxysmal hemicrania, intractable: Secondary | ICD-10-CM

## 2021-09-15 DIAGNOSIS — R140 Abdominal distension (gaseous): Secondary | ICD-10-CM

## 2021-09-15 MED ORDER — OMEPRAZOLE 20 MG PO CPDR
20.0000 mg | DELAYED_RELEASE_CAPSULE | Freq: Every day | ORAL | 0 refills | Status: DC
Start: 2021-09-15 — End: 2021-12-14

## 2021-09-15 NOTE — Patient Instructions (Signed)
Low-FODMAP Eating Plan °FODMAP stands for fermentable oligosaccharides, disaccharides, monosaccharides, and polyols. These are sugars that are hard for some people to digest. A low-FODMAP eating plan may help some people who have irritable bowel syndrome (IBS) and certain other bowel (intestinal) diseases to manage their symptoms. °This meal plan can be complicated to follow. Work with a diet and nutrition specialist (dietitian) to make a low-FODMAP eating plan that is right for you. A dietitian can help make sure that you get enough nutrition from this diet. °What are tips for following this plan? °Reading food labels °Check labels for hidden FODMAPs such as: °High-fructose syrup. °Honey. °Agave. °Natural fruit flavors. °Onion or garlic powder. °Choose low-FODMAP foods that contain 3-4 grams of fiber per serving. °Check food labels for serving sizes. Eat only one serving at a time to make sure FODMAP levels stay low. °Shopping °Shop with a list of foods that are recommended on this diet and make a meal plan. °Meal planning °Follow a low-FODMAP eating plan for up to 6 weeks, or as told by your health care provider or dietitian. °To follow the eating plan: °Eliminate high-FODMAP foods from your diet completely. Choose only low-FODMAP foods to eat. You will do this for 2-6 weeks. °Gradually reintroduce high-FODMAP foods into your diet one at a time. Most people should wait a few days before introducing the next new high-FODMAP food into their meal plan. Your dietitian can recommend how quickly you may reintroduce foods. °Keep a daily record of what and how much you eat and drink. Make note of any symptoms that you have after eating. °Review your daily record with a dietitian regularly to identify which foods you can eat and which foods you should avoid. °General tips °Drink enough fluid each day to keep your urine pale yellow. °Avoid processed foods. These often have added sugar and may be high in FODMAPs. °Avoid most  dairy products, whole grains, and sweeteners. °Work with a dietitian to make sure you get enough fiber in your diet. °Avoid high FODMAP foods at meals to manage symptoms. °Recommended foods °Fruits °Bananas, oranges, tangerines, lemons, limes, blueberries, raspberries, strawberries, grapes, cantaloupe, honeydew melon, kiwi, papaya, passion fruit, and pineapple. Limited amounts of dried cranberries, banana chips, and shredded coconut. °Vegetables °Eggplant, zucchini, cucumber, peppers, green beans, bean sprouts, lettuce, arugula, kale, Swiss chard, spinach, collard greens, bok choy, summer squash, potato, and tomato. Limited amounts of corn, carrot, and sweet potato. Green parts of scallions. °Grains °Gluten-free grains, such as rice, oats, buckwheat, quinoa, corn, polenta, and millet. Gluten-free pasta, bread, or cereal. Rice noodles. Corn tortillas. °Meats and other proteins °Unseasoned beef, pork, poultry, or fish. Eggs. Bacon. Tofu (firm) and tempeh. Limited amounts of nuts and seeds, such as almonds, walnuts, brazil nuts, pecans, peanuts, nut butters, pumpkin seeds, chia seeds, and sunflower seeds. °Dairy °Lactose-free milk, yogurt, and kefir. Lactose-free cottage cheese and ice cream. Non-dairy milks, such as almond, coconut, hemp, and rice milk. Non-dairy yogurt. Limited amounts of goat cheese, brie, mozzarella, parmesan, swiss, and other hard cheeses. °Fats and oils °Butter-free spreads. Vegetable oils, such as olive, canola, and sunflower oil. °Seasoning and other foods °Artificial sweeteners with names that do not end in "ol," such as aspartame, saccharine, and stevia. Maple syrup, white table sugar, raw sugar, brown sugar, and molasses. Mayonnaise, soy sauce, and tamari. Fresh basil, coriander, parsley, rosemary, and thyme. °Beverages °Water and mineral water. Sugar-sweetened soft drinks. Small amounts of orange juice or cranberry juice. Black and green tea. Most dry wines. Coffee. °  The items listed above  may not be a complete list of foods and beverages you can eat. Contact a dietitian for more information. °Foods to avoid °Fruits °Fresh, dried, and juiced forms of apple, pear, watermelon, peach, plum, cherries, apricots, blackberries, boysenberries, figs, nectarines, and mango. Avocado. °Vegetables °Chicory root, artichoke, asparagus, cabbage, snow peas, Brussels sprouts, broccoli, sugar snap peas, mushrooms, celery, and cauliflower. Onions, garlic, leeks, and the white part of scallions. °Grains °Wheat, including kamut, durum, and semolina. Barley and bulgur. Couscous. Wheat-based cereals. Wheat noodles, bread, crackers, and pastries. °Meats and other proteins °Fried or fatty meat. Sausage. Cashews and pistachios. Soybeans, baked beans, black beans, chickpeas, kidney beans, fava beans, navy beans, lentils, black-eyed peas, and split peas. °Dairy °Milk, yogurt, ice cream, and soft cheese. Cream and sour cream. Milk-based sauces. Custard. Buttermilk. Soy milk. °Seasoning and other foods °Any sugar-free gum or candy. Foods that contain artificial sweeteners such as sorbitol, mannitol, isomalt, or xylitol. Foods that contain honey, high-fructose corn syrup, or agave. Bouillon, vegetable stock, beef stock, and chicken stock. Garlic and onion powder. Condiments made with onion, such as hummus, chutney, pickles, relish, salad dressing, and salsa. Tomato paste. °Beverages °Chicory-based drinks. Coffee substitutes. Chamomile tea. Fennel tea. Sweet or fortified wines such as port or sherry. Diet soft drinks made with isomalt, mannitol, maltitol, sorbitol, or xylitol. Apple, pear, and mango juice. Juices with high-fructose corn syrup. °The items listed above may not be a complete list of foods and beverages you should avoid. Contact a dietitian for more information. °Summary °FODMAP stands for fermentable oligosaccharides, disaccharides, monosaccharides, and polyols. These are sugars that are hard for some people to  digest. °A low-FODMAP eating plan is a short-term diet that helps to ease symptoms of certain bowel diseases. °The eating plan usually lasts up to 6 weeks. After that, high-FODMAP foods are reintroduced gradually and one at a time. This can help you find out which foods may be causing symptoms. °A low-FODMAP eating plan can be complicated. It is best to work with a dietitian who has experience with this type of plan. °This information is not intended to replace advice given to you by your health care provider. Make sure you discuss any questions you have with your health care provider. °Document Revised: 04/16/2020 Document Reviewed: 04/16/2020 °Elsevier Patient Education © 2022 [employer] ° °

## 2021-09-15 NOTE — Progress Notes (Signed)
Name: Tammy Swanson   MRN: [id number]    DOB: [birth year]   Date:09/15/2021       Progress Note  Subjective  Chief Complaint  Follow Up  HPI  Depression Mild Recurrent: she is on Effexor, she is anxious about having MRI tomorrow and worried about her sister that will have a pituitary adenoma removed. Phq 9 is    Headaches: she has a history of migraines as an young adult, she was doing well, however last Fall she noticed  symptoms returned. We tried Topamax and it was working but it caused brain fog and because of severe symptoms  she was referred to Dr. Melrose Nakayama, she is now off Topamax and taking Nortriptyline 50 mg every night , she is on  Effexor 150 mg  and Nadolol. Migraine is usually throbbing, severe, usually right side of head. She stopped Imitrex since it is not as efficacious and also does not like side effects. She states since last visit Emgality not cover by insurance or [place]. She states intense migraine is not present but still has a dull ache behind right eye, her sister had a pituitary adenoma and Dr. Melrose Nakayama has ordered a brain MRI that she will have it done tomorrow.   HTN: bp is at goal, no side effects of medication, no chest pain or palpitation Denies dizziness .Taking losartan hctz and Nadolol and bp is at goal today   Pre-diabetes: no polyphagia, polydipsia or polyuria. Last A1C was 6.1 %, we will recheck levels today   Vitamin D and B12 deficiency: doing well on otc supplementation . Unchanged   GERD/Chronic gastritis: she has noticed bloating sensation, abdominal discomfort without change in bowel movements or blood in stools. She is able to pass gas, no weight loss or change in appetite. Had EGD that showed chronic gastritis and colonoscopy showed diverticulosis and a few colon polyps. Dr. Allen Norris suggested to eliminate dairy but she only stopped for a couple of weeks without noticing any improvement. Discussed adding PPI and trying FodMap diet to see if improves  symptoms. Explained no red flags.   Asthma mild persistent  she states present in the Spring and Fall, she is doing well at this time. She is currently not using Symbicort or Albuterol because she has been feeling well, no cough, wheezing or SOB  Tinnitus resolved since last visit   Obesity Morbid: BMI above 35  with co-morbidities such as GERD, HTN and pre-diabetes , discussed importance of weight loss   Patient Active Problem List   Diagnosis Date Noted   Screen for colon cancer    Polyp of transverse colon    Dyspepsia    Acute gastritis without hemorrhage    Transient confusion 05/25/2020   Phonophobia 03/06/2020   Photophobia 03/06/2020   Headache disorder 10/22/2019   Sleep disorder 10/22/2019   History of carpal tunnel surgery of right wrist 12/06/2016   Prediabetes 10/15/2016   History of liver failure 08/24/2016   Migraine without aura and without status migrainosus, not intractable 09/10/2015   Eczema 09/10/2015   Bad memory 06/01/2015   Menopause 06/01/2015   Cough variant asthma 06/01/2015   Obesity (BMI 30-39.9) 06/01/2015   Nodule, subcutaneous 06/01/2015   Lesion of ulnar nerve 06/01/2015   Vitiligo 06/01/2015   Benign hypertension 09/03/2010    Past Surgical History:  Procedure Laterality Date   ABDOMINAL EXPLORATION SURGERY     x3   ABDOMINAL HYSTERECTOMY  2013   ABDOMINAL HYSTERECTOMY  2014   CARPAL  TUNNEL RELEASE Right 12/06/2016   Procedure: CARPAL TUNNEL RELEASE;  Surgeon: Hessie Knows, MD;  Location: [facility];  Service: Orthopedics;  Laterality: Right;   CHOLECYSTECTOMY     COLONOSCOPY WITH PROPOFOL N/A 07/19/2021   Procedure: COLONOSCOPY WITH BIOPSY;  Surgeon: Lucilla Lame, MD;  Location: [place];  Service: Endoscopy;  Laterality: N/A;   ESOPHAGOGASTRODUODENOSCOPY (EGD) WITH PROPOFOL N/A 07/19/2021   Procedure: ESOPHAGOGASTRODUODENOSCOPY (EGD) WITH BIOPSY;  Surgeon: Lucilla Lame, MD;  Location: [place];  Service: Endoscopy;   Laterality: N/A;  Latex   FOOT SURGERY     FOOT SURGERY Left 2007/2009   Dr. Elvina Mattes   KNEE ARTHROSCOPY Left 1985   KNEE SURGERY Left    MANDIBLE FRACTURE SURGERY     NASAL SINUS SURGERY     NASAL SINUS SURGERY  2012   POLYPECTOMY N/A 07/19/2021   Procedure: POLYPECTOMY;  Surgeon: Lucilla Lame, MD;  Location: [place];  Service: Endoscopy;  Laterality: N/A;   RECONSTRUCTION MANDIBLE / MAXILLA  1992   TONSILLECTOMY     TONSILLECTOMY  1981   TRIGGER FINGER RELEASE      Family History  Problem Relation Age of Onset   Hypertension Mother    Cancer Mother        Breast, colon   HIV/AIDS Mother    Breast cancer Mother    Cancer Father        lung cancer   Hypertension Sister    Hypertension Maternal Grandmother    Hypertension Maternal Grandfather    Cancer Maternal Aunt        3 sisters with Breast Cancer   Breast cancer Maternal Aunt    Breast cancer Maternal Aunt    Cancer Paternal Aunt    Cancer Paternal Uncle     Social History   Tobacco Use   Smoking status: Never   Smokeless tobacco: Never  Substance Use Topics   Alcohol use: Yes    Comment: occasionally     Current Outpatient Medications:    albuterol (VENTOLIN HFA) 108 (90 Base) MCG/ACT inhaler, TAKE 2 PUFFS BY MOUTH EVERY 6 HOURS AS NEEDED FOR WHEEZE OR SHORTNESS OF BREATH, Disp: 18 each, Rfl: 0   budesonide-formoterol (SYMBICORT) 80-4.5 MCG/ACT inhaler, Inhale 2 puffs into the lungs in the morning and at bedtime., Disp: 10.2 each, Rfl: 2   cholecalciferol (VITAMIN D) 1000 units tablet, Take 1,000 Units by mouth daily., Disp: , Rfl:    Cyanocobalamin (B-12) 1000 MCG SUBL, Place 1 tablet under the tongue daily., Disp: , Rfl:    estradiol (VIVELLE-DOT) 0.05 MG/24HR patch, APPLY 1 PATCH TWICE A WEEK, Disp: , Rfl: 11   losartan-hydrochlorothiazide (HYZAAR) 50-12.5 MG tablet, TAKE 1 TABLET BY MOUTH EVERY DAY, Disp: 90 tablet, Rfl: 0   MAGNESIUM PO, Take by mouth., Disp: , Rfl:    nadolol (CORGARD) 20 MG  tablet, TAKE 1 TABLET (20 MG TOTAL) BY MOUTH DAILY. IN PLACE OF ATENOLOL, Disp: 90 tablet, Rfl: 0   nortriptyline (PAMELOR) 50 MG capsule, Take 50 mg by mouth at bedtime., Disp: , Rfl:    omeprazole (PRILOSEC) 20 MG capsule, Take 1 capsule (20 mg total) by mouth daily before breakfast., Disp: 90 capsule, Rfl: 0   Sod Picosulfate-Mag Ox-Cit Acd (CLENPIQ) 10-3.5-12 MG-GM -GM/160ML SOLN, Take 320 mLs by mouth as directed., Disp: 320 mL, Rfl: 0   venlafaxine XR (EFFEXOR-XR) 150 MG 24 hr capsule, Take 1 capsule (150 mg total) by mouth daily with breakfast., Disp: 90 capsule, Rfl: 1  Allergies  Allergen Reactions   Latex Anaphylaxis   Belviq [Lorcaserin Hcl]     Dry mouth, palpitation   Tomato Hives    "Cherry tomatoes"   Vicodin [Hydrocodone-Acetaminophen] Hives   Imitrex [Sumatriptan] Palpitations    Intolerance     I personally reviewed active problem list, medication list, allergies, family history, social history with the patient/caregiver today.   ROS  Constitutional: Negative for fever or weight change.  Respiratory: Negative for cough and shortness of breath.   Cardiovascular: Negative for chest pain or palpitations.  Gastrointestinal: Negative for abdominal pain, no bowel changes.  Musculoskeletal: Negative for gait problem or joint swelling.  Skin: Negative for rash.  Neurological: Negative for dizziness, positive for  headache.  No other specific complaints in a complete review of systems (except as listed in HPI above).   Objective  Vitals:   09/15/21 1451  BP: 130/84  Pulse: 86  Resp: 16  Temp: 98.2 F (36.8 C)  SpO2: 99%  Weight: 199 lb (90.3 kg)  Height: 5\' 2"  (1.575 m)    Body mass index is 36.4 kg/m.  Physical Exam  Constitutional: Patient appears well-developed and well-nourished. Obese  No distress.  HEENT: head atraumatic, normocephalic, pupils equal and reactive to light, neck supple Cardiovascular: Normal rate, regular rhythm and normal heart  sounds.  No murmur heard. No BLE edema. Pulmonary/Chest: Effort normal and breath sounds normal. No respiratory distress. Abdominal: Soft.  There is no tenderness. Psychiatric: Patient has a normal mood and affect. behavior is normal. Judgment and thought content normal.   Recent Results (from the past 2160 hour(s))  Surgical pathology     Status: None   Collection Time: 07/19/21 10:14 AM  Result Value Ref Range   SURGICAL PATHOLOGY      SURGICAL PATHOLOGY CASE: [id number] PATIENT: [facility] Surgical Pathology Report     Specimen Submitted: A. Stomach, antrum; cbx B. Colon polyp, cecum; cbx C. Colon polyp x2, transverse; cbx  Clinical History: Dyspepsia K30 screening Z12.11.  Gastritis; colon polyps      DIAGNOSIS: A. STOMACH, ANTRUM; COLD BIOPSY: - OXYNTIC MUCOSA WITH MILD CHRONIC INFLAMMATION. - NEGATIVE FOR ACTIVE INFLAMMATION AND H PYLORI. - NEGATIVE FOR INTESTINAL METAPLASIA, DYSPLASIA, AND MALIGNANCY.  B. COLON POLYP, CECUM; COLD BIOPSY: - TUBULAR ADENOMA. - NEGATIVE FOR HIGH GRADE DYSPLASIA AND MALIGNANCY.  C. COLON POLYP X2, TRANSVERSE; COLD BIOPSY: - TUBULAR ADENOMA, ONE FRAGMENT. - INTRAMUCOSAL LYMPHOID AGGREGATES, ONE FRAGMENT. - NEGATIVE FOR HIGH GRADE DYSPLASIA AND MALIGNANCY.  Comment: Deeper levels were examined (part C).   GROSS DESCRIPTION: A. Labeled: Gastric antrum biopsy-gastritis (per requisition cold biopsy) Received: Formalin Collection tim e: 10:14 AM on 07/19/2021 Placed into formalin time: 10:14 AM on 07/19/2021 Tissue fragment(s): 2 Size: Range from 0.3-0.4 cm Description: Tan soft tissue fragments Entirely submitted in 1 cassette.  B. Labeled: Cecal polyp x1 (per requisition cold biopsy) Received: Formalin Collection time: 10:30 AM on 07/19/2021 Placed into formalin time: 10:30 AM on 07/19/2021 Tissue fragment(s): 2 Size: Range from 0.1-0.4 cm Description: Tan soft tissue fragments Entirely submitted in 1  cassette.  C. Labeled: Transverse colon polyp x2 (per requisition cold biopsy) Received: Formalin Collection time: 10:30 AM on 07/19/2021 Placed into formalin time: 10:30 AM on 07/19/2021 Tissue fragment(s): 2 Size: Each 0.4 cm Description: Tan soft tissue fragments Entirely submitted in 1 cassette.  RB 07/20/2021  Final Diagnosis performed by Betsy Pries, MD.   Electronically signed 07/21/2021 12:23:34PM The electronic signature indicates that the named Attending Pathologist has evaluated  the spec [employer] component performed at [facility], [address], [place], [place] [address] Lab: [phone] Dir: Rush Farmer, MD, MMM  Professional component performed at [facility], [facility], [place], [place], [place] [address] Lab: [phone] Dir: Dellia Nims. Rubinas, MD     PHQ2/9: Depression screen [facility] 2/9 09/15/2021 06/21/2021 03/16/2021 08/26/2020 12/25/2019  Decreased Interest 1 1 0 0 0  Down, Depressed, Hopeless 2 1 1  0 0  PHQ - 2 Score 3 2 1  0 0  Altered sleeping 0 0 1 0 0  Tired, decreased energy 1 0 0 2 1  Change in appetite 0 0 3 1 0  Feeling bad or failure about yourself  0 2 0 0 0  Trouble concentrating 2 3 3 3 1   Moving slowly or fidgety/restless 0 0 0 0 1  Suicidal thoughts 0 0 0 0 0  PHQ-9 Score 6 7 8 6 3   Difficult doing work/chores - - - Somewhat difficult Not difficult at all  Some recent data might be hidden    phq 9 is positive   Fall Risk: Fall Risk  09/15/2021 06/21/2021 03/16/2021 08/26/2020 12/25/2019  Falls in the past year? 1 0 0 0 0  Number falls in past yr: 0 0 0 0 0  Injury with Fall? 1 0 0 0 0  Comment - - - - -  Risk for fall due to : No Fall Risks - - - -  Follow up Falls prevention discussed - - - -     Assessment & Plan  1. Morbid obesity ([place])  Discussed with the patient the risk posed by an increased BMI. Discussed importance of portion control, calorie counting and at least 150 minutes of physical activity weekly.  Avoid sweet beverages and drink more water. Eat at least 6 servings of fruit and vegetables daily    2. Mild major depression ([place])   3. Need for shingles vaccine  - Flu Vaccine QUAD 6+ mos PF IM (Fluarix Quad PF)  4. Need for influenza vaccination  - Varicella-zoster vaccine IM (Shingrix)  5. Chronic superficial gastritis without bleeding  - omeprazole (PRILOSEC) 20 MG capsule; Take 1 capsule (20 mg total) by mouth daily before breakfast.  Dispense: 90 capsule; Refill: 0  6. B12 deficiency   7. Prediabetes  Discussed low carb diet  8. Essential hypertension  At goal   9. Migraine with aura and without status migrainosus, not intractable   10. Intractable episodic paroxysmal hemicrania  Under the care of Dr. Melrose Nakayama   11. Mild persistent asthma without complication   12. Bloating   13. Anxiety  stable

## 2021-09-16 ENCOUNTER — Ambulatory Visit
Admission: RE | Admit: 2021-09-16 | Discharge: 2021-09-16 | Disposition: A | Discharge status: Home / Self Care | Payer: Commercial Managed Care - PPO | Source: Ambulatory Visit | Attending: Neurology | Admitting: Neurology

## 2021-09-16 DIAGNOSIS — F40298 Other specified phobia: Secondary | ICD-10-CM | POA: Diagnosis present

## 2021-09-16 DIAGNOSIS — R410 Disorientation, unspecified: Secondary | ICD-10-CM | POA: Insufficient documentation

## 2021-09-16 DIAGNOSIS — H53149 Visual discomfort, unspecified: Secondary | ICD-10-CM | POA: Diagnosis present

## 2021-09-16 DIAGNOSIS — R519 Headache, unspecified: Secondary | ICD-10-CM

## 2021-09-16 MED ORDER — GADOBUTROL 1 MMOL/ML IV SOLN
9.0000 mL | Freq: Once | INTRAVENOUS | Status: AC | PRN
  Administered 2021-09-16: 9 mL via INTRAVENOUS

## 2021-09-21 ENCOUNTER — Other Ambulatory Visit: Payer: Self-pay | Admitting: Family Medicine

## 2021-09-27 ENCOUNTER — Ambulatory Visit: Payer: Self-pay | Admitting: *Deleted

## 2021-09-27 NOTE — Telephone Encounter (Signed)
Patient already triaged by another NT. Recommendations made to patient. See encounter. At 1:03pm 09/27/21.

## 2021-09-27 NOTE — Telephone Encounter (Signed)
I returned pt's call.   She had called in earlier c/o being lightheaded.  She mentioned several issues as I was triaging her.   It started Saturday night or early Sunday morning she had a bad sore throat and her back hurt real bad.  She is having periodic coughing spells.   Yesterday she did cough up a thick greenish-brown mucus glob.  She c/o drainage down the back of her throat and her right ear itching.   She is also c/o tenderness around her right ear and in her neck on the right side.   Denies having a runny nose.   She did c/o having bad chills Saturday night which Tylenol controlled.    Yesterday she started feeling lightheaded.   She denies feeling like she could pass out but it's not vertigo but a lightheaded feeling.   She is so dizzy that she's mostly staying in bed.   She can't walk around the house without holding onto things.   Earlier today she went downstairs going one step at a time and holding onto the hand rail to let the dog out.  She then had to come up the stairs slowly holding onto the hand rail due to the dizziness.   "Now I'm really feeling so dizzy and lightheaded".    "I'm not going to do that again".    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   All their Covid tests are negative.  I have referred her to the urgent care/ED due to the severe dizziness per the protocol.    I went over getting up slowly from bed and sitting and moving slowly to prevent falling.    She verbalized understanding that she was doing that already.    She is agreeable to going.   She is going to call her husband at work and have him come take her to the urgent care.   She mentioned at the end of the conversation she sees Dr. Melrose Nakayama a neurologist for severe migraines.   She does not get dizzy with migraines and denies having a headache with these symptoms.    She had an MRI of her head done on 10/6 but she hasn't heard the results yet.   She has a follow  up appt with Dr. Melrose Nakayama.  I sent my notes to [facility].

## 2021-09-27 NOTE — Telephone Encounter (Signed)
Reason for Disposition  Patient sounds very sick or weak to the triager  Answer Assessment - Initial Assessment Questions 1. DESCRIPTION: "Describe your dizziness."     I had a bad sore throat Sat.  I thought it would go away.   Saturday night my back hurt so bad.   Yesterday I started feeling light headed.   I'm having drainage down my throat.  No runny nose.   I'm have coughing spells.   Yesterday that was brownish green mucus I coughed up.       I went downstairs and let the dog out and I have a headache and dizziness now.   That's first time I've been downstairs since yesterday.   I've been in the bed.  I'm feeling so light headed.   I haven't eaten anything.   Covid test was negative yesterday.    Once I take something and lay down it helps.   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  Both of them tested negative too for Covid.      2. LIGHTHEADED: "Do you feel lightheaded?" (e.g., somewhat faint, woozy, weak upon standing)     Still light headed today.   But going down the stairs and back up was hard due to my balance.  I'm only going to the bathroom due to the dizziness.   I've been in bed since yesterday.    I've been dizzy like this before but they shut down for Covid.   I was to see an ENT dr. Runell Gess I saw but the balance tests were cancelled due to Covid.    He did check my hearing.   "I just felt off".    "I've never had an ear infection so I don't know how that feels". My right ear itches.  When I had the sore throat my right ear hurt..   I feel tender around my neck.   She denies feeling like she could pass out.   No shortness of breath or chest tightness.    My whole back hurts.  The pain is more on the right side of my back.  Denies frequency, burning or abd pain with urination. I had back pain when I had pneumonia 5 yrs ago.   It feels similar.   With pneumonia I started with a cold and coughing.   I don't feel like that now.   3. VERTIGO: "Do you feel  like either you or the room is spinning or tilting?" (i.e. vertigo)     Lightheaded dizzy.    Moving around makes it worse. 4. SEVERITY: "How bad is it?"  "Do you feel like you are going to faint?" "Can you stand and walk?"   - MILD: Feels slightly dizzy, but walking normally.   - MODERATE: Feels unsteady when walking, but not falling; interferes with normal activities (e.g., school, work).   - SEVERE: Unable to walk without falling, or requires assistance to walk without falling; feels like passing out now.      Severe.   Having to hold onto things to keep her balance.   My husband went to work this morning.   She is a lone.   She is drinking fluids.   She had diarrhea Saturday and a little yesterday.   I've not eaten much since Sat.   I had chills bad Saturday night then the chills went away.   The Tylenol stop the chills  I took some Aleve for the back ache.   My back is still hurting today.    5. ONSET:  "When did the dizziness begin?"     Saturday night early Sunday morning and still feeling dizzy 6. AGGRAVATING FACTORS: "Does anything make it worse?" (e.g., standing, change in head position)     Moving around.    I'm lightheaded.   I feel like a vibration in my head.   No headache just light headedness. 7. HEART RATE: "Can you tell me your heart rate?" "How many beats in 15 seconds?"  (Note: not all patients can do this)       Not asked 8. CAUSE: "What do you think is causing the dizziness?"     I think I have what the babies had I was babysitting.   I'm wondering if it's RSV.    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    9. RECURRENT SYMPTOM: "Have you had dizziness before?" If Yes, ask: "When was the last time?" "What happened that time?"     Yes see above  10. OTHER SYMPTOMS: "Do you have any other symptoms?" (e.g., fever, chest pain, vomiting, diarrhea, bleeding)       See above 11. PREGNANCY: "Is there any chance you are pregnant?" "When was your last menstrual period?"       N/A  due to age  Protocols used: Dizziness - Lightheadedness-A-AH

## 2021-12-07 ENCOUNTER — Other Ambulatory Visit: Payer: Self-pay | Admitting: Family Medicine

## 2021-12-07 DIAGNOSIS — I10 Essential (primary) hypertension: Secondary | ICD-10-CM

## 2021-12-07 DIAGNOSIS — G43109 Migraine with aura, not intractable, without status migrainosus: Secondary | ICD-10-CM

## 2021-12-14 ENCOUNTER — Other Ambulatory Visit: Payer: Self-pay | Admitting: Family Medicine

## 2021-12-14 DIAGNOSIS — K2930 Chronic superficial gastritis without bleeding: Secondary | ICD-10-CM

## 2021-12-20 ENCOUNTER — Other Ambulatory Visit: Payer: Self-pay | Admitting: Family Medicine

## 2021-12-20 NOTE — Progress Notes (Signed)
Name: Tammy Swanson   MRN: [id number]    DOB: [birth year]   Date:12/21/2021       Progress Note  Subjective  Chief Complaint  Follow Up  HPI  Depression Mild Recurrent: she is on Effexor, also going to a [facility] for therapy. Husband asked to separate from her in July, daughter admitted in October and diagnosed with Bipolar. 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 She also sees psychiatrist and    Headaches: she has a history of migraines as an young adult, she was doing well, however last Fall she noticed  symptoms returned. We tried Topamax and it was working but it caused brain fog and because of severe symptoms  she was referred to Dr. Melrose Nakayama, she is now off Topamax and taking Nortriptyline 50 mg every night , she is on  Effexor 150 mg  and Nadolol. Migraine is usually throbbing, severe, usually right side of head. She stopped Imitrex since it is not as efficacious and also does not like side effects.She states currently on Ajovy , she states the combination of all her medications now is working as long as she takes it as prescribed. She states intense migraine is not present but still has a dull ache behind right eye,  MRI brain negative   HTN: bp has been high for the past 2 months, she has been compliant with medication. She denies side effects of medication. She denies chest pain or  palpitation , but has intermittent SOB ( states asthma does not seem controlled)(   Pre-diabetes: no polyphagia, polydipsia or polyuria. We will recheck labs   Vitamin D and B12 deficiency: doing well on otc supplementation . We will recheck labs today   GERD/Chronic gastritis: she is feeling much better since taking Omeprazole  Asthma mild persistent  she states present in the Spring and Fall.  She states for the past month she was having increase in cough, productive but this week she is feeling better.She also has nocturnal wheezing and some  SOB with activity   Right ear pain: she was [facility] diagnosed with eustachian tube dysfunction, she also saw neurologist - Dr Melrose Nakayama and was given a round of prednisone but continues to have right ear pain , intermittent vertigo - that has improved., she also took Cefdnir given by Urgent Care the second time she was evaluated  No fever or chills.   Obesity Morbid: BMI above 35  with co-morbidities such as GERD, HTN and pre-diabetes , discusses life style modification also   Patient Active Problem List   Diagnosis Date Noted   Polyp of transverse colon    Acute gastritis without hemorrhage    Transient confusion 05/25/2020   Phonophobia 03/06/2020   Photophobia 03/06/2020   Headache disorder 10/22/2019   Sleep disorder 10/22/2019   History of carpal tunnel surgery of right wrist 12/06/2016   Prediabetes 10/15/2016   History of liver failure 08/24/2016   Migraine without aura and without status migrainosus, not intractable 09/10/2015   Eczema 09/10/2015   Bad memory 06/01/2015   Menopause 06/01/2015   Cough variant asthma 06/01/2015   Obesity (BMI 30-39.9) 06/01/2015   Nodule, subcutaneous 06/01/2015   Lesion of ulnar nerve 06/01/2015   Vitiligo 06/01/2015   Benign hypertension 09/03/2010    Past Surgical History:  Procedure Laterality Date   ABDOMINAL EXPLORATION SURGERY     x3   ABDOMINAL HYSTERECTOMY  2013   ABDOMINAL HYSTERECTOMY  2014   CARPAL TUNNEL RELEASE Right 12/06/2016   Procedure: CARPAL TUNNEL RELEASE;  Surgeon: Hessie Knows, MD;  Location: [facility];  Service: Orthopedics;  Laterality: Right;   CHOLECYSTECTOMY     COLONOSCOPY WITH PROPOFOL N/A 07/19/2021   Procedure: COLONOSCOPY WITH BIOPSY;  Surgeon: Lucilla Lame, MD;  Location: [place];  Service: Endoscopy;  Laterality: N/A;   ESOPHAGOGASTRODUODENOSCOPY (EGD) WITH PROPOFOL N/A 07/19/2021   Procedure: ESOPHAGOGASTRODUODENOSCOPY (EGD) WITH BIOPSY;  Surgeon: Lucilla Lame, MD;  Location: [place];  Service: Endoscopy;  Laterality: N/A;  Latex   FOOT SURGERY     FOOT SURGERY Left 2007/2009   Dr. Elvina Mattes   KNEE ARTHROSCOPY Left 1985   KNEE SURGERY Left    MANDIBLE FRACTURE SURGERY     NASAL SINUS SURGERY     NASAL SINUS SURGERY  2012   POLYPECTOMY N/A 07/19/2021   Procedure: POLYPECTOMY;  Surgeon: Lucilla Lame, MD;  Location: [place];  Service: Endoscopy;  Laterality: N/A;   RECONSTRUCTION MANDIBLE / MAXILLA  1992   TONSILLECTOMY     TONSILLECTOMY  1981   TRIGGER FINGER RELEASE      Family History  Problem Relation Age of Onset   Hypertension Mother    Cancer Mother        Breast, colon   HIV/AIDS Mother    Breast cancer Mother    Cancer Father        lung cancer   Hypertension Sister    Hypertension Maternal Grandmother    Hypertension Maternal Grandfather    Cancer Maternal Aunt        3 sisters with Breast Cancer   Breast cancer Maternal Aunt    Breast cancer Maternal Aunt    Cancer Paternal Aunt    Cancer Paternal Uncle     Social History   Tobacco Use   Smoking status: Never   Smokeless tobacco: Never  Substance Use Topics   Alcohol use: Yes    Comment: occasionally     Current Outpatient Medications:    albuterol (VENTOLIN HFA) 108 (90 Base) MCG/ACT inhaler, TAKE 2 PUFFS BY MOUTH EVERY 6 HOURS AS NEEDED FOR WHEEZE OR SHORTNESS OF BREATH, Disp: 18 each, Rfl: 0   budesonide-formoterol (SYMBICORT) 80-4.5 MCG/ACT inhaler, Inhale 2 puffs into the lungs in the morning and at bedtime., Disp: 10.2 each, Rfl: 2   cholecalciferol (VITAMIN D) 1000 units tablet, Take 1,000 Units by mouth daily., Disp: , Rfl:    Cyanocobalamin (B-12) 1000 MCG SUBL, Place 1 tablet under the tongue daily., Disp: , Rfl:    estradiol (VIVELLE-DOT) 0.05 MG/24HR patch, APPLY 1 PATCH TWICE WEEKLY, Disp: 24 patch, Rfl: 0   losartan-hydrochlorothiazide (HYZAAR) 50-12.5 MG tablet, TAKE 1 TABLET BY MOUTH EVERY DAY, Disp: 90 tablet, Rfl: 0   MAGNESIUM PO, Take by  mouth., Disp: , Rfl:    nadolol (CORGARD) 20 MG tablet, TAKE 1 TABLET (20 MG TOTAL) BY MOUTH DAILY. IN PLACE OF ATENOLOL, Disp: 90 tablet, Rfl: 0   nortriptyline (PAMELOR) 50 MG capsule, Take 50 mg by mouth at bedtime., Disp: , Rfl:    omeprazole (PRILOSEC) 20 MG capsule, TAKE 1 CAPSULE BY MOUTH DAILY BEFORE BREAKFAST., Disp: 90 capsule, Rfl: 0   Sod Picosulfate-Mag Ox-Cit Acd (CLENPIQ) 10-3.5-12 MG-GM -GM/160ML SOLN, Take 320 mLs by mouth as directed., Disp: 320 mL, Rfl: 0   venlafaxine XR (EFFEXOR-XR) 150 MG 24 hr capsule, Take 1 capsule (150 mg total) by mouth daily  with breakfast., Disp: 90 capsule, Rfl: 1  Allergies  Allergen Reactions   Latex Anaphylaxis   Belviq [Lorcaserin Hcl]     Dry mouth, palpitation   Tomato Hives    "Cherry tomatoes"   Vicodin [Hydrocodone-Acetaminophen] Hives   Imitrex [Sumatriptan] Palpitations    Intolerance     I personally reviewed active problem list, medication list, allergies, family history, social history, health maintenance with the patient/caregiver today.   ROS  Constitutional: Negative for fever or weight change.  Respiratory: Negative for cough and shortness of breath.   Cardiovascular: Negative for chest pain or palpitations.  Gastrointestinal: Negative for abdominal pain, no bowel changes.  Musculoskeletal: Negative for gait problem or joint swelling.  Skin: Negative for rash.  Neurological: Negative for dizziness or headache.  No other specific complaints in a complete review of systems (except as listed in HPI above).   Objective  Vitals:   12/21/21 1515  BP: (!) 148/100  Pulse: 89  Resp: 16  Temp: 98.2 F (36.8 C)  TempSrc: Oral  SpO2: 97%  Weight: 200 lb 4.8 oz (90.9 kg)  Height: 5\' 2"  (1.575 m)    Body mass index is 36.64 kg/m.  Physical Exam  Constitutional: Patient appears well-developed and well-nourished. Obese  No distress.  HEENT: head atraumatic, normocephalic, pupils equal and reactive to light, neck  supple Cardiovascular: Normal rate, regular rhythm and normal heart sounds.  No murmur heard. No BLE edema. Pulmonary/Chest: Effort normal and breath sounds normal. No respiratory distress. Abdominal: Soft.  There is no tenderness. Psychiatric: Patient has a normal mood and affect. behavior is normal. Judgment and thought content normal.   PHQ2/9: Depression screen [facility] 2/9 12/21/2021 09/15/2021 06/21/2021 03/16/2021 08/26/2020  Decreased Interest 1 1 1  0 0  Down, Depressed, Hopeless 1 2 1 1  0  PHQ - 2 Score 2 3 2 1  0  Altered sleeping 1 0 0 1 0  Tired, decreased energy 2 1 0 0 2  Change in appetite 1 0 0 3 1  Feeling bad or failure about yourself  1 0 2 0 0  Trouble concentrating 1 2 3 3 3   Moving slowly or fidgety/restless 0 0 0 0 0  Suicidal thoughts 0 0 0 0 0  PHQ-9 Score 8 6 7 8 6   Difficult doing work/chores Not difficult at all - - - Somewhat difficult  Some recent data might be hidden    phq 9 is positive   Fall Risk: Fall Risk  12/21/2021 09/15/2021 06/21/2021 03/16/2021 08/26/2020  Falls in the past year? 1 1 0 0 0  Number falls in past yr: 1 0 0 0 0  Injury with Fall? 0 1 0 0 0  Comment - - - - -  Risk for fall due to : History of fall(s) No Fall Risks - - -  Follow up Falls evaluation completed;Education provided;Falls prevention discussed Falls prevention discussed - - -      Functional Status Survey: Is the patient deaf or have difficulty hearing?: Yes Does the patient have difficulty seeing, even when wearing glasses/contacts?: No Does the patient have difficulty concentrating, remembering, or making decisions?: Yes Does the patient have difficulty walking or climbing stairs?: No Does the patient have difficulty dressing or bathing?: No Does the patient have difficulty doing errands alone such as visiting a doctor's office or shopping?: No    Assessment & Plan  1. Moderate episode of recurrent major depressive disorder (HCC)  Struggling, states missing her mother,  separated  from husband, worries about her children   2. B12 deficiency  - Vitamin B12  3. Morbid obesity ([place])  Discussed with the patient the risk posed by an increased BMI. Discussed importance of portion control, calorie counting and at least 150 minutes of physical activity weekly. Avoid sweet beverages and drink more water. Eat at least 6 servings of fruit and vegetables daily    4. Essential hypertension  - CBC with Differential/Platelet - Comprehensive metabolic panel - TSH - losartan-hydrochlorothiazide (HYZAAR) 100-12.5 MG tablet; Take 1 tablet by mouth daily.  Dispense: 90 tablet; Refill: 0 - nadolol (CORGARD) 20 MG tablet; Take 1 tablet (20 mg total) by mouth daily. In place of Atenolol  Dispense: 90 tablet; Refill: 0  5. Breast cancer screening by mammogram  - MM Digital Screening; Future  6. Migraine with aura and without status migrainosus, not intractable  - nadolol (CORGARD) 20 MG tablet; Take 1 tablet (20 mg total) by mouth daily. In place of Atenolol  Dispense: 90 tablet; Refill: 0  7. Mild persistent asthma without complication  - montelukast (SINGULAIR) 10 MG tablet; Take 1 tablet (10 mg total) by mouth at bedtime.  Dispense: 90 tablet; Refill: 1  8. Prediabetes  - Hemoglobin A1c  9. Vitamin D deficiency  - VITAMIN D 25 Hydroxy (Vit-D Deficiency, Fractures)  10. Lipid screening  - Lipid panel   11. Right ear pain  She wears a mouth guard, explained pain is over TMJ, ear exam normal, advised to contact dentist

## 2021-12-21 ENCOUNTER — Encounter: Payer: Self-pay | Admitting: Family Medicine

## 2021-12-21 ENCOUNTER — Ambulatory Visit: Payer: Commercial Managed Care - PPO | Admitting: Family Medicine

## 2021-12-21 VITALS — BP 150/90 | HR 89 | Temp 98.2°F | Resp 16 | Ht 62.0 in | Wt 200.3 lb

## 2021-12-21 DIAGNOSIS — E538 Deficiency of other specified B group vitamins: Secondary | ICD-10-CM | POA: Diagnosis not present

## 2021-12-21 DIAGNOSIS — F331 Major depressive disorder, recurrent, moderate: Secondary | ICD-10-CM

## 2021-12-21 DIAGNOSIS — Z13220 Encounter for screening for lipoid disorders: Secondary | ICD-10-CM

## 2021-12-21 DIAGNOSIS — J4530 Mild persistent asthma, uncomplicated: Secondary | ICD-10-CM

## 2021-12-21 DIAGNOSIS — I10 Essential (primary) hypertension: Secondary | ICD-10-CM

## 2021-12-21 DIAGNOSIS — R7303 Prediabetes: Secondary | ICD-10-CM

## 2021-12-21 DIAGNOSIS — E559 Vitamin D deficiency, unspecified: Secondary | ICD-10-CM

## 2021-12-21 DIAGNOSIS — H9201 Otalgia, right ear: Secondary | ICD-10-CM

## 2021-12-21 DIAGNOSIS — G43109 Migraine with aura, not intractable, without status migrainosus: Secondary | ICD-10-CM

## 2021-12-21 DIAGNOSIS — Z1231 Encounter for screening mammogram for malignant neoplasm of breast: Secondary | ICD-10-CM

## 2021-12-21 MED ORDER — MONTELUKAST SODIUM 10 MG PO TABS: 10.0000 mg | ORAL_TABLET | Freq: Every day | ORAL | 1 refills | Status: DC

## 2021-12-21 MED ORDER — NADOLOL 20 MG PO TABS: 20.0000 mg | ORAL_TABLET | Freq: Every day | ORAL | 0 refills | Status: DC

## 2021-12-21 MED ORDER — LOSARTAN POTASSIUM-HCTZ 100-12.5 MG PO TABS
1.0000 | ORAL_TABLET | Freq: Every day | ORAL | 0 refills | Status: DC
Start: 2021-12-21 — End: 2022-08-23

## 2021-12-23 LAB — CBC WITH DIFFERENTIAL/PLATELET
Basophils Absolute: 0.1 10*3/uL (ref 0.0–0.2)
Basos: 1 %
EOS (ABSOLUTE): 0.2 10*3/uL (ref 0.0–0.4)
Eos: 2 %
Hematocrit: 44.7 % (ref 34.0–46.6)
Hemoglobin: 14.5 g/dL (ref 11.1–15.9)
Immature Grans (Abs): 0 10*3/uL (ref 0.0–0.1)
Immature Granulocytes: 0 %
Lymphocytes Absolute: 2.3 10*3/uL (ref 0.7–3.1)
Lymphs: 23 %
MCH: 27.9 pg (ref 26.6–33.0)
MCHC: 32.4 g/dL (ref 31.5–35.7)
MCV: 86 fL (ref 79–97)
Monocytes Absolute: 0.6 10*3/uL (ref 0.1–0.9)
Monocytes: 6 %
Neutrophils Absolute: 6.6 10*3/uL (ref 1.4–7.0)
Neutrophils: 68 %
Platelets: 416 10*3/uL (ref 150–450)
RBC: 5.19 x10E6/uL (ref 3.77–5.28)
RDW: 13.4 % (ref 11.7–15.4)
WBC: 9.7 10*3/uL (ref 3.4–10.8)

## 2021-12-23 LAB — LIPID PANEL
Chol/HDL Ratio: 3.1 ratio (ref 0.0–4.4)
Cholesterol, Total: 196 mg/dL (ref 100–199)
HDL: 63 mg/dL (ref >39)
LDL Chol Calc (NIH): 105 mg/dL — ABNORMAL HIGH (ref 0–99)
Triglycerides: 163 mg/dL — ABNORMAL HIGH (ref 0–149)
VLDL Cholesterol Cal: 28 mg/dL (ref 5–40)

## 2021-12-23 LAB — HEMOGLOBIN A1C
Est. average glucose Bld gHb Est-mCnc: 128 mg/dL
Hgb A1c MFr Bld: 6.1 % — ABNORMAL HIGH (ref 4.8–5.6)

## 2021-12-23 LAB — COMPREHENSIVE METABOLIC PANEL
ALT: 14 IU/L (ref 0–32)
AST: 22 IU/L (ref 0–40)
Albumin/Globulin Ratio: 1.2 (ref 1.2–2.2)
Albumin: 4.2 g/dL (ref 3.8–4.9)
Alkaline Phosphatase: 109 IU/L (ref 44–121)
BUN/Creatinine Ratio: 12 (ref 9–23)
BUN: 10 mg/dL (ref 6–24)
Bilirubin Total: 0.3 mg/dL (ref 0.0–1.2)
CO2: 25 mmol/L (ref 20–29)
Calcium: 10.3 mg/dL — ABNORMAL HIGH (ref 8.7–10.2)
Chloride: 101 mmol/L (ref 96–106)
Creatinine, Ser: 0.86 mg/dL (ref 0.57–1.00)
Globulin, Total: 3.6 g/dL (ref 1.5–4.5)
Glucose: 90 mg/dL (ref 70–99)
Potassium: 4.6 mmol/L (ref 3.5–5.2)
Sodium: 142 mmol/L (ref 134–144)
Total Protein: 7.8 g/dL (ref 6.0–8.5)
eGFR: 79 mL/min/{1.73_m2} (ref >59)

## 2021-12-23 LAB — TSH: TSH: 1.64 u[IU]/mL (ref 0.450–4.500)

## 2021-12-23 LAB — VITAMIN B12: Vitamin B-12: 786 pg/mL (ref 232–1245)

## 2021-12-23 LAB — VITAMIN D 25 HYDROXY (VIT D DEFICIENCY, FRACTURES): Vit D, 25-Hydroxy: 32.6 ng/mL (ref 30.0–100.0)

## 2022-03-11 ENCOUNTER — Other Ambulatory Visit: Payer: Self-pay | Admitting: Family Medicine

## 2022-03-12 ENCOUNTER — Other Ambulatory Visit: Payer: Self-pay | Admitting: Family Medicine

## 2022-03-12 DIAGNOSIS — I10 Essential (primary) hypertension: Secondary | ICD-10-CM

## 2022-03-13 ENCOUNTER — Other Ambulatory Visit: Payer: Self-pay | Admitting: Internal Medicine

## 2022-03-13 DIAGNOSIS — K2930 Chronic superficial gastritis without bleeding: Secondary | ICD-10-CM

## 2022-03-15 NOTE — Telephone Encounter (Signed)
Requested Prescriptions  ?Pending Prescriptions Disp Refills  ?? omeprazole (PRILOSEC) 20 MG capsule [Pharmacy Med Name: OMEPRAZOLE DR 20 MG CAPSULE] 90 capsule 0  ?  Sig: TAKE 1 CAPSULE BY MOUTH EVERY DAY BEFORE BREAKFAST  ?  ? Gastroenterology: Proton Pump Inhibitors Passed - 03/13/2022  9:14 AM  ?  ?  Passed - Valid encounter within last 12 months  ?  Recent Outpatient Visits   ?      ? 2 months ago Moderate episode of recurrent major depressive disorder ([place])  ? [facility] [place], Drue Stager, MD  ? 6 months ago Morbid obesity [facility])  ? [facility] [place], Drue Stager, MD  ? 8 months ago Mild major depression [facility])  ? [facility] Steele Sizer, MD  ? 12 months ago Essential hypertension  ? [facility] Steele Sizer, MD  ? 1 year ago Flu vaccine need  ? [facility] Steele Sizer, MD  ?  ?  ?Future Appointments   ?        ? In 3 weeks Steele Sizer, MD [facility] [place]  ?  ? ?  ?  ?  ? ? ?

## 2022-03-30 ENCOUNTER — Ambulatory Visit: Payer: Commercial Managed Care - PPO | Admitting: Family Medicine

## 2022-04-04 NOTE — Progress Notes (Signed)
Name: Tammy Swanson   MRN: [id number]    DOB: 1966-11-15   Date:04/05/2022 ? ?     Progress Note ? ?Subjective ? ?Chief Complaint ? ?Follow Up ? ?HPI ? ?Depression Mild Recurrent: she is  going to a [facility] for therapy. Husband asked to separate from her in July 22- he told her he was cheating on her ,  daughter admitted in October and diagnosed with Bipolar. 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 She is now separated from her husband and is struggling financially and having to stay with her sister . She is still taking Effexor and also now on Rexulti . She states she felt suicidal last week, she felt like taking pills, she decided not to take the pills because of her daughter that talked to her.  ?  ?Headaches: she has a long  history of migraines as an young adult, she was doing well, however last Fall 22 she noticed  symptoms returned. We tried Topamax and it was working but it caused brain fog and because of severe symptoms  she was referred to Dr. Melrose Nakayama, she is now off Topamax and taking Nortriptyline 50 mg every night , she is also   Effexor 150 mg ,  Nadolol. And Nortriptyline . Migraine is usually throbbing, severe, usually right side of head. She stopped Imitrex since it is not as efficacious and also does not like side effects. She was on Ajovy but she has been out ,advised her to contact Dr. Melrose Nakayama . She is having hyperalgesia on left side also . She is also having recurrent ear pain  ? ?Right otalgia: going on for months, neurologist said she has air fluid level but on exam today it was normal ? ?HTN: bp is at goal. She denies side effects of medication. She denies chest pain or  palpitation.She has intermittent SOB due to asthma  ? ?Pre-diabetes: no polyphagia, polydipsia or polyuria. Last A1C was 6.1 %  ? ?Vitamin D and B12 deficiency: doing well on otc supplementation  Reviewed labs with patient  ? ?Hypercalcemia: we will recheck next  visit and add parathyroid  ? ?GERD/Chronic gastritis: she is feeling much better since taking Omeprazole, unchanged  ? ?Asthma moderate persistent  she states present in the Spring and Fall.  She has wheezing and SOB , no cough.  ? ?Obesity Morbid: BMI above 35  with co-morbidities such as GERD, HTN and pre-diabetes , weight has gone up 5 lbs since last visit She is a stress eater.  ? ?Patient Active Problem List  ? Diagnosis Date Noted  ? Polyp of transverse colon   ? Acute gastritis without hemorrhage   ? Transient confusion 05/25/2020  ? Headache disorder 10/22/2019  ? Sleep disorder 10/22/2019  ? History of carpal tunnel surgery of right wrist 12/06/2016  ? Prediabetes 10/15/2016  ? History of liver failure 08/24/2016  ? Migraine without aura and without status migrainosus, not intractable 09/10/2015  ? Eczema 09/10/2015  ? Bad memory 06/01/2015  ? Menopause 06/01/2015  ? Cough variant asthma 06/01/2015  ? Obesity (BMI 30-39.9) 06/01/2015  ? Nodule, subcutaneous 06/01/2015  ? Lesion of ulnar nerve 06/01/2015  ? Vitiligo 06/01/2015  ? Benign hypertension 09/03/2010  ? ? ?Past Surgical History:  ?Procedure Laterality Date  ? ABDOMINAL EXPLORATION SURGERY    ? x3  ? ABDOMINAL HYSTERECTOMY  2013  ? ABDOMINAL HYSTERECTOMY  2014  ? CARPAL  TUNNEL RELEASE Right 12/06/2016  ? Procedure: CARPAL TUNNEL RELEASE;  Surgeon: Hessie Knows, MD;  Location: [facility];  Service: Orthopedics;  Laterality: Right;  ? CHOLECYSTECTOMY    ? COLONOSCOPY WITH PROPOFOL N/A 07/19/2021  ? Procedure: COLONOSCOPY WITH BIOPSY;  Surgeon: Lucilla Lame, MD;  Location: [place];  Service: Endoscopy;  Laterality: N/A;  ? ESOPHAGOGASTRODUODENOSCOPY (EGD) WITH PROPOFOL N/A 07/19/2021  ? Procedure: ESOPHAGOGASTRODUODENOSCOPY (EGD) WITH BIOPSY;  Surgeon: Lucilla Lame, MD;  Location: [place];  Service: Endoscopy;  Laterality: N/A;  Latex  ? FOOT SURGERY    ? FOOT SURGERY Left 2007/2009  ? Dr. Elvina Mattes  ? KNEE ARTHROSCOPY Left 1985  ? KNEE  SURGERY Left   ? MANDIBLE FRACTURE SURGERY    ? NASAL SINUS SURGERY    ? NASAL SINUS SURGERY  2012  ? POLYPECTOMY N/A 07/19/2021  ? Procedure: POLYPECTOMY;  Surgeon: Lucilla Lame, MD;  Location: [place];  Service: Endoscopy;  Laterality: N/A;  ? RECONSTRUCTION MANDIBLE / MAXILLA  1992  ? TONSILLECTOMY    ? TONSILLECTOMY  1981  ? TRIGGER FINGER RELEASE    ? ? ?Family History  ?Problem Relation Age of Onset  ? Hypertension Mother   ? Cancer Mother   ?     Breast, colon  ? HIV/AIDS Mother   ? Breast cancer Mother   ? Cancer Father   ?     lung cancer  ? Hypertension Sister   ? Hypertension Maternal Grandmother   ? Hypertension Maternal Grandfather   ? Bipolar disorder Daughter   ? Cancer Maternal Aunt   ?     3 sisters with Breast Cancer  ? Breast cancer Maternal Aunt   ? Breast cancer Maternal Aunt   ? Cancer Paternal Aunt   ? Cancer Paternal Uncle   ? ? ?Social History  ? ?Tobacco Use  ? Smoking status: Never  ? Smokeless tobacco: Never  ?Substance Use Topics  ? Alcohol use: Yes  ?  Comment: occasionally  ? ? ? ?Current Outpatient Medications:  ?  albuterol (VENTOLIN HFA) 108 (90 Base) MCG/ACT inhaler, TAKE 2 PUFFS BY MOUTH EVERY 6 HOURS AS NEEDED FOR WHEEZE OR SHORTNESS OF BREATH, Disp: 18 each, Rfl: 0 ?  brexpiprazole (REXULTI) 2 MG TABS tablet, Take 2 mg by mouth daily., Disp: , Rfl:  ?  cholecalciferol (VITAMIN D) 1000 units tablet, Take 1,000 Units by mouth daily., Disp: , Rfl:  ?  Cyanocobalamin (B-12) 1000 MCG SUBL, Place 1 tablet under the tongue daily., Disp: , Rfl:  ?  estradiol (VIVELLE-DOT) 0.05 MG/24HR patch, APPLY 1 PATCH TWICE WEEKLY, Disp: 24 patch, Rfl: 0 ?  Fluticasone-Umeclidin-Vilant (TRELEGY ELLIPTA) 100-62.5-25 MCG/ACT AEPB, Inhale 1 puff into the lungs daily., Disp: 3 each, Rfl: 1 ?  losartan-hydrochlorothiazide (HYZAAR) 100-12.5 MG tablet, Take 1 tablet by mouth daily., Disp: 90 tablet, Rfl: 0 ?  MAGNESIUM PO, Take by mouth., Disp: , Rfl:  ?  montelukast (SINGULAIR) 10 MG tablet,  Take 1 tablet (10 mg total) by mouth at bedtime., Disp: 90 tablet, Rfl: 1 ?  nortriptyline (PAMELOR) 50 MG capsule, Take 50 mg by mouth at bedtime., Disp: , Rfl:  ?  omeprazole (PRILOSEC) 20 MG capsule, TAKE 1 CAPSULE BY MOUTH EVERY DAY BEFORE BREAKFAST, Disp: 90 capsule, Rfl: 0 ?  venlafaxine XR (EFFEXOR-XR) 150 MG 24 hr capsule, Take 1 capsule (150 mg total) by mouth daily with breakfast., Disp: 90 capsule, Rfl: 1 ?  nadolol (CORGARD) 20 MG tablet, Take 1  tablet (20 mg total) by mouth daily. In place of Atenolol, Disp: 90 tablet, Rfl: 0 ? ?Allergies  ?Allergen Reactions  ? Latex Anaphylaxis  ? Belviq [Lorcaserin Hcl]   ?  Dry mouth, palpitation  ? Tomato Hives  ?  "Cherry tomatoes"  ? Vicodin [Hydrocodone-Acetaminophen] Hives  ? Imitrex [Sumatriptan] Palpitations  ?  Intolerance   ? ? ?I personally reviewed active problem list, medication list, allergies, family history, social history, health maintenance with the patient/caregiver today. ? ? ?ROS ? ?Constitutional: Negative for fever or weight change.  ?Respiratory: Negative for cough, positive for  shortness of breath.   ?Cardiovascular: Negative for chest pain or palpitations.  ?Gastrointestinal: Negative for abdominal pain, no bowel changes.  ?Musculoskeletal: Negative for gait problem or joint swelling.  ?Skin: Negative for rash.  ?Neurological: Negative for dizziness or headache.  ?No other specific complaints in a complete review of systems (except as listed in HPI above).  ? ?Objective ? ?Vitals:  ? 04/05/22 1409  ?BP: 136/84  ?Pulse: 85  ?Resp: 16  ?SpO2: 100%  ?Weight: 205 lb (93 kg)  ?Height: '5\' 2"'$  (1.575 m)  ? ? ?Body mass index is 37.49 kg/m?. ? ?Physical Exam ? ?Constitutional: Patient appears well-developed and well-nourished. Obese  No distress.  ?HEENT: head atraumatic, normocephalic, pupils equal and reactive to light, neck supple ?Cardiovascular: Normal rate, regular rhythm and normal heart sounds.  No murmur heard. No BLE  edema. ?Pulmonary/Chest: Effort normal and breath sounds normal. No respiratory distress. ?Abdominal: Soft.  There is no tenderness. ?Psychiatric: Patient has a normal mood and affect. behavior is normal. Judgment and thought co

## 2022-04-05 ENCOUNTER — Ambulatory Visit: Payer: Commercial Managed Care - PPO | Admitting: Family Medicine

## 2022-04-05 ENCOUNTER — Encounter: Payer: Self-pay | Admitting: Family Medicine

## 2022-04-05 DIAGNOSIS — H9201 Otalgia, right ear: Secondary | ICD-10-CM

## 2022-04-05 DIAGNOSIS — J4540 Moderate persistent asthma, uncomplicated: Secondary | ICD-10-CM

## 2022-04-05 DIAGNOSIS — E559 Vitamin D deficiency, unspecified: Secondary | ICD-10-CM

## 2022-04-05 DIAGNOSIS — G43109 Migraine with aura, not intractable, without status migrainosus: Secondary | ICD-10-CM

## 2022-04-05 DIAGNOSIS — I10 Essential (primary) hypertension: Secondary | ICD-10-CM | POA: Diagnosis not present

## 2022-04-05 DIAGNOSIS — E538 Deficiency of other specified B group vitamins: Secondary | ICD-10-CM

## 2022-04-05 DIAGNOSIS — J45991 Cough variant asthma: Secondary | ICD-10-CM | POA: Diagnosis not present

## 2022-04-05 MED ORDER — TRELEGY ELLIPTA 100-62.5-25 MCG/ACT IN AEPB: 1.0000 | INHALATION_SPRAY | Freq: Every day | RESPIRATORY_TRACT | 1 refills | Status: DC

## 2022-04-05 MED ORDER — NADOLOL 20 MG PO TABS: 20.0000 mg | ORAL_TABLET | Freq: Every day | ORAL | 0 refills | Status: DC

## 2022-05-02 LAB — HM PAP SMEAR: HM Pap smear: NORMAL

## 2022-06-06 ENCOUNTER — Other Ambulatory Visit: Payer: Self-pay

## 2022-06-06 ENCOUNTER — Telehealth: Payer: Self-pay | Admitting: Family Medicine

## 2022-06-06 ENCOUNTER — Other Ambulatory Visit: Payer: Self-pay | Admitting: Family Medicine

## 2022-06-06 MED ORDER — ESTRADIOL 0.05 MG/24HR TD PTTW
MEDICATED_PATCH | TRANSDERMAL | 0 refills | Status: AC
Start: 2022-06-06 — End: ?

## 2022-06-24 ENCOUNTER — Other Ambulatory Visit: Payer: Self-pay | Admitting: Family Medicine

## 2022-06-24 DIAGNOSIS — J4530 Mild persistent asthma, uncomplicated: Secondary | ICD-10-CM

## 2022-08-04 NOTE — Progress Notes (Deleted)
Name: Tammy Swanson   MRN: [id number]    DOB: 1966/10/29   Date:08/04/2022       Progress Note  Subjective  Chief Complaint  Follow Up  HPI  Depression Mild Recurrent: she is  going to a [facility] for therapy. Husband asked to separate from her in July 22- he told her he was cheating on her ,  daughter admitted in October and diagnosed with Bipolar. 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 She is now separated from her husband and is struggling financially and having to stay with her sister . She is still taking Effexor and also now on Rexulti . She states she felt suicidal last week, she felt like taking pills, she decided not to take the pills because of her daughter that talked to her.    Headaches: she has a long  history of migraines as an young adult, she was doing well, however last Fall 22 she noticed  symptoms returned. We tried Topamax and it was working but it caused brain fog and because of severe symptoms  she was referred to Dr. Melrose Nakayama, she is now off Topamax and taking Nortriptyline 50 mg every night , she is also   Effexor 150 mg ,  Nadolol. And Nortriptyline . Migraine is usually throbbing, severe, usually right side of head. She stopped Imitrex since it is not as efficacious and also does not like side effects. She was on Ajovy but she has been out ,advised her to contact Dr. Melrose Nakayama . She is having hyperalgesia on left side also . She is also having recurrent ear pain   Right otalgia: going on for months, neurologist said she has air fluid level but on exam today it was normal  HTN: bp is at goal. She denies side effects of medication. She denies chest pain or  palpitation.She has intermittent SOB due to asthma   Pre-diabetes: no polyphagia, polydipsia or polyuria. Last A1C was 6.1 %   Vitamin D and B12 deficiency: doing well on otc supplementation  Reviewed labs with patient   Hypercalcemia: we will recheck next  visit and add parathyroid   GERD/Chronic gastritis: she is feeling much better since taking Omeprazole, unchanged   Asthma moderate persistent  she states present in the Spring and Fall.  She has wheezing and SOB , no cough.   Obesity Morbid: BMI above 35  with co-morbidities such as GERD, HTN and pre-diabetes , weight has gone up 5 lbs since last visit She is a stress eater.   Patient Active Problem List   Diagnosis Date Noted   Polyp of transverse colon    Acute gastritis without hemorrhage    Transient confusion 05/25/2020   Headache disorder 10/22/2019   Sleep disorder 10/22/2019   History of carpal tunnel surgery of right wrist 12/06/2016   Prediabetes 10/15/2016   History of liver failure 08/24/2016   Migraine without aura and without status migrainosus, not intractable 09/10/2015   Eczema 09/10/2015   Bad memory 06/01/2015   Menopause 06/01/2015   Cough variant asthma 06/01/2015   Obesity (BMI 30-39.9) 06/01/2015   Nodule, subcutaneous 06/01/2015   Lesion of ulnar nerve 06/01/2015   Vitiligo 06/01/2015   Benign hypertension 09/03/2010    Past Surgical History:  Procedure Laterality Date   ABDOMINAL EXPLORATION SURGERY     x3   ABDOMINAL HYSTERECTOMY  2013   ABDOMINAL HYSTERECTOMY  2014   CARPAL  TUNNEL RELEASE Right 12/06/2016   Procedure: CARPAL TUNNEL RELEASE;  Surgeon: Hessie Knows, MD;  Location: [facility];  Service: Orthopedics;  Laterality: Right;   CHOLECYSTECTOMY     COLONOSCOPY WITH PROPOFOL N/A 07/19/2021   Procedure: COLONOSCOPY WITH BIOPSY;  Surgeon: Lucilla Lame, MD;  Location: [place];  Service: Endoscopy;  Laterality: N/A;   ESOPHAGOGASTRODUODENOSCOPY (EGD) WITH PROPOFOL N/A 07/19/2021   Procedure: ESOPHAGOGASTRODUODENOSCOPY (EGD) WITH BIOPSY;  Surgeon: Lucilla Lame, MD;  Location: [place];  Service: Endoscopy;  Laterality: N/A;  Latex   FOOT SURGERY     FOOT SURGERY Left 2007/2009   Dr. Elvina Mattes   KNEE ARTHROSCOPY Left 1985   KNEE  SURGERY Left    MANDIBLE FRACTURE SURGERY     NASAL SINUS SURGERY     NASAL SINUS SURGERY  2012   POLYPECTOMY N/A 07/19/2021   Procedure: POLYPECTOMY;  Surgeon: Lucilla Lame, MD;  Location: [place];  Service: Endoscopy;  Laterality: N/A;   RECONSTRUCTION MANDIBLE / MAXILLA  1992   TONSILLECTOMY     TONSILLECTOMY  1981   TRIGGER FINGER RELEASE      Family History  Problem Relation Age of Onset   Hypertension Mother    Cancer Mother        Breast, colon   HIV/AIDS Mother    Breast cancer Mother    Cancer Father        lung cancer   Hypertension Sister    Hypertension Maternal Grandmother    Hypertension Maternal Grandfather    Bipolar disorder Daughter    Cancer Maternal Aunt        3 sisters with Breast Cancer   Breast cancer Maternal Aunt    Breast cancer Maternal Aunt    Cancer Paternal Aunt    Cancer Paternal Uncle     Social History   Tobacco Use   Smoking status: Never   Smokeless tobacco: Never  Substance Use Topics   Alcohol use: Yes    Comment: occasionally     Current Outpatient Medications:    albuterol (VENTOLIN HFA) 108 (90 Base) MCG/ACT inhaler, TAKE 2 PUFFS BY MOUTH EVERY 6 HOURS AS NEEDED FOR WHEEZE OR SHORTNESS OF BREATH, Disp: 18 each, Rfl: 0   brexpiprazole (REXULTI) 2 MG TABS tablet, Take 2 mg by mouth daily., Disp: , Rfl:    cholecalciferol (VITAMIN D) 1000 units tablet, Take 1,000 Units by mouth daily., Disp: , Rfl:    Cyanocobalamin (B-12) 1000 MCG SUBL, Place 1 tablet under the tongue daily., Disp: , Rfl:    estradiol (VIVELLE-DOT) 0.05 MG/24HR patch, APPLY 1 PATCH TWICE WEEKLY, Disp: 24 patch, Rfl: 0   Fluticasone-Umeclidin-Vilant (TRELEGY ELLIPTA) 100-62.5-25 MCG/ACT AEPB, Inhale 1 puff into the lungs daily., Disp: 3 each, Rfl: 1   losartan-hydrochlorothiazide (HYZAAR) 100-12.5 MG tablet, Take 1 tablet by mouth daily., Disp: 90 tablet, Rfl: 0   MAGNESIUM PO, Take by mouth., Disp: , Rfl:    montelukast (SINGULAIR) 10 MG tablet,  TAKE 1 TABLET BY MOUTH EVERYDAY AT BEDTIME, Disp: 90 tablet, Rfl: 1   nadolol (CORGARD) 20 MG tablet, Take 1 tablet (20 mg total) by mouth daily. In place of Atenolol, Disp: 90 tablet, Rfl: 0   nortriptyline (PAMELOR) 50 MG capsule, Take 50 mg by mouth at bedtime., Disp: , Rfl:    omeprazole (PRILOSEC) 20 MG capsule, TAKE 1 CAPSULE BY MOUTH EVERY DAY BEFORE BREAKFAST, Disp: 90 capsule, Rfl: 0   venlafaxine XR (EFFEXOR-XR) 150 MG 24 hr capsule, Take 1  capsule (150 mg total) by mouth daily with breakfast., Disp: 90 capsule, Rfl: 1  Allergies  Allergen Reactions   Latex Anaphylaxis   Belviq [Lorcaserin Hcl]     Dry mouth, palpitation   Tomato Hives    "Cherry tomatoes"   Vicodin [Hydrocodone-Acetaminophen] Hives   Imitrex [Sumatriptan] Palpitations    Intolerance     I personally reviewed active problem list, medication list, allergies, family history, social history, health maintenance with the patient/caregiver today.   ROS  ***  Objective  There were no vitals filed for this visit.  There is no height or weight on file to calculate BMI.  Physical Exam ***  No results found for this or any previous visit (from the past 2160 hour(s)).   PHQ2/9:    04/05/2022    2:08 PM 12/21/2021    3:17 PM 09/15/2021    2:51 PM 06/21/2021    3:35 PM 03/16/2021    2:15 PM  Depression screen PHQ 2/9  Decreased Interest '2 1 1 1 '$ 0  Down, Depressed, Hopeless '2 1 2 1 1  '$ PHQ - 2 Score '4 2 3 2 1  '$ Altered sleeping 2 1 0 0 1  Tired, decreased energy '2 2 1 '$ 0 0  Change in appetite 0 1 0 0 3  Feeling bad or failure about yourself  1 1 0 2 0  Trouble concentrating '3 1 2 3 3  '$ Moving slowly or fidgety/restless 0 0 0 0 0  Suicidal thoughts 0 0 0 0 0  PHQ-9 Score '12 8 6 7 8  '$ Difficult doing work/chores  Not difficult at all       phq 9 is {gen pos [id number]}   Fall Risk:    04/05/2022    2:08 PM 12/21/2021    3:16 PM 09/15/2021    2:50 PM 06/21/2021    3:35 PM 03/16/2021    2:15 PM  Fall Risk    Falls in the past year? '1 1 1 '$ 0 0  Number falls in past yr: 1 1 0 0 0  Injury with Fall? 1 0 1 0 0  Risk for fall due to : No Fall Risks History of fall(s) No Fall Risks    Follow up Falls prevention discussed Falls evaluation completed;Education provided;Falls prevention discussed Falls prevention discussed        Functional Status Survey:      Assessment & Plan  *** There are no diagnoses linked to this encounter.

## 2022-08-05 ENCOUNTER — Ambulatory Visit: Payer: Commercial Managed Care - PPO | Admitting: Family Medicine

## 2022-08-22 NOTE — Progress Notes (Unsigned)
Name: Tammy Swanson   MRN: [id number]    DOB: 07/15/1966   Date:08/23/2022       Progress Note  Subjective  Chief Complaint  Follow Up  HPI  Depression Mild Recurrent: she was   going to a [facility] for therapy however lost to follow up due to change in insurance . 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 She is now separated from her husband and is struggling financially and having to stay with a friend now - still misses having her own space, but better than living with her sister  She is still taking Effexor 225 mg  and also now on Rexulti, she was seen by Dr. Melrose Nakayama and medication is supposed to be changed to Seroquel but [facility] does not have it in stock, advised to switch to [facility] and use [facility].    Headaches: she has a long  history of migraines as an young adult, she was doing well, however last Fall 22 she noticed  symptoms returned. We tried Topamax and it was working but it caused brain fog and because of severe symptoms  she was referred to Dr. Melrose Nakayama, she is now off Topamax and taking Nortriptyline 50 mg every night , higher dose of Effexor, Nadolol and Ubrelvy three times a week ( but not covered by insurance so she is currently only using it prn ) Migraine is usually throbbing, severe, usually right side of head. She stopped Imitrex since it is not as efficacious and also does not like side effects.  Episodes are sporadic now, last episode about once a month   HTN: bp is at goal. She denies side effects of medication. She denies chest pain or  palpitation. She has intermittent SOB due to asthma, unchanged    Pre-diabetes: no polyphagia, polydipsia or polyuria. Last A1C was 6.1 % ,not eating a healthy diet , we will  recheck labs today   Vitamin D and B12 deficiency: doing well on otc supplementation  Reviewed labs with patient   Hypercalcemia: we will check parathyroid test   GERD/Chronic gastritis: she is feeling much better since taking Omeprazole - and symptoms are controlled   Asthma moderate persistent  she states present in the Spring and Fall.  She has wheezing and SOB , no cough.   Obesity Morbid: BMI above 35  with co-morbidities such as GERD, HTN and pre-diabetes , she would like to start medication for weight loss, discussed GLP-1 agonist, however she has a personal history of pancreatitis she states it was secondary to gallstones and took trulicity in the past without problems.   Patient Active Problem List   Diagnosis Date Noted   Polyp of transverse colon    Transient confusion 05/25/2020   Headache disorder 10/22/2019   Sleep disorder 10/22/2019   History of carpal tunnel surgery of right wrist 12/06/2016   Prediabetes 10/15/2016   History of liver failure 08/24/2016   Migraine without aura and without status migrainosus, not intractable 09/10/2015   Eczema 09/10/2015   Bad memory 06/01/2015   Menopause 06/01/2015   Cough variant asthma 06/01/2015   Obesity (BMI 30-39.9) 06/01/2015   Nodule, subcutaneous 06/01/2015   Lesion  of ulnar nerve 06/01/2015   Vitiligo 06/01/2015   Benign hypertension 09/03/2010    Past Surgical History:  Procedure Laterality Date   ABDOMINAL EXPLORATION SURGERY     x3   ABDOMINAL HYSTERECTOMY  2013   ABDOMINAL HYSTERECTOMY  2014   CARPAL TUNNEL RELEASE Right 12/06/2016   Procedure: CARPAL TUNNEL RELEASE;  Surgeon: Hessie Knows, MD;  Location: [facility];  Service: Orthopedics;  Laterality: Right;   CHOLECYSTECTOMY     COLONOSCOPY WITH PROPOFOL N/A 07/19/2021   Procedure: COLONOSCOPY WITH BIOPSY;  Surgeon: Lucilla Lame, MD;  Location: [place];  Service: Endoscopy;  Laterality: N/A;   ESOPHAGOGASTRODUODENOSCOPY (EGD) WITH PROPOFOL N/A  07/19/2021   Procedure: ESOPHAGOGASTRODUODENOSCOPY (EGD) WITH BIOPSY;  Surgeon: Lucilla Lame, MD;  Location: [place];  Service: Endoscopy;  Laterality: N/A;  Latex   FOOT SURGERY     FOOT SURGERY Left 2007/2009   Dr. Elvina Mattes   KNEE ARTHROSCOPY Left 1985   KNEE SURGERY Left    MANDIBLE FRACTURE SURGERY     NASAL SINUS SURGERY     NASAL SINUS SURGERY  2012   POLYPECTOMY N/A 07/19/2021   Procedure: POLYPECTOMY;  Surgeon: Lucilla Lame, MD;  Location: [place];  Service: Endoscopy;  Laterality: N/A;   RECONSTRUCTION MANDIBLE / MAXILLA  1992   TONSILLECTOMY     TONSILLECTOMY  1981   TRIGGER FINGER RELEASE      Family History  Problem Relation Age of Onset   Hypertension Mother    Cancer Mother        Breast, colon   HIV/AIDS Mother    Breast cancer Mother    Cancer Father        lung cancer   Hypertension Sister    Hypertension Maternal Grandmother    Hypertension Maternal Grandfather    Bipolar disorder Daughter    Cancer Maternal Aunt        3 sisters with Breast Cancer   Breast cancer Maternal Aunt    Breast cancer Maternal Aunt    Cancer Paternal Aunt    Cancer Paternal Uncle     Social History   Tobacco Use   Smoking status: Never   Smokeless tobacco: Never  Substance Use Topics   Alcohol use: Yes    Comment: occasionally     Current Outpatient Medications:    albuterol (VENTOLIN HFA) 108 (90 Base) MCG/ACT inhaler, TAKE 2 PUFFS BY MOUTH EVERY 6 HOURS AS NEEDED FOR WHEEZE OR SHORTNESS OF BREATH, Disp: 18 each, Rfl: 0   cholecalciferol (VITAMIN D) 1000 units tablet, Take 1,000 Units by mouth daily., Disp: , Rfl:    Cyanocobalamin (B-12) 1000 MCG SUBL, Place 1 tablet under the tongue daily., Disp: , Rfl:    estradiol (VIVELLE-DOT) 0.05 MG/24HR patch, APPLY 1 PATCH TWICE WEEKLY, Disp: 24 patch, Rfl: 0   Fluticasone-Umeclidin-Vilant (TRELEGY ELLIPTA) 100-62.5-25 MCG/ACT AEPB, Inhale 1 puff into the lungs daily., Disp: 3 each, Rfl: 1    losartan-hydrochlorothiazide (HYZAAR) 100-12.5 MG tablet, Take 1 tablet by mouth daily., Disp: 90 tablet, Rfl: 0   MAGNESIUM PO, Take by mouth., Disp: , Rfl:    montelukast (SINGULAIR) 10 MG tablet, TAKE 1 TABLET BY MOUTH EVERYDAY AT BEDTIME, Disp: 90 tablet, Rfl: 1   nadolol (CORGARD) 20 MG tablet, Take 1 tablet (20 mg total) by mouth daily. In place of Atenolol, Disp: 90 tablet, Rfl: 0   nortriptyline (PAMELOR) 50 MG capsule, Take 50 mg by mouth at bedtime., Disp: , Rfl:    omeprazole (  PRILOSEC) 20 MG capsule, TAKE 1 CAPSULE BY MOUTH EVERY DAY BEFORE BREAKFAST, Disp: 90 capsule, Rfl: 0   QUEtiapine (SEROQUEL) 25 MG tablet, Take 75 mg by mouth at bedtime., Disp: , Rfl:    UBRELVY 100 MG TABS, Take 100 mg by mouth., Disp: , Rfl:    venlafaxine (EFFEXOR) 75 MG tablet, Take 225 mg by mouth daily at 12 noon., Disp: , Rfl:   Allergies  Allergen Reactions   Latex Anaphylaxis   Belviq [Lorcaserin Hcl]     Dry mouth, palpitation   Tomato Hives    "Cherry tomatoes"   Vicodin [Hydrocodone-Acetaminophen] Hives   Imitrex [Sumatriptan] Palpitations    Intolerance     I personally reviewed active problem list, medication list, allergies, family history, social history, health maintenance with the patient/caregiver today.   ROS  Constitutional: Negative for fever or weight change.  Respiratory: positive for cough and shortness of breath.   Cardiovascular: Negative for chest pain or palpitations.  Gastrointestinal: Negative for abdominal pain, no bowel changes.  Musculoskeletal: Negative for gait problem or joint swelling.  Skin: Negative for rash.  Neurological: Negative for dizziness , positive for  headache.  No other specific complaints in a complete review of systems (except as listed in HPI above).   Objective  Vitals:   08/23/22 1011  BP: 122/82  Pulse: 86  Resp: 16  SpO2: 96%  Weight: 202 lb (91.6 kg)  Height: '5\' 2"'$  (1.575 m)    Body mass index is 36.95 kg/m.  Physical  Exam  Constitutional: Patient appears well-developed and well-nourished. Obese  No distress.  HEENT: head atraumatic, normocephalic, pupils equal and reactive to light, neck supple Cardiovascular: Normal rate, regular rhythm and normal heart sounds.  No murmur heard. No BLE edema. Pulmonary/Chest: Effort normal and breath sounds normal. No respiratory distress. Abdominal: Soft.  There is no tenderness. Psychiatric: Patient has a normal mood and affect. behavior is normal. Judgment and thought content normal.    PHQ2/9:    08/23/2022   10:11 AM 04/05/2022    2:08 PM 12/21/2021    3:17 PM 09/15/2021    2:51 PM 06/21/2021    3:35 PM  Depression screen PHQ 2/9  Decreased Interest '3 2 1 1 1  '$ Down, Depressed, Hopeless '2 2 1 2 1  '$ PHQ - 2 Score '5 4 2 3 2  '$ Altered sleeping '2 2 1 '$ 0 0  Tired, decreased energy 0 '2 2 1 '$ 0  Change in appetite 2 0 1 0 0  Feeling bad or failure about yourself  0 1 1 0 2  Trouble concentrating '2 3 1 2 3  '$ Moving slowly or fidgety/restless 0 0 0 0 0  Suicidal thoughts 0 0 0 0 0  PHQ-9 Score '11 12 8 6 7  '$ Difficult doing work/chores   Not difficult at all      phq 9 is positive   Fall Risk:    08/23/2022   10:10 AM 04/05/2022    2:08 PM 12/21/2021    3:16 PM 09/15/2021    2:50 PM 06/21/2021    3:35 PM  Fall Risk   Falls in the past year? '1 1 1 1 '$ 0  Number falls in past yr: '1 1 1 '$ 0 0  Injury with Fall? 0 1 0 1 0  Risk for fall due to : No Fall Risks No Fall Risks History of fall(s) No Fall Risks   Follow up Falls prevention discussed Falls prevention discussed Falls evaluation completed;Education provided;Falls  prevention discussed Falls prevention discussed       Functional Status Survey: Is the patient deaf or have difficulty hearing?: No Does the patient have difficulty seeing, even when wearing glasses/contacts?: No Does the patient have difficulty concentrating, remembering, or making decisions?: Yes Does the patient have difficulty walking or climbing  stairs?: No Does the patient have difficulty dressing or bathing?: No Does the patient have difficulty doing errands alone such as visiting a doctor's office or shopping?: No    Assessment & Plan  1. Essential hypertension  - losartan-hydrochlorothiazide (HYZAAR) 100-12.5 MG tablet; Take 1 tablet by mouth daily.  Dispense: 90 tablet; Refill: 1 - nadolol (CORGARD) 20 MG tablet; Take 1 tablet (20 mg total) by mouth daily. In place of Atenolol  Dispense: 90 tablet; Refill: 1  2. Morbid obesity ([place])  Discussed with the patient the risk posed by an increased BMI. Discussed importance of portion control, calorie counting and at least 150 minutes of physical activity weekly. Avoid sweet beverages and drink more water. Eat at least 6 servings of fruit and vegetables daily    3. Moderate episode of recurrent major depressive disorder ([place])   4. Migraine with aura and without status migrainosus, not intractable  - nadolol (CORGARD) 20 MG tablet; Take 1 tablet (20 mg total) by mouth daily. In place of Atenolol  Dispense: 90 tablet; Refill: 1  5. Chronic superficial gastritis without bleeding  - omeprazole (PRILOSEC) 20 MG capsule; TAKE 1 CAPSULE BY MOUTH EVERY DAY BEFORE BREAKFAST  Dispense: 90 capsule; Refill: 1  6. Need for immunization against influenza  - Flu Vaccine QUAD 6+ mos PF IM (Fluarix Quad PF)  7. Prediabetes   8. Hypercalcemia  - Comprehensive metabolic panel - PTH, intact and calcium - Hemoglobin A1c  9. Long-term use of high-risk medication  - Comprehensive metabolic panel

## 2022-08-23 ENCOUNTER — Encounter: Payer: Self-pay | Admitting: Family Medicine

## 2022-08-23 ENCOUNTER — Ambulatory Visit (INDEPENDENT_AMBULATORY_CARE_PROVIDER_SITE_OTHER): Payer: Self-pay | Admitting: Family Medicine

## 2022-08-23 DIAGNOSIS — G43109 Migraine with aura, not intractable, without status migrainosus: Secondary | ICD-10-CM

## 2022-08-23 DIAGNOSIS — Z79899 Other long term (current) drug therapy: Secondary | ICD-10-CM

## 2022-08-23 DIAGNOSIS — K2930 Chronic superficial gastritis without bleeding: Secondary | ICD-10-CM | POA: Insufficient documentation

## 2022-08-23 DIAGNOSIS — R7303 Prediabetes: Secondary | ICD-10-CM

## 2022-08-23 DIAGNOSIS — F331 Major depressive disorder, recurrent, moderate: Secondary | ICD-10-CM

## 2022-08-23 DIAGNOSIS — I10 Essential (primary) hypertension: Secondary | ICD-10-CM

## 2022-08-23 DIAGNOSIS — Z23 Encounter for immunization: Secondary | ICD-10-CM

## 2022-08-23 DIAGNOSIS — Z8719 Personal history of other diseases of the digestive system: Secondary | ICD-10-CM | POA: Insufficient documentation

## 2022-08-23 MED ORDER — LOSARTAN POTASSIUM-HCTZ 100-12.5 MG PO TABS: 1.0000 | ORAL_TABLET | Freq: Every day | ORAL | 1 refills | Status: DC

## 2022-08-23 MED ORDER — SEMAGLUTIDE-WEIGHT MANAGEMENT 1 MG/0.5ML ~~LOC~~ SOAJ: 1.0000 mg | SUBCUTANEOUS | 0 refills | Status: DC

## 2022-08-23 MED ORDER — OMEPRAZOLE 20 MG PO CPDR: DELAYED_RELEASE_CAPSULE | ORAL | 1 refills | Status: DC

## 2022-08-23 MED ORDER — NADOLOL 20 MG PO TABS: 20.0000 mg | ORAL_TABLET | Freq: Every day | ORAL | 1 refills | Status: DC

## 2022-08-23 MED ORDER — SEMAGLUTIDE-WEIGHT MANAGEMENT 0.25 MG/0.5ML ~~LOC~~ SOAJ: 0.2500 mg | SUBCUTANEOUS | 0 refills | Status: AC

## 2022-09-18 ENCOUNTER — Emergency Department
Admission: EM | Admit: 2022-09-18 | Discharge: 2022-09-18 | Discharge status: Against Medical Advice | Payer: Commercial Managed Care - PPO

## 2022-09-18 NOTE — ED Triage Notes (Signed)
No answer when called to triage.

## 2022-09-18 NOTE — ED Triage Notes (Signed)
Called and spoke to husband. Pt left due to wait time. Will go to alternative ED per husband's report.

## 2022-09-19 ENCOUNTER — Telehealth: Payer: Self-pay | Admitting: Family Medicine

## 2022-09-19 NOTE — Telephone Encounter (Signed)
Just FYS. We received a message  todayfrom [facility] about patient being very sick and  called at 7:21 am on 09-19-2022 to se about getting her in for  an appt and husband informed me that they went to ER at [facility] and that due to the wait they went to [facility] for they have found bilirubin in her urine with other things and there is a chance they may send her to [facility] for more testing.Told him to keep [place] informed and call us if need to.

## 2022-10-04 ENCOUNTER — Encounter: Payer: Self-pay | Admitting: Internal Medicine

## 2022-10-04 ENCOUNTER — Ambulatory Visit: Payer: Managed Care, Other (non HMO) | Admitting: Internal Medicine

## 2022-10-04 VITALS — BP 124/86 | HR 112 | Temp 97.9°F | Resp 16 | Ht 62.0 in | Wt 187.1 lb

## 2022-10-04 DIAGNOSIS — Z09 Encounter for follow-up examination after completed treatment for conditions other than malignant neoplasm: Secondary | ICD-10-CM | POA: Diagnosis not present

## 2022-10-04 DIAGNOSIS — K9189 Other postprocedural complications and disorders of digestive system: Secondary | ICD-10-CM | POA: Diagnosis not present

## 2022-10-04 DIAGNOSIS — I10 Essential (primary) hypertension: Secondary | ICD-10-CM | POA: Diagnosis not present

## 2022-10-04 DIAGNOSIS — K2930 Chronic superficial gastritis without bleeding: Secondary | ICD-10-CM

## 2022-10-04 DIAGNOSIS — J4530 Mild persistent asthma, uncomplicated: Secondary | ICD-10-CM

## 2022-10-04 DIAGNOSIS — K859 Acute pancreatitis without necrosis or infection, unspecified: Secondary | ICD-10-CM

## 2022-10-04 MED ORDER — LOSARTAN POTASSIUM-HCTZ 100-12.5 MG PO TABS: 1.0000 | ORAL_TABLET | Freq: Every day | ORAL | 1 refills | Status: DC

## 2022-10-04 MED ORDER — MONTELUKAST SODIUM 10 MG PO TABS: ORAL_TABLET | ORAL | 1 refills | Status: DC

## 2022-10-04 MED ORDER — OMEPRAZOLE 20 MG PO CPDR: DELAYED_RELEASE_CAPSULE | ORAL | 1 refills | Status: DC

## 2022-10-04 MED ORDER — NADOLOL 20 MG PO TABS: 20.0000 mg | ORAL_TABLET | Freq: Every day | ORAL | 1 refills | Status: DC

## 2022-10-04 NOTE — Patient Instructions (Addendum)
It was great seeing you today!  Plan discussed at today's visit: -Blood work ordered today, results will be uploaded to [facility].  -Continue to take pain medication as directed, if you are having symptoms try to go on bowel rest and only eat when you are hungry.  When you are hungry stick to bland foods like broth, Jell-O, toast, etc.  Follow up in: Already scheduled in December  Take care and let us know if you have any questions or concerns prior to your next visit.  Dr. Rosana Berger

## 2022-10-04 NOTE — Progress Notes (Signed)
 Established Patient Office Visit  Subjective   Patient ID: Tammy Swanson, female    DOB: 08/09/1966  Age: 56 y.o. MRN: [id number]  Chief Complaint  Patient presents with   Hospitalization Follow-up    surgery    HPI  Patient is here for hospital follow-up.  Discharge Date: 09/28/2022 Hospital/facility: [facility] Diagnosis: Hyperbilirubinemia Procedures/tests: MRCP showing small volume concentrated biliary sludge versus nonobstructive stones as well as possible stricturing of the left hepatic duct.  A subsequent ERCP performed on 10/10 showed no stricture at the left hepatic duct but diffusely dilated ducts noted in the setting of stenosis of previous sphincterectomy site.  Ducts were swept and sphincterectomy performed with stent placement. Consultants: GI New medications: She was discharged with Dilaudid for pain and Compazine for nausea Discontinued medications: None Discharge instructions: Follow-up in 4 weeks for repeat ERCP and stent removal Status: fluctuating  Patient has a past medical history of remote history of cholecystectomy with subsequent complications and multiple abdominal surgeries who presented to the ER with biliary colic.  She underwent ERCP with sphincterotomy, stent placement in the setting of biliary outlet stenosis sphenoidectomy site that resulted in post ERCP pancreatitis.  She presented with nausea, vomiting, abdominal pain, found to have elevated bilirubin and LFTs.  Apparently 30 years ago she had a cholecystectomy with multiple complications and multiple abdominal surgeries, since that time she has been asymptomatic.  She ultimately underwent MRCP, results above and ERCP, again results above.  She underwent sphincterectomy with stent placement, LFTs decreased initially but then started increasing again in the setting of persistent abdominal pain and nausea.  She was then found to have post ERCP pancreatitis confirmed by CT on 10/12.  She was  treated with Zosyn.  Eventually her symptoms resolved and she was able to tolerate a regular diet.  She is to follow-up in 4 weeks for repeat ERCP and stent removal.  Of note she also was found to have an elevated troponin secondary to demand ischemia and chronic hypertension.  Apparently she had missed some of her home blood pressure medications due to abdominal pain and nausea.  EKG was without abnormalities and her troponin levels down trended.  She was restarted on her home losartan with good control of blood pressure.   Since she left the hospital, she states her abdominal pain is up and down.  Last night her pain got as high as it has been since she has been home, rating it a 6 or 7 out of 10.  Currently it is a 3 out of 10.  She has been taking her pain medication as directed and is trying to eat bland foods.  She has not eaten much today but yesterday she ate cereal, applesauce, soup from [employer] and have a ham sandwich.  She denies nausea or vomiting.  Has had some gas pains yesterday, was able to have 2 bowel movements but noted the stools were a green/yellow in color.  She also notes dark-colored urine this been going on since she was in the hospital.  She does have a follow-up appointment with her surgeon on 11/21.  Hypertension: -Medications: Losartan-HCTZ 100-12.5 mg, nadolol 20 mg -Patient is compliant with above medications and reports no side effects. -Denies any SOB, CP, vision changes, LE edema or symptoms of hypotension  Prediabetes: -Last A1c 1/23 6.1 -Not on any GLP-1's, Wegovy was in her chart but she never started this medication     Review of Systems  Constitutional:  Negative for   chills and fever.  Respiratory:  Negative for shortness of breath.   Cardiovascular:  Negative for chest pain.  Gastrointestinal:  Positive for abdominal pain. Negative for constipation, diarrhea, nausea and vomiting.      Objective:     BP 124/86   Pulse (!) 112   Temp 97.9 F (36.6 C)    Resp 16   Ht 5' 2" (1.575 m)   Wt 187 lb 1.6 oz (84.9 kg)   SpO2 98%   BMI 34.22 kg/m  BP Readings from Last 3 Encounters:  08/23/22 122/82  04/05/22 136/84  12/21/21 (!) 150/90   Wt Readings from Last 3 Encounters:  10/04/22 187 lb 1.6 oz (84.9 kg)  08/23/22 202 lb (91.6 kg)  04/05/22 205 lb (93 kg)      Physical Exam Constitutional:      Appearance: Normal appearance.  HENT:     Head: Normocephalic and atraumatic.  Eyes:     Conjunctiva/sclera: Conjunctivae normal.  Cardiovascular:     Rate and Rhythm: Normal rate and regular rhythm.  Pulmonary:     Effort: Pulmonary effort is normal.     Breath sounds: Normal breath sounds.  Abdominal:     General: Bowel sounds are normal. There is no distension.     Palpations: Abdomen is soft.     Tenderness: There is abdominal tenderness. There is no right CVA tenderness or left CVA tenderness.     Comments: Epigastric pain to palpation  Neurological:     Mental Status: She is alert.      No results found for any visits on 10/04/22.  Last CBC Lab Results  Component Value Date   WBC 9.7 12/22/2021   HGB 14.5 12/22/2021   HCT 44.7 12/22/2021   MCV 86 12/22/2021   MCH 27.9 12/22/2021   RDW 13.4 12/22/2021   PLT 416 12/22/2021   Last metabolic panel Lab Results  Component Value Date   GLUCOSE 90 12/22/2021   NA 142 12/22/2021   K 4.6 12/22/2021   CL 101 12/22/2021   CO2 25 12/22/2021   BUN 10 12/22/2021   CREATININE 0.86 12/22/2021   EGFR 79 12/22/2021   CALCIUM 10.3 (H) 12/22/2021   PROT 7.8 12/22/2021   ALBUMIN 4.2 12/22/2021   LABGLOB 3.6 12/22/2021   AGRATIO 1.2 12/22/2021   BILITOT 0.3 12/22/2021   ALKPHOS 109 12/22/2021   AST 22 12/22/2021   ALT 14 12/22/2021   ANIONGAP 11 03/03/2018   Last lipids Lab Results  Component Value Date   CHOL 196 12/22/2021   HDL 63 12/22/2021   LDLCALC 105 (H) 12/22/2021   TRIG 163 (H) 12/22/2021   CHOLHDL 3.1 12/22/2021   Last hemoglobin A1c Lab Results   Component Value Date   HGBA1C 6.1 (H) 12/22/2021   Last thyroid functions Lab Results  Component Value Date   TSH 1.640 12/22/2021   T4TOTAL 6.3 09/11/2015   Last vitamin D Lab Results  Component Value Date   VD25OH 32.6 12/22/2021   Last vitamin B12 and Folate Lab Results  Component Value Date   VITAMINB12 786 12/22/2021      The 10-year ASCVD risk score (Arnett DK, et al., 2019) is: 4.5%    Assessment & Plan:   1. Hospital discharge follow-up/Post-ERCP acute pancreatitis: Discharge summary, admission summary and all labs, imaging and procedural notes reviewed from recent health polarization.  Symptoms fluctuating, she currently rates her epigastric pain a 3 out of 10.  We will go ahead and repeat   lipase, CMP and CBC today downtrending.  She does have a follow-up with her surgeon scheduled at the end of November.  We discussed taking her pain medications as directed and bowel rest and increasing pain as well as eating bland foods and avoiding fatty foods.  She will follow-up sooner if symptoms worsen or fail to improve.  - COMPLETE METABOLIC PANEL WITH GFR - Lipase - CBC w/Diff/Platelet  2. Essential hypertension: Blood pressure much better controlled today.  She denies chest pain or shortness of breath.  She does need refills of her home blood pressure medications, continue losartan-HCTZ 100-12.5 mg daily and nadolol 20 mg daily, sent to pharmacy.  - losartan-hydrochlorothiazide (HYZAAR) 100-12.5 MG tablet; Take 1 tablet by mouth daily.  Dispense: 30 tablet; Refill: 1 - nadolol (CORGARD) 20 MG tablet; Take 1 tablet (20 mg total) by mouth daily. In place of Atenolol  Dispense: 30 tablet; Refill: 1  3. Mild persistent asthma without complication: Patient requesting refills of Singulair 10 mg, sent to pharmacy.  - montelukast (SINGULAIR) 10 MG tablet; TAKE 1 TABLET BY MOUTH EVERYDAY AT BEDTIME  Dispense: 30 tablet; Refill: 1  4. Chronic superficial gastritis without  bleeding: Prilosec 20 mg refilled today as well.  - omeprazole (PRILOSEC) 20 MG capsule; TAKE 1 CAPSULE BY MOUTH EVERY DAY BEFORE BREAKFAST  Dispense: 30 capsule; Refill: 1   Return for Already scheduled.    Elisabeth Andrews, DO  

## 2022-10-05 LAB — CBC WITH DIFFERENTIAL/PLATELET
Absolute Monocytes: 511 cells/uL (ref 200–950)
Basophils Absolute: 59 cells/uL (ref 0–200)
Basophils Relative: 0.8 %
Eosinophils Absolute: 222 cells/uL (ref 15–500)
Eosinophils Relative: 3 %
HCT: 40.7 % (ref 35.0–45.0)
Hemoglobin: 13.4 g/dL (ref 11.7–15.5)
Lymphs Abs: 1702 cells/uL (ref 850–3900)
MCH: 27.5 pg (ref 27.0–33.0)
MCHC: 32.9 g/dL (ref 32.0–36.0)
MCV: 83.4 fL (ref 80.0–100.0)
MPV: 10 fL (ref 7.5–12.5)
Monocytes Relative: 6.9 %
Neutro Abs: 4906 cells/uL (ref 1500–7800)
Neutrophils Relative %: 66.3 %
Platelets: 685 10*3/uL — ABNORMAL HIGH (ref 140–400)
RBC: 4.88 10*6/uL (ref 3.80–5.10)
RDW: 13.1 % (ref 11.0–15.0)
Total Lymphocyte: 23 %
WBC: 7.4 10*3/uL (ref 3.8–10.8)

## 2022-10-05 LAB — COMPLETE METABOLIC PANEL WITH GFR
AG Ratio: 0.9 (calc) — ABNORMAL LOW (ref 1.0–2.5)
ALT: 38 U/L — ABNORMAL HIGH (ref 6–29)
AST: 39 U/L — ABNORMAL HIGH (ref 10–35)
Albumin: 3.9 g/dL (ref 3.6–5.1)
Alkaline phosphatase (APISO): 111 U/L (ref 37–153)
BUN: 9 mg/dL (ref 7–25)
CO2: 24 mmol/L (ref 20–32)
Calcium: 10.1 mg/dL (ref 8.6–10.4)
Chloride: 103 mmol/L (ref 98–110)
Creat: 0.81 mg/dL (ref 0.50–1.03)
Globulin: 4.3 g/dL (calc) — ABNORMAL HIGH (ref 1.9–3.7)
Glucose, Bld: 91 mg/dL (ref 65–99)
Potassium: 4.5 mmol/L (ref 3.5–5.3)
Sodium: 144 mmol/L (ref 135–146)
Total Bilirubin: 0.8 mg/dL (ref 0.2–1.2)
Total Protein: 8.2 g/dL — ABNORMAL HIGH (ref 6.1–8.1)
eGFR: 84 mL/min/{1.73_m2} (ref >=60)

## 2022-10-05 LAB — LIPASE: Lipase: 212 U/L — ABNORMAL HIGH (ref 7–60)

## 2022-10-10 ENCOUNTER — Telehealth: Payer: Self-pay | Admitting: Family Medicine

## 2022-10-10 NOTE — Telephone Encounter (Signed)
Copied from [place] [id number]. Topic: General - Other >> Oct 10, 2022  4:06 PM [place] B wrote: Reason for CRM: Patients spuse called in wanting to drop off FMLA paperwork for patient.

## 2022-10-10 NOTE — Telephone Encounter (Signed)
Spoke with husband Aaron Edelman and he wants to drop off FMLA paperwork for wife in the morning. She is currently in the hospital and the hospital will not fill it out for her. Please advise as to where he should take the FMLA if Dr Ancil Boozer is not able to do it

## 2022-10-11 NOTE — Telephone Encounter (Signed)
Pt is back in the hosp. Said she went home and had to go right back

## 2022-10-20 NOTE — Progress Notes (Signed)
Name: Tammy Swanson   MRN: [id number]    DOB: 02/20/1966   Date:10/21/2022       Progress Note  [place]  HPI  Admitted: 10/08/22 Discharged: 10/17/22  She  is still feeling tired, , she is eating a bland diet and is afraid to eat proteins and get sick again,  since she went home on Monday from her second hospital admission. No nausea or vomiting and abdominal pain is only present when she first starts to eat . She has lost weight and albumin dropped ( malnutrition)   Patient went to [facility] on 09/18/2022 due to abdominal pain, she was diagnosed with biliary colic ( remote history of cholecystectomy with subsequent complications and multiple surgery including sphincterotomy) , she had a stent placed due to biliary outlet stenosis at previous sphincterotomy site on 09/20/2022 , she had post ERCP pancreatitis seen on CT done 10/12, she was given Zosyn due to rising leucocytosis but it was stopped due to no other signs or symptoms of infection. Symptoms improved and she was sent home. She was advised to have another ERCP for stent removal in 4 weeks, however she ended up back at [facility] for nausea, vomiting and abdominal pain on 10/08/2022 , CT showed a new finding of acute peripancreatic fluid collection, she was given IV fluids, IV and oral opiate analgesic and had ERCP to remove metal stent .   She has an appointment with GI at [facility] on 11/01/2022   Medication reconciliation was done  Per discharge summary she was given carvedilol and losartan upon discharge, stopped hctz. Today she told me she has not been taking losartan hctz and bp was at goal. She does not have an active medication called carvedilol, she is still taking nadolol   Advised to monitor bp every morning and resume losartan at half dose if bp stays around 140/90, if higher than that go up to full dose and return for regular follow up in a few weeks.   Patient Active Problem List    Diagnosis Date Noted   History of pancreatitis 08/23/2022   Chronic superficial gastritis without bleeding 08/23/2022   Migraine with aura and without status migrainosus, not intractable 08/23/2022   Moderate episode of recurrent major depressive disorder ([place]) 08/23/2022   Morbid obesity ([place]) 08/23/2022   Hypercalcemia 08/23/2022   Polyp of transverse colon    Transient confusion 05/25/2020   Headache disorder 10/22/2019   Sleep disorder 10/22/2019   History of carpal tunnel surgery of right wrist 12/06/2016   Prediabetes 10/15/2016   History of liver failure 08/24/2016   Migraine without aura and without status migrainosus, not intractable 09/10/2015   Eczema 09/10/2015   Bad memory 06/01/2015   Menopause 06/01/2015   Cough variant asthma 06/01/2015   Obesity (BMI 30-39.9) 06/01/2015   Nodule, subcutaneous 06/01/2015   Lesion of ulnar nerve 06/01/2015   Vitiligo 06/01/2015   Essential hypertension 09/03/2010    Past Surgical History:  Procedure Laterality Date   ABDOMINAL EXPLORATION SURGERY     x3   ABDOMINAL HYSTERECTOMY  2013   ABDOMINAL HYSTERECTOMY  2014   CARPAL TUNNEL RELEASE Right 12/06/2016   Procedure: CARPAL TUNNEL RELEASE;  Surgeon: Hessie Knows, MD;  Location: [facility];  Service: Orthopedics;  Laterality: Right;   CHOLECYSTECTOMY     COLONOSCOPY WITH PROPOFOL N/A 07/19/2021   Procedure: COLONOSCOPY WITH BIOPSY;  Surgeon: Lucilla Lame, MD;  Location: [place];  Service: Endoscopy;  Laterality: N/A;   ESOPHAGOGASTRODUODENOSCOPY (EGD) WITH PROPOFOL N/A 07/19/2021   Procedure: ESOPHAGOGASTRODUODENOSCOPY (EGD) WITH BIOPSY;  Surgeon: Lucilla Lame, MD;  Location: [place];  Service: Endoscopy;  Laterality: N/A;  Latex   FOOT SURGERY     FOOT SURGERY Left 2007/2009   Dr. Elvina Mattes   KNEE ARTHROSCOPY Left 1985   KNEE SURGERY Left    MANDIBLE FRACTURE SURGERY     NASAL SINUS SURGERY     NASAL SINUS SURGERY  2012   POLYPECTOMY N/A 07/19/2021    Procedure: POLYPECTOMY;  Surgeon: Lucilla Lame, MD;  Location: [place];  Service: Endoscopy;  Laterality: N/A;   RECONSTRUCTION MANDIBLE / MAXILLA  1992   TONSILLECTOMY     TONSILLECTOMY  1981   TRIGGER FINGER RELEASE      Family History  Problem Relation Age of Onset   Hypertension Mother    Cancer Mother        Breast, colon   HIV/AIDS Mother    Breast cancer Mother    Cancer Father        lung cancer   Hypertension Sister    Hypertension Maternal Grandmother    Hypertension Maternal Grandfather    Bipolar disorder Daughter    Cancer Maternal Aunt        3 sisters with Breast Cancer   Breast cancer Maternal Aunt    Breast cancer Maternal Aunt    Cancer Paternal Aunt    Cancer Paternal Uncle     Social History   Tobacco Use   Smoking status: Never   Smokeless tobacco: Never  Substance Use Topics   Alcohol use: Yes    Comment: occasionally     Current Outpatient Medications:    albuterol (VENTOLIN HFA) 108 (90 Base) MCG/ACT inhaler, TAKE 2 PUFFS BY MOUTH EVERY 6 HOURS AS NEEDED FOR WHEEZE OR SHORTNESS OF BREATH, Disp: 18 each, Rfl: 0   cholecalciferol (VITAMIN D) 1000 units tablet, Take 1,000 Units by mouth daily., Disp: , Rfl:    Cyanocobalamin (B-12) 1000 MCG SUBL, Place 1 tablet under the tongue daily., Disp: , Rfl:    estradiol (VIVELLE-DOT) 0.05 MG/24HR patch, APPLY 1 PATCH TWICE WEEKLY, Disp: 24 patch, Rfl: 0   Fluticasone-Umeclidin-Vilant (TRELEGY ELLIPTA) 100-62.5-25 MCG/ACT AEPB, Inhale 1 puff into the lungs daily., Disp: 3 each, Rfl: 1   losartan-hydrochlorothiazide (HYZAAR) 100-12.5 MG tablet, Take 1 tablet by mouth daily., Disp: 30 tablet, Rfl: 1   MAGNESIUM PO, Take by mouth., Disp: , Rfl:    montelukast (SINGULAIR) 10 MG tablet, TAKE 1 TABLET BY MOUTH EVERYDAY AT BEDTIME, Disp: 30 tablet, Rfl: 1   nadolol (CORGARD) 20 MG tablet, Take 1 tablet (20 mg total) by mouth daily. In place of Atenolol, Disp: 30 tablet, Rfl: 1   nortriptyline (PAMELOR)  50 MG capsule, Take 50 mg by mouth at bedtime., Disp: , Rfl:    omeprazole (PRILOSEC) 20 MG capsule, TAKE 1 CAPSULE BY MOUTH EVERY DAY BEFORE BREAKFAST, Disp: 30 capsule, Rfl: 1   oxycodone (OXY-IR) 5 MG capsule, Take 5 mg by mouth every 4 (four) hours as needed for pain (up to 5 days)., Disp: , Rfl:    UBRELVY 100 MG TABS, Take 100 mg by mouth., Disp: , Rfl:    venlafaxine (EFFEXOR) 75 MG tablet, Take 225 mg by mouth daily at 12 noon., Disp: , Rfl:    QUEtiapine (SEROQUEL) 25 MG tablet, Take 75 mg by mouth at bedtime. (Patient not taking: Reported on 10/21/2022), Disp: , Rfl:  Allergies  Allergen Reactions   Latex Anaphylaxis   Belviq [Lorcaserin Hcl]     Dry mouth, palpitation   Tomato Hives    "Cherry tomatoes"   Vicodin [Hydrocodone-Acetaminophen] Hives   Imitrex [Sumatriptan] Palpitations    Intolerance     I personally reviewed active problem list, medication list, allergies, family history, social history, health maintenance with the patient/caregiver today.   ROS  Ten systems reviewed and is negative except as mentioned in HPI   Objective  Vitals:   10/21/22 0840  BP: 132/76  Pulse: (!) 107  Resp: 16  SpO2: 98%  Weight: 180 lb (81.6 kg)  Height: _0  (1.575 m)    Body mass index is 32.92 kg/m.  Physical Exam  Constitutional: Patient appears well-developed and well-nourished. Obese No distress.  HEENT: head atraumatic, normocephalic, pupils equal and reactive to light, neck supple Cardiovascular: Normal rate, regular rhythm and normal heart sounds.  No murmur heard. No BLE edema. Pulmonary/Chest: Effort normal and breath sounds normal. No respiratory distress. Abdominal: Soft.  There is a lot of abdominal scars from previous surgeries, normal bowel sounds, diffuse abdominal discomfort worse on LUQ, no rebound tenderness  Psychiatric: Patient has a depressed mood . behavior is normal. Judgment and thought content normal.   Recent Results (from the past 2160  hour(s))  COMPLETE METABOLIC PANEL WITH GFR     Status: Abnormal   Collection Time: 10/04/22  3:56 PM  Result Value Ref Range   Glucose, Bld 91 65 - 99 mg/dL    Comment: .            Fasting reference interval .    BUN 9 7 - 25 mg/dL   Creat 0.81 0.50 - 1.03 mg/dL   eGFR 84 > OR = 60 mL/min/1.66m   BUN/Creatinine Ratio SEE NOTE: 6 - 22 (calc)    Comment:    Not Reported: BUN and Creatinine are within    reference range. .    Sodium 144 135 - 146 mmol/L   Potassium 4.5 3.5 - 5.3 mmol/L   Chloride 103 98 - 110 mmol/L   CO2 24 20 - 32 mmol/L   Calcium 10.1 8.6 - 10.4 mg/dL   Total Protein 8.2 (H) 6.1 - 8.1 g/dL   Albumin 3.9 3.6 - 5.1 g/dL   Globulin 4.3 (H) 1.9 - 3.7 g/dL (calc)   AG Ratio 0.9 (L) 1.0 - 2.5 (calc)   Total Bilirubin 0.8 0.2 - 1.2 mg/dL   Alkaline phosphatase (APISO) 111 37 - 153 U/L   AST 39 (H) 10 - 35 U/L   ALT 38 (H) 6 - 29 U/L  Lipase     Status: Abnormal   Collection Time: 10/04/22  3:56 PM  Result Value Ref Range   Lipase 212 (H) 7 - 60 U/L    Comment: Verified by repeat analysis. .Marland Kitchen  CBC w/Diff/Platelet     Status: Abnormal   Collection Time: 10/04/22  3:56 PM  Result Value Ref Range   WBC 7.4 3.8 - 10.8 Thousand/uL   RBC 4.88 3.80 - 5.10 Million/uL   Hemoglobin 13.4 11.7 - 15.5 g/dL   HCT 40.7 35.0 - 45.0 %   MCV 83.4 80.0 - 100.0 fL   MCH 27.5 27.0 - 33.0 pg   MCHC 32.9 32.0 - 36.0 g/dL   RDW 13.1 11.0 - 15.0 %   Platelets 685 (H) 140 - 400 Thousand/uL   MPV 10.0 7.5 - 12.5 fL   Neutro Abs  4,906 1,500 - 7,800 cells/uL   Lymphs Abs 1,702 850 - 3,900 cells/uL   Absolute Monocytes 511 200 - 950 cells/uL   Eosinophils Absolute 222 15 - 500 cells/uL   Basophils Absolute 59 0 - 200 cells/uL   Neutrophils Relative % 66.3 %   Total Lymphocyte 23.0 %   Monocytes Relative 6.9 %   Eosinophils Relative 3.0 %   Basophils Relative 0.8 %    PHQ2/9:    10/21/2022    8:51 AM 10/04/2022    3:38 PM 08/23/2022   10:11 AM 04/05/2022    2:08 PM  12/21/2021    3:17 PM  Depression screen PHQ 2/9  Decreased Interest _0 Down, Depressed, Hopeless _1 PHQ - 2 Score _2 Altered sleeping 3 0 _3 Tired, decreased energy 3 0 0 2 2  Change in appetite 3 0 2 0 1  Feeling bad or failure about yourself  1 0 0 1 1  Trouble concentrating 3 0 _4 Moving slowly or fidgety/restless 0 0 0 0 0  Suicidal thoughts 0 0 0 0 0  PHQ-9 Score _5 Difficult doing work/chores  Not difficult at all   Not difficult at all    phq 9 is positive   Fall Risk:    10/21/2022    8:40 AM 10/04/2022    3:38 PM 08/23/2022   10:10 AM 04/05/2022    2:08 PM 12/21/2021    3:16 PM  Fall Risk   Falls in the past year? _6 Number falls in past yr: _7 Injury with Fall? 0 0 0 1 0  Risk for fall due to : No Fall Risks  No Fall Risks No Fall Risks History of fall(s)  Follow up Falls prevention discussed  Falls prevention discussed Falls prevention discussed Falls evaluation completed;Education provided;Falls prevention discussed      Functional Status Survey: Is the patient deaf or have difficulty hearing?: No Does the patient have difficulty seeing, even when wearing glasses/contacts?: No Does the patient have difficulty concentrating, remembering, or making decisions?: Yes Does the patient have difficulty walking or climbing stairs?: No Does the patient have difficulty dressing or bathing?: No Does the patient have difficulty doing errands alone such as visiting a doctor's office or shopping?: No    Assessment & Plan   1. Hospital discharge follow-up   2. Post-ERCP acute pancreatitis  Keep follow up with GI next week  3. Mild protein-calorie malnutrition ([place])   Progressed diet to add some protein as tolerated   4. Essential hypertension  Please hold losartan hctz until BP goes above 140/90 - take half pill if borderline high but if goes above 150/90 may take a full pill

## 2022-10-21 ENCOUNTER — Ambulatory Visit: Payer: Managed Care, Other (non HMO) | Admitting: Family Medicine

## 2022-10-21 ENCOUNTER — Encounter: Payer: Self-pay | Admitting: Family Medicine

## 2022-10-21 VITALS — BP 132/76 | HR 107 | Resp 16 | Ht 62.0 in | Wt 180.0 lb

## 2022-10-21 DIAGNOSIS — I10 Essential (primary) hypertension: Secondary | ICD-10-CM

## 2022-10-21 DIAGNOSIS — E441 Mild protein-calorie malnutrition: Secondary | ICD-10-CM | POA: Insufficient documentation

## 2022-10-21 DIAGNOSIS — K9189 Other postprocedural complications and disorders of digestive system: Secondary | ICD-10-CM | POA: Diagnosis not present

## 2022-10-21 DIAGNOSIS — Z09 Encounter for follow-up examination after completed treatment for conditions other than malignant neoplasm: Secondary | ICD-10-CM | POA: Diagnosis not present

## 2022-10-21 DIAGNOSIS — K859 Acute pancreatitis without necrosis or infection, unspecified: Secondary | ICD-10-CM

## 2022-10-21 NOTE — Patient Instructions (Signed)
Check bp at home daily and only take losartan hctz if bp is above 140/90, you can take a half pill at first and if remains above 140/90 you can try a whole pill

## 2022-11-08 ENCOUNTER — Telehealth: Payer: Self-pay

## 2022-11-08 ENCOUNTER — Other Ambulatory Visit: Payer: Self-pay | Admitting: Family Medicine

## 2022-11-08 DIAGNOSIS — Z8659 Personal history of other mental and behavioral disorders: Secondary | ICD-10-CM

## 2022-11-08 MED ORDER — DIAZEPAM 5 MG PO TABS: 5.0000 mg | ORAL_TABLET | Freq: Every day | ORAL | 0 refills | Status: DC

## 2022-11-08 NOTE — Telephone Encounter (Signed)
Patient is requesting an rx for her upcoming CT due to claustrophobia. Pharmacy is [facility] in [place].

## 2022-11-08 NOTE — Telephone Encounter (Signed)
Patient notified

## 2022-11-28 NOTE — Progress Notes (Unsigned)
Name: Tammy Swanson   MRN: [id number]    DOB: 1966/07/18   Date:11/29/2022       Progress Note  Subjective  Chief Complaint  Follow Up  HPI  History of pancreatitis: hospitalized October 27 th for 10 days, she is  under the care of [facility], had repeat CT done 12/02 ( results below ) she is still eating a bland diet, new incidental findings of possible kidney infarct versus infiltrate, she will go back for a repeat CT in January  She still has some abdominal discomfort, denies nausea, she is following a bland diet.   IMPRESSION:11/12/2022  --Decreased size of low-attenuation peripancreatic collection along the uncinate process now measuring 4.0 cm. No new collections identified. --Nonspecific ill-defined area of hypoenhancement within the lower pole of the right kidney measuring up to 3.5 cm not present on prior. Findings could be secondary to a renal infarct, or infiltrative mass lesion, however given the short interval this is felt to be less likely. This could also be secondary to infection in the appropriate clinical setting. If there is clinical concern for infection, correlation with urinalysis is recommended. Recommend follow-up CT of the abdomen in 1 month.   Depression Mild Recurrent: she was   going to a [place] for therapy however lost to follow up due to change in insurance . Husband asked to separate from her in July 22- he told her he was cheating on her ( he lost his job this past Spring) daughter admitted in October 22  and diagnosed with Bipolar and is stable now.  Husband and I still live in the same house but not as a couple.  Currently only taking Effexor 225 mg daily    Headaches: she has a long  history of migraines as an young adult, she was doing well, however last Fall 22 she noticed  symptoms returned. We tried Topamax and it was working but it caused brain fog and because of severe symptoms  she was referred to Dr. Melrose Nakayama, she is now off Topamax and taking  Nortriptyline 50 mg every night , higher dose of Effexor, Nadolol and Ubrelvy three times a week ( but not covered by insurance so she is currently only using it prn ) Migraine is usually throbbing, severe, usually right side of head. She stopped Imitrex since it is not as efficacious and also does not like side effects. Still not taking Ubrelfy  HTN: bp is at goal. She denies side effects of medication. She denies chest pain or  palpitation. She is skipping doses but took medication last night, advised to take it daily. Taking losartan hctz and also nadolol   Pre-diabetes: no polyphagia, polydipsia or polyuria.  Vitamin D and B12 deficiency: doing well on otc supplementation    GERD/Chronic gastritis: she is feeling much better since taking Omeprazole - and symptoms are controlled . Unchanged   Asthma moderate persistent  she states present in the Spring and Fall.  She has wheezing and SOB , no cough.   Obesity Morbid: BMI above 35  with co-morbidities such as GERD, HTN and pre-diabetes , she would like to start medication for weight loss, discussed GLP-1 agonist, however she has a personal history of pancreatitis she states it was secondary to gallstones and took trulicity in the past without problems.   Patient Active Problem List   Diagnosis Date Noted   Abnormal finding on diagnostic imaging of right kidney 11/29/2022   Moderate persistent asthma without complication 26/71/2458  Vitamin D deficiency 11/29/2022   B12 deficiency 11/29/2022   Mild protein-calorie malnutrition ([place]) 10/21/2022   Post-ERCP acute pancreatitis 10/21/2022   History of pancreatitis 08/23/2022   Chronic superficial gastritis without bleeding 08/23/2022   Migraine with aura and without status migrainosus, not intractable 08/23/2022   Moderate episode of recurrent major depressive disorder ([place]) 08/23/2022   Hypercalcemia 08/23/2022   Polyp of transverse colon    Transient confusion 05/25/2020   Headache  disorder 10/22/2019   Sleep disorder 10/22/2019   History of carpal tunnel surgery of right wrist 12/06/2016   Prediabetes 10/15/2016   History of liver failure 08/24/2016   Migraine without aura and without status migrainosus, not intractable 09/10/2015   Eczema 09/10/2015   Bad memory 06/01/2015   Menopause 06/01/2015   Cough variant asthma 06/01/2015   Obesity (BMI 30-39.9) 06/01/2015   Nodule, subcutaneous 06/01/2015   Lesion of ulnar nerve 06/01/2015   Vitiligo 06/01/2015   Essential hypertension 09/03/2010    Past Surgical History:  Procedure Laterality Date   ABDOMINAL EXPLORATION SURGERY     x3   ABDOMINAL HYSTERECTOMY  2013   ABDOMINAL HYSTERECTOMY  2014   CARPAL TUNNEL RELEASE Right 12/06/2016   Procedure: CARPAL TUNNEL RELEASE;  Surgeon: Hessie Knows, MD;  Location: [facility];  Service: Orthopedics;  Laterality: Right;   CHOLECYSTECTOMY     COLONOSCOPY WITH PROPOFOL N/A 07/19/2021   Procedure: COLONOSCOPY WITH BIOPSY;  Surgeon: Lucilla Lame, MD;  Location: [place];  Service: Endoscopy;  Laterality: N/A;   ESOPHAGOGASTRODUODENOSCOPY (EGD) WITH PROPOFOL N/A 07/19/2021   Procedure: ESOPHAGOGASTRODUODENOSCOPY (EGD) WITH BIOPSY;  Surgeon: Lucilla Lame, MD;  Location: [place];  Service: Endoscopy;  Laterality: N/A;  Latex   FOOT SURGERY     FOOT SURGERY Left 2007/2009   Dr. Elvina Mattes   KNEE ARTHROSCOPY Left 1985   KNEE SURGERY Left    MANDIBLE FRACTURE SURGERY     NASAL SINUS SURGERY     NASAL SINUS SURGERY  2012   POLYPECTOMY N/A 07/19/2021   Procedure: POLYPECTOMY;  Surgeon: Lucilla Lame, MD;  Location: [place];  Service: Endoscopy;  Laterality: N/A;   RECONSTRUCTION MANDIBLE / MAXILLA  1992   TONSILLECTOMY     TONSILLECTOMY  1981   TRIGGER FINGER RELEASE      Family History  Problem Relation Age of Onset   Hypertension Mother    Cancer Mother        Breast, colon   HIV/AIDS Mother    Breast cancer Mother    Cancer Father         lung cancer   Hypertension Sister    Hypertension Maternal Grandmother    Hypertension Maternal Grandfather    Bipolar disorder Daughter    Cancer Maternal Aunt        3 sisters with Breast Cancer   Breast cancer Maternal Aunt    Breast cancer Maternal Aunt    Cancer Paternal Aunt    Cancer Paternal Uncle     Social History   Tobacco Use   Smoking status: Never   Smokeless tobacco: Never  Substance Use Topics   Alcohol use: Yes    Comment: occasionally     Current Outpatient Medications:    albuterol (VENTOLIN HFA) 108 (90 Base) MCG/ACT inhaler, TAKE 2 PUFFS BY MOUTH EVERY 6 HOURS AS NEEDED FOR WHEEZE OR SHORTNESS OF BREATH, Disp: 18 each, Rfl: 0   cholecalciferol (VITAMIN D) 1000 units tablet, Take 1,000 Units by mouth daily., Disp: ,  Rfl:    Cyanocobalamin (B-12) 1000 MCG SUBL, Place 1 tablet under the tongue daily., Disp: , Rfl:    estradiol (VIVELLE-DOT) 0.05 MG/24HR patch, APPLY 1 PATCH TWICE WEEKLY, Disp: 24 patch, Rfl: 0   Fluticasone-Umeclidin-Vilant (TRELEGY ELLIPTA) 100-62.5-25 MCG/ACT AEPB, Inhale 1 puff into the lungs daily., Disp: 3 each, Rfl: 1   MAGNESIUM PO, Take by mouth., Disp: , Rfl:    nortriptyline (PAMELOR) 50 MG capsule, Take 50 mg by mouth at bedtime., Disp: , Rfl:    UBRELVY 100 MG TABS, Take 100 mg by mouth., Disp: , Rfl:    venlafaxine (EFFEXOR) 75 MG tablet, Take 225 mg by mouth daily at 12 noon., Disp: , Rfl:    diazepam (VALIUM) 5 MG tablet, Take 1-2 tablets (5-10 mg total) by mouth daily at 12 noon. 30 minutes before procedure and may repeat once more if need when procedure is about to start, Disp: 2 tablet, Rfl: 0   losartan-hydrochlorothiazide (HYZAAR) 100-12.5 MG tablet, Take 1 tablet by mouth daily., Disp: 90 tablet, Rfl: 1   montelukast (SINGULAIR) 10 MG tablet, TAKE 1 TABLET BY MOUTH EVERYDAY AT BEDTIME, Disp: 90 tablet, Rfl: 1   nadolol (CORGARD) 20 MG tablet, Take 1 tablet (20 mg total) by mouth daily. In place of Atenolol, Disp: 90 tablet,  Rfl: 1   omeprazole (PRILOSEC) 20 MG capsule, TAKE 1 CAPSULE BY MOUTH EVERY DAY BEFORE BREAKFAST, Disp: 90 capsule, Rfl: 1  Allergies  Allergen Reactions   Latex Anaphylaxis   Belviq [Lorcaserin Hcl]     Dry mouth, palpitation   Tomato Hives    "Cherry tomatoes"   Vicodin [Hydrocodone-Acetaminophen] Hives   Imitrex [Sumatriptan] Palpitations    Intolerance     I personally reviewed active problem list, medication list, allergies, family history, social history, health maintenance with the patient/caregiver today.   ROS  Constitutional: Negative for fever or weight change.  Respiratory: Negative for cough and shortness of breath.   Cardiovascular: Negative for chest pain or palpitations.  Gastrointestinal: positive  for abdominal pain, no bowel changes.  Musculoskeletal: Negative for gait problem or joint swelling.  Skin: Negative for rash.  Neurological: Negative for dizziness , positive for intermittent headache.  No other specific complaints in a complete review of systems (except as listed in HPI above).   Objective  Vitals:   11/29/22 1310  BP: 122/76  Pulse: 81  Resp: 16  SpO2: 98%  Weight: 176 lb (79.8 kg)  Height: _0  (1.575 m)    Body mass index is 32.19 kg/m.  Physical Exam  Constitutional: Patient appears well-developed and well-nourished. Obese  No distress.  HEENT: head atraumatic, normocephalic, pupils equal and reactive to light,, neck supple Cardiovascular: Normal rate, regular rhythm and normal heart sounds.  No murmur heard. No BLE edema. Pulmonary/Chest: Effort normal and breath sounds normal. No respiratory distress. Abdominal: Soft.  There is epigastric tenderness  Psychiatric: Patient has a normal mood and affect. behavior is normal. Judgment and thought content normal.   Recent Results (from the past 2160 hour(s))  COMPLETE METABOLIC PANEL WITH GFR     Status: Abnormal   Collection Time: 10/04/22  3:56 PM  Result Value Ref Range    Glucose, Bld 91 65 - 99 mg/dL    Comment: .            Fasting reference interval .    BUN 9 7 - 25 mg/dL   Creat 0.81 0.50 - 1.03 mg/dL   eGFR 84 > OR =  60 mL/min/1.41m   BUN/Creatinine Ratio SEE NOTE: 6 - 22 (calc)    Comment:    Not Reported: BUN and Creatinine are within    reference range. .    Sodium 144 135 - 146 mmol/L   Potassium 4.5 3.5 - 5.3 mmol/L   Chloride 103 98 - 110 mmol/L   CO2 24 20 - 32 mmol/L   Calcium 10.1 8.6 - 10.4 mg/dL   Total Protein 8.2 (H) 6.1 - 8.1 g/dL   Albumin 3.9 3.6 - 5.1 g/dL   Globulin 4.3 (H) 1.9 - 3.7 g/dL (calc)   AG Ratio 0.9 (L) 1.0 - 2.5 (calc)   Total Bilirubin 0.8 0.2 - 1.2 mg/dL   Alkaline phosphatase (APISO) 111 37 - 153 U/L   AST 39 (H) 10 - 35 U/L   ALT 38 (H) 6 - 29 U/L  Lipase     Status: Abnormal   Collection Time: 10/04/22  3:56 PM  Result Value Ref Range   Lipase 212 (H) 7 - 60 U/L    Comment: Verified by repeat analysis. .Marland Kitchen  CBC w/Diff/Platelet     Status: Abnormal   Collection Time: 10/04/22  3:56 PM  Result Value Ref Range   WBC 7.4 3.8 - 10.8 Thousand/uL   RBC 4.88 3.80 - 5.10 Million/uL   Hemoglobin 13.4 11.7 - 15.5 g/dL   HCT 40.7 35.0 - 45.0 %   MCV 83.4 80.0 - 100.0 fL   MCH 27.5 27.0 - 33.0 pg   MCHC 32.9 32.0 - 36.0 g/dL   RDW 13.1 11.0 - 15.0 %   Platelets 685 (H) 140 - 400 Thousand/uL   MPV 10.0 7.5 - 12.5 fL   Neutro Abs 4,906 1,500 - 7,800 cells/uL   Lymphs Abs 1,702 850 - 3,900 cells/uL   Absolute Monocytes 511 200 - 950 cells/uL   Eosinophils Absolute 222 15 - 500 cells/uL   Basophils Absolute 59 0 - 200 cells/uL   Neutrophils Relative % 66.3 %   Total Lymphocyte 23.0 %   Monocytes Relative 6.9 %   Eosinophils Relative 3.0 %   Basophils Relative 0.8 %    PHQ2/9:    11/29/2022    1:20 PM 10/21/2022    8:51 AM 10/04/2022    3:38 PM 08/23/2022   10:11 AM 04/05/2022    2:08 PM  Depression screen PHQ 2/9  Decreased Interest _0 Down, Depressed, Hopeless _1 PHQ - 2 Score  _2 Altered sleeping 1 3 0 2 2  Tired, decreased energy 1 3 0 0 2  Change in appetite 0 3 0 2 0  Feeling bad or failure about yourself  1 1 0 0 1  Trouble concentrating 2 3 0 2 3  Moving slowly or fidgety/restless 0 0 0 0 0  Suicidal thoughts 0 0 0 0 0  PHQ-9 Score _3 Difficult doing work/chores   Not difficult at all      phq 9 is positive   Fall Risk:    11/29/2022    1:09 PM 10/21/2022    8:40 AM 10/04/2022    3:38 PM 08/23/2022   10:10 AM 04/05/2022    2:08 PM  Fall Risk   Falls in the past year? _4 Number falls in past yr: _5 Injury with Fall? 0 0 0 0 1  Risk for fall due to : No Fall Risks No Fall Risks  No Fall Risks No Fall Risks  Follow up Falls prevention discussed Falls prevention discussed  Falls prevention discussed Falls prevention discussed      Functional Status Survey: Is the patient deaf or have difficulty hearing?: No Does the patient have difficulty seeing, even when wearing glasses/contacts?: No Does the patient have difficulty concentrating, remembering, or making decisions?: Yes Does the patient have difficulty walking or climbing stairs?: No Does the patient have difficulty dressing or bathing?: No Does the patient have difficulty doing errands alone such as visiting a doctor's office or shopping?: No    Assessment & Plan  1. Mild persistent asthma without complication  - montelukast (SINGULAIR) 10 MG tablet; TAKE 1 TABLET BY MOUTH EVERYDAY AT BEDTIME  Dispense: 90 tablet; Refill: 1  2. Essential hypertension  - losartan-hydrochlorothiazide (HYZAAR) 100-12.5 MG tablet; Take 1 tablet by mouth daily.  Dispense: 90 tablet; Refill: 1 - nadolol (CORGARD) 20 MG tablet; Take 1 tablet (20 mg total) by mouth daily. In place of Atenolol  Dispense: 90 tablet; Refill: 1  3. Chronic superficial gastritis without bleeding  - omeprazole (PRILOSEC) 20 MG capsule; TAKE 1 CAPSULE BY MOUTH EVERY DAY BEFORE BREAKFAST   Dispense: 90 capsule; Refill: 1  4. Need for Tdap vaccination  - Tdap vaccine greater than or equal to 7yo IM  5. Migraine with aura and without status migrainosus, not intractable  Continue follow up with Dr. Melrose Nakayama  6. Moderate episode of recurrent major depressive disorder ([place])  Continue effexor   7. B12 deficiency   8. Prediabetes   9. Vitamin D deficiency   10. History of pancreatitis   11. Breast cancer screening by mammogram  - MM DIAG BREAST TOMO BILATERAL; Future  12. Abnormal finding on diagnostic imaging of right kidney   [facility]

## 2022-11-29 ENCOUNTER — Ambulatory Visit (INDEPENDENT_AMBULATORY_CARE_PROVIDER_SITE_OTHER): Payer: Managed Care, Other (non HMO) | Admitting: Family Medicine

## 2022-11-29 ENCOUNTER — Encounter: Payer: Self-pay | Admitting: Family Medicine

## 2022-11-29 VITALS — BP 122/76 | HR 81 | Resp 16 | Ht 62.0 in | Wt 176.0 lb

## 2022-11-29 DIAGNOSIS — I10 Essential (primary) hypertension: Secondary | ICD-10-CM

## 2022-11-29 DIAGNOSIS — E538 Deficiency of other specified B group vitamins: Secondary | ICD-10-CM

## 2022-11-29 DIAGNOSIS — E559 Vitamin D deficiency, unspecified: Secondary | ICD-10-CM

## 2022-11-29 DIAGNOSIS — Z23 Encounter for immunization: Secondary | ICD-10-CM

## 2022-11-29 DIAGNOSIS — R7303 Prediabetes: Secondary | ICD-10-CM

## 2022-11-29 DIAGNOSIS — J4540 Moderate persistent asthma, uncomplicated: Secondary | ICD-10-CM | POA: Insufficient documentation

## 2022-11-29 DIAGNOSIS — G43109 Migraine with aura, not intractable, without status migrainosus: Secondary | ICD-10-CM

## 2022-11-29 DIAGNOSIS — Z8659 Personal history of other mental and behavioral disorders: Secondary | ICD-10-CM

## 2022-11-29 DIAGNOSIS — F331 Major depressive disorder, recurrent, moderate: Secondary | ICD-10-CM | POA: Diagnosis not present

## 2022-11-29 DIAGNOSIS — K2930 Chronic superficial gastritis without bleeding: Secondary | ICD-10-CM | POA: Diagnosis not present

## 2022-11-29 DIAGNOSIS — Z1231 Encounter for screening mammogram for malignant neoplasm of breast: Secondary | ICD-10-CM

## 2022-11-29 DIAGNOSIS — J4530 Mild persistent asthma, uncomplicated: Secondary | ICD-10-CM

## 2022-11-29 DIAGNOSIS — R93421 Abnormal radiologic findings on diagnostic imaging of right kidney: Secondary | ICD-10-CM

## 2022-11-29 DIAGNOSIS — Z8719 Personal history of other diseases of the digestive system: Secondary | ICD-10-CM

## 2022-11-29 MED ORDER — DIAZEPAM 5 MG PO TABS: 5.0000 mg | ORAL_TABLET | Freq: Every day | ORAL | 0 refills | Status: DC

## 2022-11-29 MED ORDER — NADOLOL 20 MG PO TABS: 20.0000 mg | ORAL_TABLET | Freq: Every day | ORAL | 1 refills | Status: DC

## 2022-11-29 MED ORDER — OMEPRAZOLE 20 MG PO CPDR: DELAYED_RELEASE_CAPSULE | ORAL | 1 refills | Status: DC

## 2022-11-29 MED ORDER — MONTELUKAST SODIUM 10 MG PO TABS: ORAL_TABLET | ORAL | 1 refills | Status: DC

## 2022-11-29 MED ORDER — LOSARTAN POTASSIUM-HCTZ 100-12.5 MG PO TABS: 1.0000 | ORAL_TABLET | Freq: Every day | ORAL | 1 refills | Status: DC

## 2022-12-06 ENCOUNTER — Other Ambulatory Visit: Payer: Self-pay | Admitting: Family Medicine

## 2022-12-06 ENCOUNTER — Telehealth: Payer: Self-pay | Admitting: Family Medicine

## 2022-12-06 DIAGNOSIS — R8290 Unspecified abnormal findings in urine: Secondary | ICD-10-CM

## 2022-12-06 DIAGNOSIS — R93429 Abnormal radiologic findings on diagnostic imaging of unspecified kidney: Secondary | ICD-10-CM

## 2022-12-06 NOTE — Telephone Encounter (Signed)
Copied from [place] [id number]. Topic: General - Other >> Dec 02, 2022 12:32 PM Everette C wrote: Reason for CRM: Dr. Juanita Laster with [facility] has called to request peer to peer contact with Dr Ancil Boozer regarding the patient   Dr Ella Jubilee can be reached at [phone]

## 2022-12-09 LAB — CBC WITH DIFFERENTIAL/PLATELET
Basophils Absolute: 0.1 10*3/uL (ref 0.0–0.2)
Basos: 1 %
EOS (ABSOLUTE): 0.2 10*3/uL (ref 0.0–0.4)
Eos: 3 %
Hematocrit: 38.9 % (ref 34.0–46.6)
Hemoglobin: 12.9 g/dL (ref 11.1–15.9)
Immature Grans (Abs): 0 10*3/uL (ref 0.0–0.1)
Immature Granulocytes: 0 %
Lymphocytes Absolute: 1.9 10*3/uL (ref 0.7–3.1)
Lymphs: 25 %
MCH: 27.1 pg (ref 26.6–33.0)
MCHC: 33.2 g/dL (ref 31.5–35.7)
MCV: 82 fL (ref 79–97)
Monocytes Absolute: 0.5 10*3/uL (ref 0.1–0.9)
Monocytes: 7 %
Neutrophils Absolute: 4.8 10*3/uL (ref 1.4–7.0)
Neutrophils: 64 %
Platelets: 376 10*3/uL (ref 150–450)
RBC: 4.76 x10E6/uL (ref 3.77–5.28)
RDW: 12.8 % (ref 11.7–15.4)
WBC: 7.6 10*3/uL (ref 3.4–10.8)

## 2022-12-09 LAB — COMP. METABOLIC PANEL (12)
AST: 44 IU/L — ABNORMAL HIGH (ref 0–40)
Albumin/Globulin Ratio: 1.3 (ref 1.2–2.2)
Albumin: 4.1 g/dL (ref 3.8–4.9)
Alkaline Phosphatase: 125 IU/L — ABNORMAL HIGH (ref 44–121)
BUN/Creatinine Ratio: 13 (ref 9–23)
BUN: 11 mg/dL (ref 6–24)
Bilirubin Total: 0.3 mg/dL (ref 0.0–1.2)
Calcium: 9.8 mg/dL (ref 8.7–10.2)
Chloride: 105 mmol/L (ref 96–106)
Creatinine, Ser: 0.88 mg/dL (ref 0.57–1.00)
Globulin, Total: 3.1 g/dL (ref 1.5–4.5)
Glucose: 94 mg/dL (ref 70–99)
Potassium: 4.1 mmol/L (ref 3.5–5.2)
Sodium: 141 mmol/L (ref 134–144)
Total Protein: 7.2 g/dL (ref 6.0–8.5)
eGFR: 76 mL/min/{1.73_m2} (ref >59)

## 2022-12-13 ENCOUNTER — Ambulatory Visit: Payer: Commercial Managed Care - PPO | Admitting: Obstetrics and Gynecology

## 2022-12-16 ENCOUNTER — Other Ambulatory Visit: Payer: Self-pay | Admitting: Family Medicine

## 2022-12-16 ENCOUNTER — Telehealth: Payer: Self-pay | Admitting: Family Medicine

## 2022-12-16 DIAGNOSIS — K859 Acute pancreatitis without necrosis or infection, unspecified: Secondary | ICD-10-CM

## 2022-12-16 DIAGNOSIS — K869 Disease of pancreas, unspecified: Secondary | ICD-10-CM

## 2022-12-16 DIAGNOSIS — R93429 Abnormal radiologic findings on diagnostic imaging of unspecified kidney: Secondary | ICD-10-CM

## 2022-12-16 NOTE — Telephone Encounter (Unsigned)
Copied from [place] [id number]. Topic: General - Other >> Dec 16, 2022 11:06 AM Cyndi Bender wrote: Reason for CRM: Robin with [facility] reports that the patient's insurance will not cover an appt with [facility] so she would like to ask that the CAT scan be ordered by pcp and sent to a location that accepts [employer]. Cb# [phone]

## 2022-12-19 ENCOUNTER — Telehealth: Payer: Self-pay | Admitting: *Deleted

## 2022-12-19 NOTE — Telephone Encounter (Signed)
Result note read to pt, verbalizes understanding.  Per Dr. Ancil Boozer:  "Normal glucose and kidney function tests. There was mild elevation of liver enzymes but not significant Normal white count and no anemia Left voice message to Dr. Ella Jubilee letting her know kidney function was normal"

## 2022-12-23 ENCOUNTER — Ambulatory Visit: Payer: Managed Care, Other (non HMO) | Admitting: Urology

## 2022-12-26 ENCOUNTER — Telehealth: Payer: Self-pay | Admitting: Family Medicine

## 2022-12-26 ENCOUNTER — Ambulatory Visit: Admission: RE | Admit: 2022-12-26 | Payer: Managed Care, Other (non HMO) | Source: Ambulatory Visit

## 2022-12-26 NOTE — Telephone Encounter (Signed)
Patient called and everything was explained to the patient regarding both [facility] and [facility] being out of network with her insurance. She stated that Bailey Mech called her and suggested that she call [facility] to get scheduled. She did not at the time b/c she was unaware that her previously schedule appt was cancelled. I gave her [facility]'s number and she stated that she will give them a call.

## 2022-12-26 NOTE — Telephone Encounter (Signed)
Copied from [place] [id number]. Topic: General - Other >> Dec 26, 2022  9:29 AM Chapman Fitch wrote: Reason for CRM: Pt called to see why her CT scan was cancelled / please advise

## 2022-12-26 NOTE — Telephone Encounter (Signed)
I called patient to inform her of the situation with the approval of her CT and the conversation between [facility] rep) but there was no answer.  A message was left with my direct line for her to give me a call at her convenience.

## 2023-01-04 LAB — HM MAMMOGRAPHY

## 2023-01-06 ENCOUNTER — Ambulatory Visit: Payer: Managed Care, Other (non HMO) | Admitting: Urology

## 2023-01-30 ENCOUNTER — Encounter: Payer: Self-pay | Admitting: Urology

## 2023-01-30 ENCOUNTER — Ambulatory Visit: Payer: Managed Care, Other (non HMO) | Admitting: Urology

## 2023-01-30 VITALS — BP 173/111 | HR 82 | Ht 62.0 in | Wt 176.0 lb

## 2023-01-30 DIAGNOSIS — R93429 Abnormal radiologic findings on diagnostic imaging of unspecified kidney: Secondary | ICD-10-CM | POA: Diagnosis not present

## 2023-01-30 DIAGNOSIS — N289 Disorder of kidney and ureter, unspecified: Secondary | ICD-10-CM

## 2023-01-30 DIAGNOSIS — R319 Hematuria, unspecified: Secondary | ICD-10-CM

## 2023-01-30 LAB — URINALYSIS, COMPLETE
Bilirubin, UA: NEGATIVE
Glucose, UA: NEGATIVE
Ketones, UA: NEGATIVE
Leukocytes,UA: NEGATIVE
Nitrite, UA: NEGATIVE
Protein,UA: NEGATIVE
RBC, UA: NEGATIVE
Specific Gravity, UA: 1.025 (ref 1.005–1.030)
Urobilinogen, Ur: 0.2 mg/dL (ref 0.2–1.0)
pH, UA: 5 (ref 5.0–7.5)

## 2023-01-30 LAB — MICROSCOPIC EXAMINATION: Epithelial Cells (non renal): >10 /hpf — AB (ref 0–10)

## 2023-01-30 NOTE — Progress Notes (Unsigned)
01/30/2023 10:09 AM   Tammy Swanson 08/21/1966 [id number]  Referring provider: Steele Sizer, MD [address] [place],  [place] [address]  Chief Complaint  Patient presents with   New Patient (Initial Visit)    HPI: Tammy Swanson is a 58 y.o. female referred for evaluation of an abnormal CT.  Was being followed for pancreatitis and had a CT at [facility] 11/12/2022 which incidentally showed an ill-defined area of hypoenhancement in the lower pole of the right kidney measuring 3.5 cm not present on prior imaging. A follow-up CT performed at [facility] 12/28/2022 was interpreted as normal without renal lesions.  Over read of the CT at [facility] 01/05/2023 was felt showed resolution of the previously noted right area however a new region of hypoenhancement was seen in the left posterior kidney She denied flank pain, voiding symptoms or gross hematuria   PMH: Past Medical History:  Diagnosis Date   Anxiety    Arthritis    KNEE-LEFT   Asthma    WELL-CONTROLLED   Complication of anesthesia    ASTHMA ATTACK DURING SURGERY X 3   Headache    H/O MIGRAINES   Hypertension    Liver failure ([place]) 1994   PT HAD GALLSTONES-PT ENDED UP WITH PANCREATITIS AND THEN ENDED UP IN LIVER FAILURE   Obesity    Pain in joint involving upper arm    Pancreatitis    Paresthesia of thumb of right hand    PONV (postoperative nausea and vomiting)    Vertigo    no episodes > 1 yr   Vitamin D deficiency    Vitiligo    Wears dentures    Partial lower    Surgical History: Past Surgical History:  Procedure Laterality Date   ABDOMINAL EXPLORATION SURGERY     x3   ABDOMINAL HYSTERECTOMY  2013   ABDOMINAL HYSTERECTOMY  2014   CARPAL TUNNEL RELEASE Right 12/06/2016   Procedure: CARPAL TUNNEL RELEASE;  Surgeon: Hessie Knows, MD;  Location: [facility];  Service: Orthopedics;  Laterality: Right;   CHOLECYSTECTOMY     COLONOSCOPY WITH PROPOFOL N/A 07/19/2021   Procedure: COLONOSCOPY WITH  BIOPSY;  Surgeon: Lucilla Lame, MD;  Location: [place];  Service: Endoscopy;  Laterality: N/A;   ESOPHAGOGASTRODUODENOSCOPY (EGD) WITH PROPOFOL N/A 07/19/2021   Procedure: ESOPHAGOGASTRODUODENOSCOPY (EGD) WITH BIOPSY;  Surgeon: Lucilla Lame, MD;  Location: [place];  Service: Endoscopy;  Laterality: N/A;  Latex   FOOT SURGERY     FOOT SURGERY Left 2007/2009   Dr. Elvina Mattes   KNEE ARTHROSCOPY Left 1985   KNEE SURGERY Left    MANDIBLE FRACTURE SURGERY     NASAL SINUS SURGERY     NASAL SINUS SURGERY  2012   POLYPECTOMY N/A 07/19/2021   Procedure: POLYPECTOMY;  Surgeon: Lucilla Lame, MD;  Location: [place];  Service: Endoscopy;  Laterality: N/A;   RECONSTRUCTION MANDIBLE / MAXILLA  1992   TONSILLECTOMY     TONSILLECTOMY  1981   TRIGGER FINGER RELEASE      Home Medications:  Allergies as of 01/30/2023       Reactions   Latex Anaphylaxis   Belviq [lorcaserin Hcl]    Dry mouth, palpitation   Tomato Hives   "Cherry tomatoes"   Vicodin [hydrocodone-acetaminophen] Hives   Imitrex [sumatriptan] Palpitations   Intolerance         Medication List        Accurate as of January 30, 2023 10:09 AM. If you have any  questions, ask your nurse or doctor.          STOP taking these medications    diazepam 5 MG tablet Commonly known as: VALIUM Stopped by: Abbie Sons, MD   MAGNESIUM PO Stopped by: Abbie Sons, MD       TAKE these medications    albuterol 108 (90 Base) MCG/ACT inhaler Commonly known as: VENTOLIN HFA TAKE 2 PUFFS BY MOUTH EVERY 6 HOURS AS NEEDED FOR WHEEZE OR SHORTNESS OF BREATH   B-12 1000 MCG Subl Place 1 tablet under the tongue daily.   cholecalciferol 1000 units tablet Commonly known as: VITAMIN D Take 1,000 Units by mouth daily.   estradiol 0.05 MG/24HR patch Commonly known as: VIVELLE-DOT APPLY 1 PATCH TWICE WEEKLY   losartan-hydrochlorothiazide 100-12.5 MG tablet Commonly known as: HYZAAR Take 1 tablet by  mouth daily.   montelukast 10 MG tablet Commonly known as: SINGULAIR TAKE 1 TABLET BY MOUTH EVERYDAY AT BEDTIME   nadolol 20 MG tablet Commonly known as: CORGARD Take 1 tablet (20 mg total) by mouth daily. In place of Atenolol   nortriptyline 50 MG capsule Commonly known as: PAMELOR Take 50 mg by mouth at bedtime.   omeprazole 20 MG capsule Commonly known as: PRILOSEC TAKE 1 CAPSULE BY MOUTH EVERY DAY BEFORE BREAKFAST   Trelegy Ellipta 100-62.5-25 MCG/ACT Aepb Generic drug: Fluticasone-Umeclidin-Vilant Inhale 1 puff into the lungs daily.   Ubrelvy 100 MG Tabs Generic drug: Ubrogepant Take 100 mg by mouth.   venlafaxine 75 MG tablet Commonly known as: EFFEXOR Take 225 mg by mouth daily at 12 noon.        Allergies:  Allergies  Allergen Reactions   Latex Anaphylaxis   Belviq [Lorcaserin Hcl]     Dry mouth, palpitation   Tomato Hives    "Cherry tomatoes"   Vicodin [Hydrocodone-Acetaminophen] Hives   Imitrex [Sumatriptan] Palpitations    Intolerance     Family History: Family History  Problem Relation Age of Onset   Hypertension Mother    Cancer Mother        Breast, colon   HIV/AIDS Mother    Breast cancer Mother    Cancer Father        lung cancer   Hypertension Sister    Hypertension Maternal Grandmother    Hypertension Maternal Grandfather    Bipolar disorder Daughter    Cancer Maternal Aunt        3 sisters with Breast Cancer   Breast cancer Maternal Aunt    Breast cancer Maternal Aunt    Cancer Paternal Aunt    Cancer Paternal Uncle     Social History:  reports that she has never smoked. She has never used smokeless tobacco. She reports current alcohol use. She reports that she does not use drugs.   Physical Exam: BP (!) 173/111   Pulse 82   Ht 5' 2"$  (1.575 m)   Wt 176 lb (79.8 kg)   BMI 32.19 kg/m   Constitutional:  Alert and oriented, No acute distress. HEENT: [place] AT Respiratory: Normal respiratory effort, no increased work of  breathing. Psychiatric: Normal mood and affect.  Laboratory Data:  Urinalysis Dipstick negative Microscopy >10 epis  Pertinent Imaging: CT images were personally reviewed and interpreted  Assessment & Plan:   Area of hypoenhancement right kidney on CT December 2023 Follow-up CT January 2024 showed resolution of the right renal abnormality however a new area of hypoattenuation in the left kidney Will schedule a follow-up CT ***  Abbie Sons, [place] [address], [place], [place] [address] [phone]

## 2023-02-27 NOTE — Progress Notes (Unsigned)
Name: Tammy Swanson   MRN: [id number]    DOB: 18-Oct-1966   Date:02/28/2023       Progress Note  Subjective  Chief Complaint  Follow Up  HPI  Chronic idiopathic pancreatis: hospitalized October 27 th for 10 days, she is  under the care of [facility], had repeat CT done 12/02 ( results below ) new incidental findings of possible kidney infarct versus infiltrate, she had repeat CT and still has fluids surrounding pancreas and another lesion on kidney and will go back for repeat studies. She continues to have intermittent abdominal pain, feels like she is hit by a horse intermittently. She is avoiding heavy meals, afraid of eating since symptoms triggered by eating. We will try Creon pancreatic enzymes   IMPRESSION:11/12/2022  --Decreased size of low-attenuation peripancreatic collection along the uncinate process now measuring 4.0 cm. No new collections identified. --Nonspecific ill-defined area of hypoenhancement within the lower pole of the right kidney measuring up to 3.5 cm not present on prior. Findings could be secondary to a renal infarct, or infiltrative mass lesion, however given the short interval this is felt to be less likely. This could also be secondary to infection in the appropriate clinical setting. If there is clinical concern for infection, correlation with urinalysis is recommended. Recommend follow-up CT of the abdomen in 1 month.   Depression Mild Recurrent: she was   going to a [facility] for therapy however lost to follow up due to change in insurance . Husband asked to separate from her in July 22- he told her he was cheating on her ( he lost his job this past Spring) daughter admitted in October 22  and diagnosed with Bipolar and is stable now , she is starting nursing program and [facility]  Husband and I still live in the same house but not as a couple. She is not sure of the dose of Effexor 225 mg daily    Headaches: she has a long  history of migraines as an young adult,  she was doing well, however last Fall 22 she noticed  symptoms returned. We tried Topamax and it was working but it caused brain fog and because of severe symptoms  she was referred to Dr. Melrose Nakayama, she is now off Topamax and taking Nortriptyline 50 mg every night , higher dose of Effexor, Nadolol and Ubrelvy three times a week ( but not covered by insurance so she is currently only using it prn ) Migraine is usually throbbing, severe, usually right side of head. She stopped Imitrex since it is not as efficacious and also does not like side effects. She needs to contact Dr. Melrose Nakayama to get medications filled   HTN: bp is at goal. She denies side effects of medication. She is taking Losartan hctz and Nadolol. She denies chest pain or  palpitation. BP is at goal   Pre-diabetes: no polyphagia, polydipsia or polyuria.  Vitamin D and B12 deficiency: doing well on otc supplementation  , we will recheck it next visit   GERD/Chronic gastritis: she is feeling much better since taking Omeprazole - and symptoms are controlled .She needs to take it daily   Asthma moderate persistent  she states present in the Spring and Fall.  She states since started on Trelegy symptoms have been controlled, no wheezing, cough or SOB  Obesity: : BMI  is now below 35, currently weight is stable, she had pancreatitis and lost weight but we will monitor it for now   Patient  Active Problem List   Diagnosis Date Noted   Abnormal finding on diagnostic imaging of right kidney 11/29/2022   Moderate persistent asthma without complication [id number]   Vitamin D deficiency 11/29/2022   B12 deficiency 11/29/2022   Mild protein-calorie malnutrition ([place]) 10/21/2022   Post-ERCP acute pancreatitis 10/21/2022   History of pancreatitis 08/23/2022   Chronic superficial gastritis without bleeding 08/23/2022   Migraine with aura and without status migrainosus, not intractable 08/23/2022   Moderate episode of recurrent major depressive  disorder ([place]) 08/23/2022   Hypercalcemia 08/23/2022   Polyp of transverse colon    Transient confusion 05/25/2020   Headache disorder 10/22/2019   Sleep disorder 10/22/2019   History of carpal tunnel surgery of right wrist 12/06/2016   Prediabetes 10/15/2016   History of liver failure 08/24/2016   Migraine without aura and without status migrainosus, not intractable 09/10/2015   Eczema 09/10/2015   Bad memory 06/01/2015   Menopause 06/01/2015   Cough variant asthma 06/01/2015   Obesity (BMI 30-39.9) 06/01/2015   Nodule, subcutaneous 06/01/2015   Lesion of ulnar nerve 06/01/2015   Vitiligo 06/01/2015   Essential hypertension 09/03/2010    Past Surgical History:  Procedure Laterality Date   ABDOMINAL EXPLORATION SURGERY     x3   ABDOMINAL HYSTERECTOMY  2013   ABDOMINAL HYSTERECTOMY  2014   CARPAL TUNNEL RELEASE Right 12/06/2016   Procedure: CARPAL TUNNEL RELEASE;  Surgeon: Hessie Knows, MD;  Location: [facility];  Service: Orthopedics;  Laterality: Right;   CHOLECYSTECTOMY     COLONOSCOPY WITH PROPOFOL N/A 07/19/2021   Procedure: COLONOSCOPY WITH BIOPSY;  Surgeon: Lucilla Lame, MD;  Location: [place];  Service: Endoscopy;  Laterality: N/A;   ESOPHAGOGASTRODUODENOSCOPY (EGD) WITH PROPOFOL N/A 07/19/2021   Procedure: ESOPHAGOGASTRODUODENOSCOPY (EGD) WITH BIOPSY;  Surgeon: Lucilla Lame, MD;  Location: [place];  Service: Endoscopy;  Laterality: N/A;  Latex   FOOT SURGERY     FOOT SURGERY Left 2007/2009   Dr. Elvina Mattes   KNEE ARTHROSCOPY Left 1985   KNEE SURGERY Left    MANDIBLE FRACTURE SURGERY     NASAL SINUS SURGERY     NASAL SINUS SURGERY  2012   POLYPECTOMY N/A 07/19/2021   Procedure: POLYPECTOMY;  Surgeon: Lucilla Lame, MD;  Location: [place];  Service: Endoscopy;  Laterality: N/A;   RECONSTRUCTION MANDIBLE / MAXILLA  1992   TONSILLECTOMY     TONSILLECTOMY  1981   TRIGGER FINGER RELEASE      Family History  Problem Relation Age of Onset    Hypertension Mother    Cancer Mother        Breast, colon   HIV/AIDS Mother    Breast cancer Mother    Cancer Father        lung cancer   Hypertension Sister    Hypertension Maternal Grandmother    Hypertension Maternal Grandfather    Bipolar disorder Daughter    Cancer Maternal Aunt        3 sisters with Breast Cancer   Breast cancer Maternal Aunt    Breast cancer Maternal Aunt    Cancer Paternal Aunt    Cancer Paternal Uncle     Social History   Tobacco Use   Smoking status: Never   Smokeless tobacco: Never  Substance Use Topics   Alcohol use: Yes    Comment: occasionally     Current Outpatient Medications:    albuterol (VENTOLIN HFA) 108 (90 Base) MCG/ACT inhaler, TAKE 2 PUFFS BY MOUTH EVERY 6 HOURS  AS NEEDED FOR WHEEZE OR SHORTNESS OF BREATH, Disp: 18 each, Rfl: 0   cholecalciferol (VITAMIN D) 1000 units tablet, Take 1,000 Units by mouth daily., Disp: , Rfl:    Cyanocobalamin (B-12) 1000 MCG SUBL, Place 1 tablet under the tongue daily., Disp: , Rfl:    estradiol (VIVELLE-DOT) 0.05 MG/24HR patch, APPLY 1 PATCH TWICE WEEKLY, Disp: 24 patch, Rfl: 0   Fluticasone-Umeclidin-Vilant (TRELEGY ELLIPTA) 100-62.5-25 MCG/ACT AEPB, Inhale 1 puff into the lungs daily., Disp: 3 each, Rfl: 1   losartan-hydrochlorothiazide (HYZAAR) 100-12.5 MG tablet, Take 1 tablet by mouth daily., Disp: 90 tablet, Rfl: 1   montelukast (SINGULAIR) 10 MG tablet, TAKE 1 TABLET BY MOUTH EVERYDAY AT BEDTIME, Disp: 90 tablet, Rfl: 1   nadolol (CORGARD) 20 MG tablet, Take 1 tablet (20 mg total) by mouth daily. In place of Atenolol, Disp: 90 tablet, Rfl: 1   nortriptyline (PAMELOR) 50 MG capsule, Take 50 mg by mouth at bedtime., Disp: , Rfl:    omeprazole (PRILOSEC) 20 MG capsule, TAKE 1 CAPSULE BY MOUTH EVERY DAY BEFORE BREAKFAST, Disp: 90 capsule, Rfl: 1   UBRELVY 100 MG TABS, Take 100 mg by mouth., Disp: , Rfl:    venlafaxine (EFFEXOR) 75 MG tablet, Take 225 mg by mouth daily at 12 noon., Disp: , Rfl:    Allergies  Allergen Reactions   Latex Anaphylaxis   Belviq [Lorcaserin Hcl]     Dry mouth, palpitation   Tomato Hives    "Cherry tomatoes"   Vicodin [Hydrocodone-Acetaminophen] Hives   Imitrex [Sumatriptan] Palpitations    Intolerance     I personally reviewed active problem list, medication list, allergies, family history, social history, health maintenance with the patient/caregiver today.   ROS  Constitutional: Negative for fever or weight change.  Respiratory: Negative for cough and shortness of breath.   Cardiovascular: Negative for chest pain or palpitations.  Gastrointestinal: positive  for abdominal pain - since pancreatitis but intermittent now , no bowel changes.  Musculoskeletal: Negative for gait problem or joint swelling.  Skin: Negative for rash.  Neurological: Negative for dizziness or headache.  No other specific complaints in a complete review of systems (except as listed in HPI above).   Objective  Vitals:   02/28/23 1522  BP: 138/84  Pulse: 80  Resp: 16  SpO2: 98%  Weight: 177 lb (80.3 kg)  Height: 5\' 2"  (1.575 m)    Body mass index is 32.37 kg/m.  Physical Exam  Constitutional: Patient appears well-developed and well-nourished. Obese  No distress.  HEENT: head atraumatic, normocephalic, pupils equal and reactive to light, neck suppleCardiovascular: Normal rate, regular rhythm and normal heart sounds.  No murmur heard. No BLE edema. Pulmonary/Chest: Effort normal and breath sounds normal. No respiratory distress. Abdominal: Soft.  There is tenderness on upper quadrants during palpation, large old scar - healed. Psychiatric: Patient has a normal mood and affect. behavior is normal. Judgment and thought content normal.   Recent Results (from the past 2160 hour(s))  CBC with Differential/Platelet     Status: None   Collection Time: 12/08/22  9:05 AM  Result Value Ref Range   WBC 7.6 3.4 - 10.8 x10E3/uL   RBC 4.76 3.77 - 5.28 x10E6/uL    Hemoglobin 12.9 11.1 - 15.9 g/dL   Hematocrit 38.9 34.0 - 46.6 %   MCV 82 79 - 97 fL   MCH 27.1 26.6 - 33.0 pg   MCHC 33.2 31.5 - 35.7 g/dL   RDW 12.8 11.7 - 15.4 %  Platelets 376 150 - 450 x10E3/uL   Neutrophils 64 Not Estab. %   Lymphs 25 Not Estab. %   Monocytes 7 Not Estab. %   Eos 3 Not Estab. %   Basos 1 Not Estab. %   Neutrophils Absolute 4.8 1.4 - 7.0 x10E3/uL   Lymphocytes Absolute 1.9 0.7 - 3.1 x10E3/uL   Monocytes Absolute 0.5 0.1 - 0.9 x10E3/uL   EOS (ABSOLUTE) 0.2 0.0 - 0.4 x10E3/uL   Basophils Absolute 0.1 0.0 - 0.2 x10E3/uL   Immature Granulocytes 0 Not Estab. %   Immature Grans (Abs) 0.0 0.0 - 0.1 x10E3/uL  Comp. Metabolic Panel (12)     Status: Abnormal   Collection Time: 12/08/22  9:05 AM  Result Value Ref Range   Glucose 94 70 - 99 mg/dL   BUN 11 6 - 24 mg/dL   Creatinine, Ser 0.88 0.57 - 1.00 mg/dL   eGFR 76 >59 mL/min/1.73   BUN/Creatinine Ratio 13 9 - 23   Sodium 141 134 - 144 mmol/L   Potassium 4.1 3.5 - 5.2 mmol/L   Chloride 105 96 - 106 mmol/L   Calcium 9.8 8.7 - 10.2 mg/dL   Total Protein 7.2 6.0 - 8.5 g/dL   Albumin 4.1 3.8 - 4.9 g/dL   Globulin, Total 3.1 1.5 - 4.5 g/dL   Albumin/Globulin Ratio 1.3 1.2 - 2.2   Bilirubin Total 0.3 0.0 - 1.2 mg/dL   Alkaline Phosphatase 125 (H) 44 - 121 IU/L   AST 44 (H) 0 - 40 IU/L  Urinalysis, Complete     Status: None   Collection Time: 01/30/23  9:46 AM  Result Value Ref Range   Specific Gravity, UA 1.025 1.005 - 1.030   pH, UA 5.0 5.0 - 7.5   Color, UA Yellow Yellow   Appearance Ur Clear Clear   Leukocytes,UA Negative Negative   Protein,UA Negative Negative/Trace   Glucose, UA Negative Negative   Ketones, UA Negative Negative   RBC, UA Negative Negative   Bilirubin, UA Negative Negative   Urobilinogen, Ur 0.2 0.2 - 1.0 mg/dL   Nitrite, UA Negative Negative   Microscopic Examination See below:   Microscopic Examination     Status: Abnormal   Collection Time: 01/30/23  9:46 AM   Urine  Result  Value Ref Range   WBC, UA 0-5 0 - 5 /hpf   RBC, Urine 0-2 0 - 2 /hpf   Epithelial Cells (non renal) >10 (A) 0 - 10 /hpf   Casts Present (A) None seen /lpf   Cast Type Granular casts (A) N/A    Comment: Hyaline casts   Mucus, UA Present (A) Not Estab.   Bacteria, UA Moderate (A) None seen/Few    PHQ2/9:    02/28/2023    3:20 PM 11/29/2022    1:20 PM 10/21/2022    8:51 AM 10/04/2022    3:38 PM 08/23/2022   10:11 AM  Depression screen PHQ 2/9  Decreased Interest 0 1 2 1 3   Down, Depressed, Hopeless 1 1 1 2 2   PHQ - 2 Score 1 2 3 3 5   Altered sleeping 0 1 3 0 2  Tired, decreased energy 0 1 3 0 0  Change in appetite 0 0 3 0 2  Feeling bad or failure about yourself  0 1 1 0 0  Trouble concentrating 1 2 3  0 2  Moving slowly or fidgety/restless 0 0 0 0 0  Suicidal thoughts 0 0 0 0 0  PHQ-9 Score 2 7  16 3 11   Difficult doing work/chores    Not difficult at all     phq 9 is positive   Fall Risk:    02/28/2023    3:20 PM 11/29/2022    1:09 PM 10/21/2022    8:40 AM 10/04/2022    3:38 PM 08/23/2022   10:10 AM  Fall Risk   Falls in the past year? 0 1 1 1 1   Number falls in past yr: 0 1 1 1 1   Injury with Fall? 0 0 0 0 0  Risk for fall due to : No Fall Risks No Fall Risks No Fall Risks  No Fall Risks  Follow up Falls prevention discussed Falls prevention discussed Falls prevention discussed  Falls prevention discussed      Functional Status Survey: Is the patient deaf or have difficulty hearing?: No Does the patient have difficulty seeing, even when wearing glasses/contacts?: No Does the patient have difficulty concentrating, remembering, or making decisions?: Yes Does the patient have difficulty walking or climbing stairs?: No Does the patient have difficulty dressing or bathing?: No Does the patient have difficulty doing errands alone such as visiting a doctor's office or shopping?: No    Assessment & Plan  1. Idiopathic chronic pancreatitis (HCC)  -  lipase/protease/amylase (CREON) 36000 UNITS CPEP capsule; Take 2 capsules (72,000 Units total) by mouth 3 (three) times daily with meals. May also take 1 capsule (36,000 Units total) as needed (with snacks).  Dispense: 720 capsule; Refill: 1  2. Moderate persistent asthma without complication  - Fluticasone-Umeclidin-Vilant (TRELEGY ELLIPTA) 100-62.5-25 MCG/ACT AEPB; Inhale 1 puff into the lungs daily.  Dispense: 3 each; Refill: 1   3. Mild episode of recurrent major depressive disorder ([place])  Improving  4. Abnormal CT scan, kidney  We will give her Dr. Dene Gentry number   5. Essential hypertension  At goal  6. Migraine with aura and without status migrainosus, not intractable  She needs to contact Dr. Melrose Nakayama for refills of her medications, needs to be 90 day supply   7. B12 deficiency   8. Prediabetes  Lost weight due to pancreatitis

## 2023-02-28 ENCOUNTER — Ambulatory Visit (INDEPENDENT_AMBULATORY_CARE_PROVIDER_SITE_OTHER): Payer: Managed Care, Other (non HMO) | Admitting: Family Medicine

## 2023-02-28 ENCOUNTER — Encounter: Payer: Self-pay | Admitting: Family Medicine

## 2023-02-28 VITALS — BP 138/84 | HR 80 | Resp 16 | Ht 62.0 in | Wt 177.0 lb

## 2023-02-28 DIAGNOSIS — E538 Deficiency of other specified B group vitamins: Secondary | ICD-10-CM

## 2023-02-28 DIAGNOSIS — J4540 Moderate persistent asthma, uncomplicated: Secondary | ICD-10-CM

## 2023-02-28 DIAGNOSIS — I10 Essential (primary) hypertension: Secondary | ICD-10-CM

## 2023-02-28 DIAGNOSIS — F330 Major depressive disorder, recurrent, mild: Secondary | ICD-10-CM

## 2023-02-28 DIAGNOSIS — R7303 Prediabetes: Secondary | ICD-10-CM

## 2023-02-28 DIAGNOSIS — K861 Other chronic pancreatitis: Secondary | ICD-10-CM | POA: Diagnosis not present

## 2023-02-28 DIAGNOSIS — R93429 Abnormal radiologic findings on diagnostic imaging of unspecified kidney: Secondary | ICD-10-CM | POA: Diagnosis not present

## 2023-02-28 DIAGNOSIS — G43109 Migraine with aura, not intractable, without status migrainosus: Secondary | ICD-10-CM

## 2023-02-28 MED ORDER — TRELEGY ELLIPTA 100-62.5-25 MCG/ACT IN AEPB: 1.0000 | INHALATION_SPRAY | Freq: Every day | RESPIRATORY_TRACT | 1 refills | Status: DC

## 2023-02-28 MED ORDER — PANCRELIPASE (LIP-PROT-AMYL) 36000-114000 UNITS PO CPEP: ORAL_CAPSULE | ORAL | 1 refills | Status: DC

## 2023-02-28 NOTE — Patient Instructions (Signed)
Please contact Dr. Bernardo Heater and have CT scan of kidney repeated

## 2023-04-10 ENCOUNTER — Encounter: Payer: Self-pay | Admitting: Physician Assistant

## 2023-04-10 ENCOUNTER — Ambulatory Visit: Payer: Self-pay

## 2023-04-10 ENCOUNTER — Ambulatory Visit: Payer: Managed Care, Other (non HMO) | Admitting: Physician Assistant

## 2023-04-10 VITALS — BP 130/80 | HR 92 | Temp 97.6°F | Resp 16 | Ht 62.0 in | Wt 175.6 lb

## 2023-04-10 DIAGNOSIS — Z8719 Personal history of other diseases of the digestive system: Secondary | ICD-10-CM

## 2023-04-10 DIAGNOSIS — R1011 Right upper quadrant pain: Secondary | ICD-10-CM | POA: Diagnosis not present

## 2023-04-10 LAB — POCT URINALYSIS DIPSTICK (MANUAL)
Leukocytes, UA: NEGATIVE
Nitrite, UA: NEGATIVE
Poct Blood: NEGATIVE
Poct Glucose: NORMAL mg/dL
Poct Ketones: NEGATIVE
Poct Protein: 30 mg/dL — AB
Poct Urobilinogen: NORMAL mg/dL — AB
Spec Grav, UA: >=1.03 — AB (ref 1.010–1.025)
pH, UA: 5 (ref 5.0–8.0)

## 2023-04-10 NOTE — Telephone Encounter (Signed)
    Chief Complaint: Abdominal pain, radiates to back.  Symptoms: Above Frequency: Friday Pertinent Negatives: Patient denies  Disposition: [] ED /[] Urgent Care (no appt availability in office) / [x] Appointment(In office/virtual)/ []  [facility]/ [] Home Care/ [] Refused Recommended Disposition /[] [facility]/ []  Follow-up with PCP Additional Notes:    Reason for Disposition  [1] MILD-MODERATE pain AND [2] constant AND [3] present > 2 hours  Answer Assessment - Initial Assessment Questions 1. LOCATION: "Where does it hurt?"      Upper and lower 2. RADIATION: "Does the pain shoot anywhere else?" (e.g., chest, back)     Back 3. ONSET: "When did the pain begin?" (e.g., minutes, hours or days ago)      Friday night 4. SUDDEN: "Gradual or sudden onset?"     Sudden 5. PATTERN "Does the pain come and go, or is it constant?"    - If it comes and goes: "How long does it last?" "Do you have pain now?"     (Note: Comes and goes means the pain is intermittent. It goes away completely between bouts.)    - If constant: "Is it getting better, staying the same, or getting worse?"      (Note: Constant means the pain never goes away completely; most serious pain is constant and gets worse.)      Gets worse as the day goes on 6. SEVERITY: "How bad is the pain?"  (e.g., Scale 1-10; mild, moderate, or severe)    - MILD (1-3): Doesn't interfere with normal activities, abdomen soft and not tender to touch.     - MODERATE (4-7): Interferes with normal activities or awakens from sleep, abdomen tender to touch.     - SEVERE (8-10): Excruciating pain, doubled over, unable to do any normal activities.       Now - mild 7. RECURRENT SYMPTOM: "Have you ever had this type of stomach pain before?" If Yes, ask: "When was the last time?" and "What happened that time?"      Yes 8. CAUSE: "What do you think is causing the stomach pain?"     Unsure 9. RELIEVING/AGGRAVATING FACTORS: "What makes it  better or worse?" (e.g., antacids, bending or twisting motion, bowel movement)     No 10. OTHER SYMPTOMS: "Do you have any other symptoms?" (e.g., back pain, diarrhea, fever, urination pain, vomiting)       No 11. PREGNANCY: "Is there any chance you are pregnant?" "When was your last menstrual period?"       No  Protocols used: Abdominal Pain - [facility]

## 2023-04-10 NOTE — Patient Instructions (Signed)
I recommend consuming a liquid diet for now until the pain starts to improve. Make sure you are staying well hydrated and you can use Tylenol as needed for  pain. Continue to take your Creon  We will keep you updated with the results of your testing as it becomes available. If you continue to have pain or the fevers return, I recommend calling your GI office to see if they recommend further measures.

## 2023-04-10 NOTE — Progress Notes (Signed)
Acute Office Visit   Patient: Tammy Swanson   DOB: March 05, 1966   57 y.o. Female  MRN: [id number] Visit Date: 04/10/2023  Today's healthcare provider: Oswaldo Conroy Ryosuke Ericksen, PA-C  Introduced myself to the patient as a Secondary school teacher and provided education on APPs in clinical practice.    Chief Complaint  Patient presents with   Abdominal Pain    Fever this weekend.    Subjective    HPI HPI     Abdominal Pain    Additional comments: Fever this weekend.       Last edited by Riesa Pope, RMA on 04/10/2023 10:21 AM.      She reports she had a fever Friday - sat with tmax of 103 She reports she was feeling nauseated and bloated over the weekend  She states last night she was not feeling well, felt full but was still hungry and tired  She is concerned due to previous hx of pancreatitis  She reports some back soreness and right side soreness. States it is tender to the touch She reports an her pain and soreness seem to build over the course of the day. Reports a nagging, constant pain She reports diarrhea last week, and the week before last she had oily appearing stools.  Interventions: has not been eating much and took Tylenol for fever  She takes Creon as directed  She reports pain is worse after eating  She has a GI provider in [place] that she sees regularly for her pancreatitis- she has not reached out to their office yet as her provider is on maternity leave  She reports an itchy rash on her arms and neck for the past 3 weeks  She cannot recall using anything new on her skin  or eating anything new in that time frame   Medications: Outpatient Medications Prior to Visit  Medication Sig   albuterol (VENTOLIN HFA) 108 (90 Base) MCG/ACT inhaler TAKE 2 PUFFS BY MOUTH EVERY 6 HOURS AS NEEDED FOR WHEEZE OR SHORTNESS OF BREATH   cholecalciferol (VITAMIN D) 1000 units tablet Take 1,000 Units by mouth daily.   Cyanocobalamin (B-12) 1000 MCG SUBL Place 1 tablet under the  tongue daily.   estradiol (VIVELLE-DOT) 0.05 MG/24HR patch APPLY 1 PATCH TWICE WEEKLY   Fluticasone-Umeclidin-Vilant (TRELEGY ELLIPTA) 100-62.5-25 MCG/ACT AEPB Inhale 1 puff into the lungs daily.   lipase/protease/amylase (CREON) 36000 UNITS CPEP capsule Take 2 capsules (72,000 Units total) by mouth 3 (three) times daily with meals. May also take 1 capsule (36,000 Units total) as needed (with snacks).   losartan-hydrochlorothiazide (HYZAAR) 100-12.5 MG tablet Take 1 tablet by mouth daily.   montelukast (SINGULAIR) 10 MG tablet TAKE 1 TABLET BY MOUTH EVERYDAY AT BEDTIME   nadolol (CORGARD) 20 MG tablet Take 1 tablet (20 mg total) by mouth daily. In place of Atenolol   nortriptyline (PAMELOR) 50 MG capsule Take 50 mg by mouth at bedtime.   omeprazole (PRILOSEC) 20 MG capsule TAKE 1 CAPSULE BY MOUTH EVERY DAY BEFORE BREAKFAST   UBRELVY 100 MG TABS Take 100 mg by mouth.   venlafaxine (EFFEXOR) 75 MG tablet Take 225 mg by mouth daily at 12 noon.   No facility-administered medications prior to visit.    Review of Systems  Constitutional:  Positive for chills, fatigue and fever.  Gastrointestinal:  Positive for abdominal distention (feels bloated), abdominal pain, diarrhea and nausea. Negative for blood in stool, constipation and vomiting.  Musculoskeletal:  Positive for  back pain.        Objective    BP 130/80   Pulse 92   Temp 97.6 F (36.4 C)   Resp 16   Ht 5\' 2"  (1.575 m)   Wt 175 lb 9.6 oz (79.7 kg)   SpO2 98%   BMI 32.12 kg/m     Physical Exam Vitals reviewed.  Constitutional:      General: She is awake.     Appearance: Normal appearance. She is well-developed and well-groomed.  HENT:     Head: Normocephalic and atraumatic.  Pulmonary:     Effort: Pulmonary effort is normal.  Abdominal:     General: Abdomen is flat. A surgical scar is present. Bowel sounds are normal.     Palpations: Abdomen is soft.     Tenderness: There is abdominal tenderness in the right upper  quadrant and left upper quadrant. There is no guarding or rebound. Positive signs include Murphy's sign.  Skin:    General: Skin is warm and dry.  Neurological:     General: No focal deficit present.     Mental Status: She is alert and oriented to person, place, and time.  Psychiatric:        Mood and Affect: Mood normal.        Behavior: Behavior normal. Behavior is cooperative.        Thought Content: Thought content normal.        Judgment: Judgment normal.       Results for orders placed or performed in visit on 04/10/23  POCT Urinalysis Dip Manual  Result Value Ref Range   Spec Grav, UA >=1.030 (A) 1.010 - 1.025   pH, UA 5.0 5.0 - 8.0   Leukocytes, UA Negative Negative   Nitrite, UA Negative Negative   Poct Protein +30 (A) Negative, trace mg/dL   Poct Glucose Normal Normal mg/dL   Poct Ketones Negative Negative   Poct Urobilinogen Normal (A) Normal mg/dL   Poct Bilirubin +++ (A) Negative   Poct Blood Negative Negative, trace    Assessment & Plan      No follow-ups on file.      Problem List Items Addressed This Visit       Other   History of pancreatitis   Relevant Orders   POCT Urinalysis Dip Manual (Completed)   Amylase   CBC w/Diff/Platelet   COMPLETE METABOLIC PANEL WITH GFR   Lipase   Other Visit Diagnoses     Right upper quadrant abdominal pain    -  Primary Acute new concern  She reports abdominal pain and fever over the weekend  PE revealed abdominal tenderness along right upper quadrant with some radiation to left upper quadrant  She has a previous hx of pancreatitis and has been taking Creon as directed Will check, CBC, CMP, amylase and lipase along with urine dip to assist with diagnosis For now recommend she consume liquid diet and focus on staying hydrated, continue Creon If pain persists or symptoms worsen, recommend she reaches out to GI provider for further assistance Results of labs to further guide management once available Reviewed  OTC pain management- recommend Tylenol over NSAIDs given GI hx  Follow up as needed for persistent or progressing symptoms     Relevant Orders   Urinalysis, dipstick only   Urine Culture   POCT Urinalysis Dip Manual (Completed)   Amylase   CBC w/Diff/Platelet   COMPLETE METABOLIC PANEL WITH GFR   Lipase  No follow-ups on file.   I, Kaylynne Andres E Willet Schleifer, PA-C, have reviewed all documentation for this visit. The documentation on 04/10/23 for the exam, diagnosis, procedures, and orders are all accurate and complete.   Jacquelin Hawking, MHS, PA-C [facility]

## 2023-04-11 ENCOUNTER — Telehealth: Payer: Self-pay

## 2023-04-11 LAB — CBC WITH DIFFERENTIAL/PLATELET
Eosinophils Absolute: 178 cells/uL (ref 15–500)
HCT: 43.2 % (ref 35.0–45.0)
Hemoglobin: 14.4 g/dL (ref 11.7–15.5)
MCH: 27.3 pg (ref 27.0–33.0)
MCHC: 33.3 g/dL (ref 32.0–36.0)
Neutro Abs: 3663 cells/uL (ref 1500–7800)
Neutrophils Relative %: 55.5 %
Platelets: 326 10*3/uL (ref 140–400)
RBC: 5.27 10*6/uL — ABNORMAL HIGH (ref 3.80–5.10)

## 2023-04-11 LAB — URINE CULTURE
MICRO NUMBER:: 14887911
Result:: NO GROWTH
SPECIMEN QUALITY:: ADEQUATE

## 2023-04-11 NOTE — Telephone Encounter (Signed)
Copied from CRM [id number]. Topic: General - Other >> Apr 10, 2023  4:53 PM [place] B wrote: Reason for CRM: pt called in says let each person try to times and couldn't get blood drawn. She will go back tomorrow

## 2023-04-11 NOTE — Addendum Note (Signed)
Addended by: Benay Pike on: 04/11/2023 09:05 AM   Modules accepted: Orders

## 2023-04-12 LAB — COMPLETE METABOLIC PANEL WITH GFR
AG Ratio: 1.2 (calc) (ref 1.0–2.5)
ALT: 27 U/L (ref 6–29)
AST: 27 U/L (ref 10–35)
Albumin: 4.2 g/dL (ref 3.6–5.1)
Alkaline phosphatase (APISO): 112 U/L (ref 37–153)
BUN: 13 mg/dL (ref 7–25)
CO2: 27 mmol/L (ref 20–32)
Calcium: 10.1 mg/dL (ref 8.6–10.4)
Chloride: 101 mmol/L (ref 98–110)
Creat: 0.83 mg/dL (ref 0.50–1.03)
Globulin: 3.6 g/dL (calc) (ref 1.9–3.7)
Glucose, Bld: 104 mg/dL — ABNORMAL HIGH (ref 65–99)
Potassium: 4.7 mmol/L (ref 3.5–5.3)
Sodium: 140 mmol/L (ref 135–146)
Total Bilirubin: 0.5 mg/dL (ref 0.2–1.2)
Total Protein: 7.8 g/dL (ref 6.1–8.1)
eGFR: 82 mL/min/{1.73_m2} (ref >=60)

## 2023-04-12 LAB — CBC WITH DIFFERENTIAL/PLATELET
Absolute Monocytes: 568 cells/uL (ref 200–950)
Basophils Absolute: 59 cells/uL (ref 0–200)
Basophils Relative: 0.9 %
Eosinophils Relative: 2.7 %
Lymphs Abs: 2132 cells/uL (ref 850–3900)
MCV: 82 fL (ref 80.0–100.0)
MPV: 10.5 fL (ref 7.5–12.5)
Monocytes Relative: 8.6 %
RDW: 13 % (ref 11.0–15.0)
Total Lymphocyte: 32.3 %
WBC: 6.6 10*3/uL (ref 3.8–10.8)

## 2023-04-12 LAB — AMYLASE: Amylase: 41 U/L (ref 21–101)

## 2023-04-12 LAB — LIPASE: Lipase: 18 U/L (ref 7–60)

## 2023-04-12 NOTE — Progress Notes (Signed)
Your CBC did not show evidence of elevated white counts (an indicator of infection) or anemia Your electrolytes, liver and kidney function were in normal limits  The pancreas testing results were in normal ranges as well. Your urine culture did not show any bacterial growth so no UTI at this time.

## 2023-06-01 NOTE — Progress Notes (Signed)
Name: Tammy Swanson   MRN: [id number]    DOB: May 28, 1966   Date:06/05/2023       Progress Note  Subjective  Chief Complaint  Follow Up  HPI  Chronic idiopathic pancreatis: hospitalized October 27 th for 10 days, she is  under the care of [facility], had repeat CT done 12/02 ( results below ) new incidental findings of possible kidney infarct versus infiltrate, she had repeat CT and still has fluids surrounding pancreas and another lesion on kidney and will go back for repeat studies. She continues to have intermittent abdominal pain, feels like she is hit by a horse intermittently. She is avoiding heavy meals, afraid of eating since symptoms triggered by eating. She states Creon enzymes helped with symptoms but too expensive   IMPRESSION:11/12/2022  --Decreased size of low-attenuation peripancreatic collection along the uncinate process now measuring 4.0 cm. No new collections identified. --Nonspecific ill-defined area of hypoenhancement within the lower pole of the right kidney measuring up to 3.5 cm not present on prior. Findings could be secondary to a renal infarct, or infiltrative mass lesion, however given the short interval this is felt to be less likely. This could also be secondary to infection in the appropriate clinical setting. If there is clinical concern for infection, correlation with urinalysis is recommended. Recommend follow-up CT of the abdomen in 1 month.   Depression Mild Recurrent: she was   going to a [facility] for [facility] however lost to follow up due to change in insurance . Husband asked to separate from her in July 22- he told her he was cheating on her ( he lost his job this past Spring) daughter admitted in October 22  and diagnosed with Bipolar and is stable now. She is back to work now, still has brain fog We will try adding modafinil    Headaches: she has a long  history of migraines as an young adult, she was doing well, however last Fall 22 she noticed   symptoms returned. We tried Topamax and it was working but it caused brain fog and because of severe symptoms  she was referred to Dr. Malvin Johns, she is now off Topamax and taking Nortriptyline 50 mg every night , higher dose of Effexor, Nadolol , unable to afford Ubrelvy  Migraine is usually throbbing, severe, usually right side of head. She stopped Imitrex since it is not as efficacious and also does not like side effects.  She states not recent episodes   HTN: bp is at goal. She denies side effects of medication. She is taking Losartan hctz and Nadolol. She denies chest pain or  palpitation. BP has been controlled   Pre-diabetes: no polyphagia, polydipsia or polyuria.   Vitamin D and B12 deficiency: doing well on otc supplementation . We will recheck with next lab drawn   GERD/Chronic gastritis: she is feeling much better since taking Omeprazole - and symptoms are controlled .She is now skipping medication and doing well when she has a bland diet   Asthma moderate persistent  she states present in the Spring and Fall.  She states better controlled since she started to take singulair daily, now she is taking trelegy prn only. Currently no  wheezing, cough or SOB  Obesity: : BMI  is now below 35, currently weight is stable, she had pancreatitis and lost weight but it is stable now   Patient Active Problem List   Diagnosis Date Noted   Abnormal finding on diagnostic imaging of right kidney  11/29/2022   Moderate persistent asthma without complication 11/29/2022   Vitamin D deficiency 11/29/2022   B12 deficiency 11/29/2022   Mild protein-calorie malnutrition (HCC) 10/21/2022   Post-ERCP acute pancreatitis 10/21/2022   History of pancreatitis 08/23/2022   Chronic superficial gastritis without bleeding 08/23/2022   Migraine with aura and without status migrainosus, not intractable 08/23/2022   Moderate episode of recurrent major depressive disorder (HCC) 08/23/2022   Hypercalcemia 08/23/2022    Polyp of transverse colon    Transient confusion 05/25/2020   Headache disorder 10/22/2019   Sleep disorder 10/22/2019   History of carpal tunnel surgery of right wrist 12/06/2016   Prediabetes 10/15/2016   History of liver failure 08/24/2016   Migraine without aura and without status migrainosus, not intractable 09/10/2015   Eczema 09/10/2015   Bad memory 06/01/2015   Menopause 06/01/2015   Cough variant asthma 06/01/2015   Obesity (BMI 30-39.9) 06/01/2015   Nodule, subcutaneous 06/01/2015   Lesion of ulnar nerve 06/01/2015   Vitiligo 06/01/2015   Essential hypertension 09/03/2010    Past Surgical History:  Procedure Laterality Date   ABDOMINAL EXPLORATION SURGERY     x3   ABDOMINAL HYSTERECTOMY  2013   ABDOMINAL HYSTERECTOMY  2014   CARPAL TUNNEL RELEASE Right 12/06/2016   Procedure: CARPAL TUNNEL RELEASE;  Surgeon: Kennedy Bucker, MD;  Location: [facility];  Service: Orthopedics;  Laterality: Right;   CHOLECYSTECTOMY     COLONOSCOPY WITH PROPOFOL N/A 07/19/2021   Procedure: COLONOSCOPY WITH BIOPSY;  Surgeon: Midge Minium, MD;  Location: [facility];  Service: Endoscopy;  Laterality: N/A;   ESOPHAGOGASTRODUODENOSCOPY (EGD) WITH PROPOFOL N/A 07/19/2021   Procedure: ESOPHAGOGASTRODUODENOSCOPY (EGD) WITH BIOPSY;  Surgeon: Midge Minium, MD;  Location: [facility];  Service: Endoscopy;  Laterality: N/A;  Latex   FOOT SURGERY     FOOT SURGERY Left 2007/2009   Dr. Orland Jarred   KNEE ARTHROSCOPY Left 1985   KNEE SURGERY Left    MANDIBLE FRACTURE SURGERY     NASAL SINUS SURGERY     NASAL SINUS SURGERY  2012   POLYPECTOMY N/A 07/19/2021   Procedure: POLYPECTOMY;  Surgeon: Midge Minium, MD;  Location: [facility];  Service: Endoscopy;  Laterality: N/A;   RECONSTRUCTION MANDIBLE / MAXILLA  1992   TONSILLECTOMY     TONSILLECTOMY  1981   TRIGGER FINGER RELEASE      Family History  Problem Relation Age of Onset   Hypertension Mother    Cancer Mother        Breast,  colon   HIV/AIDS Mother    Breast cancer Mother    Cancer Father        lung cancer   Hypertension Sister    Hypertension Maternal Grandmother    Hypertension Maternal Grandfather    Bipolar disorder Daughter    Cancer Maternal Aunt        3 sisters with Breast Cancer   Breast cancer Maternal Aunt    Breast cancer Maternal Aunt    Cancer Paternal Aunt    Cancer Paternal Uncle     Social History   Tobacco Use   Smoking status: Never   Smokeless tobacco: Never  Substance Use Topics   Alcohol use: Yes    Comment: occasionally     Current Outpatient Medications:    albuterol (VENTOLIN HFA) 108 (90 Base) MCG/ACT inhaler, TAKE 2 PUFFS BY MOUTH EVERY 6 HOURS AS NEEDED FOR WHEEZE OR SHORTNESS OF BREATH, Disp: 18 each, Rfl: 0   cholecalciferol (VITAMIN D)  1000 units tablet, Take 1,000 Units by mouth daily., Disp: , Rfl:    Cyanocobalamin (B-12) 1000 MCG SUBL, Place 1 tablet under the tongue daily., Disp: , Rfl:    estradiol (VIVELLE-DOT) 0.05 MG/24HR patch, APPLY 1 PATCH TWICE WEEKLY, Disp: 24 patch, Rfl: 0   Fluticasone-Umeclidin-Vilant (TRELEGY ELLIPTA) 100-62.5-25 MCG/ACT AEPB, Inhale 1 puff into the lungs daily., Disp: 3 each, Rfl: 1   lipase/protease/amylase (CREON) 36000 UNITS CPEP capsule, Take 2 capsules (72,000 Units total) by mouth 3 (three) times daily with meals. May also take 1 capsule (36,000 Units total) as needed (with snacks)., Disp: 720 capsule, Rfl: 1   losartan-hydrochlorothiazide (HYZAAR) 100-12.5 MG tablet, Take 1 tablet by mouth daily., Disp: 90 tablet, Rfl: 1   montelukast (SINGULAIR) 10 MG tablet, TAKE 1 TABLET BY MOUTH EVERYDAY AT BEDTIME, Disp: 90 tablet, Rfl: 1   nadolol (CORGARD) 20 MG tablet, Take 1 tablet (20 mg total) by mouth daily. In place of Atenolol, Disp: 90 tablet, Rfl: 1   nortriptyline (PAMELOR) 50 MG capsule, Take 50 mg by mouth at bedtime., Disp: , Rfl:    omeprazole (PRILOSEC) 20 MG capsule, TAKE 1 CAPSULE BY MOUTH EVERY DAY BEFORE BREAKFAST,  Disp: 90 capsule, Rfl: 1   UBRELVY 100 MG TABS, Take 100 mg by mouth., Disp: , Rfl:    venlafaxine (EFFEXOR) 75 MG tablet, Take 225 mg by mouth daily at 12 noon., Disp: , Rfl:   Allergies  Allergen Reactions   Latex Anaphylaxis   Belviq [Lorcaserin Hcl]     Dry mouth, palpitation   Tomato Hives    "Cherry tomatoes"   Vicodin [Hydrocodone-Acetaminophen] Hives   Imitrex [Sumatriptan] Palpitations    Intolerance     I personally reviewed active problem list, medication list, allergies, family history, social history, health maintenance with the patient/caregiver today.   ROS  Ten systems reviewed and is negative except as mentioned in HPI   Objective  Vitals:   06/05/23 1348  BP: 126/82  Pulse: 88  Resp: 16  SpO2: 95%  Weight: 175 lb (79.4 kg)  Height: 5\' 2"  (1.575 m)    Body mass index is 32.01 kg/m.  Physical Exam  Constitutional: Patient appears well-developed and well-nourished. Obese  No distress.  HEENT: head atraumatic, normocephalic, pupils equal and reactive to light, neck supple Cardiovascular: Normal rate, regular rhythm and normal heart sounds.  No murmur heard. No BLE edema. Pulmonary/Chest: Effort normal and breath sounds normal. No respiratory distress. Abdominal: Soft.  There is no tenderness. Psychiatric: Patient has a normal mood and affect. behavior is normal. Judgment and thought content normal.   Recent Results (from the past 2160 hour(s))  POCT Urinalysis Dip Manual     Status: Abnormal   Collection Time: 04/10/23 10:56 AM  Result Value Ref Range   Spec Grav, UA >=1.030 (A) 1.010 - 1.025   pH, UA 5.0 5.0 - 8.0   Leukocytes, UA Negative Negative   Nitrite, UA Negative Negative   Poct Protein +30 (A) Negative, trace mg/dL   Poct Glucose Normal Normal mg/dL   Poct Ketones Negative Negative   Poct Urobilinogen Normal (A) Normal mg/dL   Poct Bilirubin +++ (A) Negative   Poct Blood Negative Negative, trace  Urine Culture     Status: None    Collection Time: 04/10/23 12:30 PM   Specimen: Urine  Result Value Ref Range   MICRO NUMBER: [id number]    SPECIMEN QUALITY: Adequate    Sample Source URINE    STATUS: FINAL  Result: No Growth   Amylase     Status: None   Collection Time: 04/11/23  9:12 AM  Result Value Ref Range   Amylase 41 21 - 101 U/L  CBC w/Diff/Platelet     Status: Abnormal   Collection Time: 04/11/23  9:12 AM  Result Value Ref Range   WBC 6.6 3.8 - 10.8 Thousand/uL   RBC 5.27 (H) 3.80 - 5.10 Million/uL   Hemoglobin 14.4 11.7 - 15.5 g/dL   HCT 40.9 81.1 - 91.4 %   MCV 82.0 80.0 - 100.0 fL   MCH 27.3 27.0 - 33.0 pg   MCHC 33.3 32.0 - 36.0 g/dL   RDW 78.2 95.6 - 21.3 %   Platelets 326 140 - 400 Thousand/uL   MPV 10.5 7.5 - 12.5 fL   Neutro Abs 3,663 1,500 - 7,800 cells/uL   Lymphs Abs 2,132 850 - 3,900 cells/uL   Absolute Monocytes 568 200 - 950 cells/uL   Eosinophils Absolute 178 15 - 500 cells/uL   Basophils Absolute 59 0 - 200 cells/uL   Neutrophils Relative % 55.5 %   Total Lymphocyte 32.3 %   Monocytes Relative 8.6 %   Eosinophils Relative 2.7 %   Basophils Relative 0.9 %  COMPLETE METABOLIC PANEL WITH GFR     Status: Abnormal   Collection Time: 04/11/23  9:12 AM  Result Value Ref Range   Glucose, Bld 104 (H) 65 - 99 mg/dL    Comment: .            Fasting reference interval . For someone without known diabetes, a glucose value between 100 and 125 mg/dL is consistent with prediabetes and should be confirmed with a follow-up test. .    BUN 13 7 - 25 mg/dL   Creat 0.86 5.78 - 4.69 mg/dL   eGFR 82 > OR = 60 GE/XBM/8.41L2   BUN/Creatinine Ratio SEE NOTE: 6 - 22 (calc)    Comment:    Not Reported: BUN and Creatinine are within    reference range. .    Sodium 140 135 - 146 mmol/L   Potassium 4.7 3.5 - 5.3 mmol/L   Chloride 101 98 - 110 mmol/L   CO2 27 20 - 32 mmol/L   Calcium 10.1 8.6 - 10.4 mg/dL   Total Protein 7.8 6.1 - 8.1 g/dL   Albumin 4.2 3.6 - 5.1 g/dL   Globulin 3.6 1.9 - 3.7  g/dL (calc)   AG Ratio 1.2 1.0 - 2.5 (calc)   Total Bilirubin 0.5 0.2 - 1.2 mg/dL   Alkaline phosphatase (APISO) 112 37 - 153 U/L   AST 27 10 - 35 U/L   ALT 27 6 - 29 U/L  Lipase     Status: None   Collection Time: 04/11/23  9:12 AM  Result Value Ref Range   Lipase 18 7 - 60 U/L    PHQ2/9:    06/05/2023    1:53 PM 04/10/2023   10:18 AM 02/28/2023    3:20 PM 11/29/2022    1:20 PM 10/21/2022    8:51 AM  Depression screen PHQ 2/9  Decreased Interest 0 0 0 1 2  Down, Depressed, Hopeless 2 0 1 1 1   PHQ - 2 Score 2 0 1 2 3   Altered sleeping 2 0 0 1 3  Tired, decreased energy 2 0 0 1 3  Change in appetite 1 0 0 0 3  Feeling bad or failure about yourself  1 0 0 1 1  Trouble concentrating  3 0 1 2 3   Moving slowly or fidgety/restless 0 0 0 0 0  Suicidal thoughts 0 0 0 0 0  PHQ-9 Score 11 0 2 7 16   Difficult doing work/chores  Not difficult at all       phq 9 is positive   Fall Risk:    06/05/2023    1:53 PM 04/10/2023   10:18 AM 02/28/2023    3:20 PM 11/29/2022    1:09 PM 10/21/2022    8:40 AM  Fall Risk   Falls in the past year? 0 0 0 1 1  Number falls in past yr: 0 0 0 1 1  Injury with Fall? 0 0 0 0 0  Risk for fall due to : No Fall Risks  No Fall Risks No Fall Risks No Fall Risks  Follow up Falls prevention discussed  Falls prevention discussed Falls prevention discussed Falls prevention discussed      Functional Status Survey: Is the patient deaf or have difficulty hearing?: No Does the patient have difficulty seeing, even when wearing glasses/contacts?: No Does the patient have difficulty concentrating, remembering, or making decisions?: Yes Does the patient have difficulty walking or climbing stairs?: No Does the patient have difficulty dressing or bathing?: No Does the patient have difficulty doing errands alone such as visiting a doctor's office or shopping?: No    Assessment & Plan  1. Mild episode of recurrent major depressive disorder (HCC)  - modafinil  (PROVIGIL) 100 MG tablet; Take 1 tablet (100 mg total) by mouth daily.  Dispense: 30 tablet; Refill: 0  2. Idiopathic chronic pancreatitis (HCC)  Stable, needs to take creon and make sure she is eating protein   3. Mild protein-calorie malnutrition (HCC)  Increase protein intake   4. Moderate persistent asthma without complication  Doing well   5. Essential hypertension  BP is at goal   6. Prediabetes   7. Migraine with aura and without status migrainosus, not intractable  Doing better

## 2023-06-05 ENCOUNTER — Ambulatory Visit: Payer: BC Managed Care – PPO | Admitting: Family Medicine

## 2023-06-05 ENCOUNTER — Telehealth: Payer: Self-pay

## 2023-06-05 ENCOUNTER — Encounter: Payer: Self-pay | Admitting: Family Medicine

## 2023-06-05 VITALS — BP 126/82 | HR 88 | Resp 16 | Ht 62.0 in | Wt 175.0 lb

## 2023-06-05 DIAGNOSIS — I10 Essential (primary) hypertension: Secondary | ICD-10-CM

## 2023-06-05 DIAGNOSIS — J4540 Moderate persistent asthma, uncomplicated: Secondary | ICD-10-CM | POA: Diagnosis not present

## 2023-06-05 DIAGNOSIS — E441 Mild protein-calorie malnutrition: Secondary | ICD-10-CM | POA: Diagnosis not present

## 2023-06-05 DIAGNOSIS — F330 Major depressive disorder, recurrent, mild: Secondary | ICD-10-CM

## 2023-06-05 DIAGNOSIS — Z1231 Encounter for screening mammogram for malignant neoplasm of breast: Secondary | ICD-10-CM

## 2023-06-05 DIAGNOSIS — R7303 Prediabetes: Secondary | ICD-10-CM

## 2023-06-05 DIAGNOSIS — K861 Other chronic pancreatitis: Secondary | ICD-10-CM

## 2023-06-05 DIAGNOSIS — G43109 Migraine with aura, not intractable, without status migrainosus: Secondary | ICD-10-CM

## 2023-06-05 MED ORDER — MODAFINIL 100 MG PO TABS
100.0000 mg | ORAL_TABLET | Freq: Every day | ORAL | 0 refills | Status: DC
Start: 2023-06-05 — End: 2023-07-18

## 2023-06-05 NOTE — Telephone Encounter (Signed)
PA initiated for Modafinil on CMM

## 2023-06-30 ENCOUNTER — Other Ambulatory Visit: Payer: Self-pay | Admitting: Family Medicine

## 2023-06-30 DIAGNOSIS — J4530 Mild persistent asthma, uncomplicated: Secondary | ICD-10-CM

## 2023-07-06 DIAGNOSIS — Z8719 Personal history of other diseases of the digestive system: Secondary | ICD-10-CM | POA: Diagnosis not present

## 2023-07-18 ENCOUNTER — Other Ambulatory Visit: Payer: Self-pay | Admitting: Family Medicine

## 2023-07-18 DIAGNOSIS — L810 Postinflammatory hyperpigmentation: Secondary | ICD-10-CM | POA: Diagnosis not present

## 2023-07-18 DIAGNOSIS — L708 Other acne: Secondary | ICD-10-CM | POA: Diagnosis not present

## 2023-07-18 DIAGNOSIS — F330 Major depressive disorder, recurrent, mild: Secondary | ICD-10-CM

## 2023-07-18 NOTE — Telephone Encounter (Signed)
Medication Refill - Medication: modafinil (PROVIGIL) 100 MG tablet [[id number]]   Pt reports that she is happy with the medication after 30 days  Has the patient contacted their pharmacy? Yes.   (Agent: If no, request that the patient contact the pharmacy for the refill. If patient does not wish to contact the pharmacy document the reason why and proceed with request.) (Agent: If yes, when and what did the pharmacy advise?)  Preferred Pharmacy (with phone number or street name): [id number] - [place], [place] - [address] AT [facility] [address] Phone: [phone]  Fax: [phone]   Has the patient been seen for an appointment in the last year OR does the patient have an upcoming appointment? Yes.    Agent: Please be advised that RX refills may take up to 3 business days. We ask that you follow-up with your pharmacy.

## 2023-07-19 ENCOUNTER — Other Ambulatory Visit: Payer: Self-pay

## 2023-07-19 DIAGNOSIS — F330 Major depressive disorder, recurrent, mild: Secondary | ICD-10-CM

## 2023-07-19 MED ORDER — MODAFINIL 100 MG PO TABS
100.0000 mg | ORAL_TABLET | Freq: Every day | ORAL | 0 refills | Status: DC
Start: 2023-07-19 — End: 2023-08-29

## 2023-07-19 NOTE — Telephone Encounter (Signed)
Requested medication (s) are due for refill today: yes  Requested medication (s) are on the active medication list: yes  Last refill:  06/05/23  Future visit scheduled: yes  Notes to clinic:  Medication not assigned to a protocol, review manually.      Requested Prescriptions  Pending Prescriptions Disp Refills   modafinil (PROVIGIL) 100 MG tablet 30 tablet 0    Sig: Take 1 tablet (100 mg total) by mouth daily.     Off-Protocol Failed - 07/18/2023  2:46 PM      Failed - Medication not assigned to a protocol, review manually.      Passed - Valid encounter within last 12 months    Recent Outpatient Visits           1 month ago Mild episode of recurrent major depressive disorder [facility])   [place] [facility] Alba Cory, MD   3 months ago Right upper quadrant abdominal pain   [place] [facility] Mecum, Erin E, PA-C   4 months ago Idiopathic chronic pancreatitis [facility])   [place] [facility] Alba Cory, MD   7 months ago Moderate episode of recurrent major depressive disorder [facility])   [facility] Alba Cory, MD   9 months ago Hospital discharge follow-up   [facility] Alba Cory, MD       Future Appointments             In 1 month Alba Cory, MD [facility]

## 2023-07-21 DIAGNOSIS — K859 Acute pancreatitis without necrosis or infection, unspecified: Secondary | ICD-10-CM | POA: Diagnosis not present

## 2023-07-21 DIAGNOSIS — Z8719 Personal history of other diseases of the digestive system: Secondary | ICD-10-CM | POA: Diagnosis not present

## 2023-07-21 DIAGNOSIS — Z780 Asymptomatic menopausal state: Secondary | ICD-10-CM | POA: Diagnosis not present

## 2023-08-02 NOTE — Telephone Encounter (Signed)
Copied from CRM [id number]. Topic: General - Other >> Aug 02, 2023  1:50 PM Dondra Prader E wrote: Reason for CRM: Pt called requesting follow up from GI, says you can message her via Mychart

## 2023-08-04 NOTE — Telephone Encounter (Signed)
Copied from CRM [id number]. Topic: General - Other >> Aug 04, 2023  3:23 PM Franchot Heidelberg wrote: Reason for CRM: Pt called requesting to speak to PCP regarding her Ultrasound >> Aug 04, 2023  3:25 PM Dondra Prader E wrote: *CT not Ultrasound!

## 2023-08-28 NOTE — Progress Notes (Unsigned)
Name: Tammy Swanson   MRN: [id number]    DOB: 1966-10-16   Date:08/29/2023       Progress Note  Subjective  Chief Complaint  Follow Up  HPI  Chronic idiopathic pancreatis: hospitalized October 27 th for 10 days, she is  under the care of [facility], had repeat CT done 12/02 ( results below ) new incidental findings of possible kidney infarct versus infiltrate, she had repeat CT and still has fluids surrounding pancreas and another lesion on kidney and will go back for repeat studies. She continues to have intermittent abdominal pain, feels like she is hit by a horse intermittently. She is avoiding heavy meals, afraid of eating since symptoms triggered by eating. She states Creon enzymes helped with symptoms but too expensive  No abdominal pain at this time and GI advised her to try to eat a variety of foods but she is apprehensive   IMPRESSION:11/12/2022  --Decreased size of low-attenuation peripancreatic collection along the uncinate process now measuring 4.0 cm. No new collections identified. --Nonspecific ill-defined area of hypoenhancement within the lower pole of the right kidney measuring up to 3.5 cm not present on prior. Findings could be secondary to a renal infarct, or infiltrative mass lesion, however given the short interval this is felt to be less likely. This could also be secondary to infection in the appropriate clinical setting. If there is clinical concern for infection, correlation with urinalysis is recommended. Recommend follow-up CT of the abdomen in 1 month.   Abnormal lesion left kidney:   07/21/2023 - CT was done for follow up pancreatic lesion and that has decreased to 2.9 cm in size but found on kidney below. She denies any dysuria, hematuria or urinary frequency. GI asked me to further evaluate it   KIDNEYS/URETERS: Symmetric renal enhancement.  No hydronephrosis. The ill-defined region of hypoenhancement along the left posterior interpolar kidney has resolved.  There is a new focal area of hypoenhancement along the left posterior interpolar kidney slightly more inferiorly (2:58, 5:40).    Depression Mild Recurrent: she was  going to a [facility] for [facility] however lost to follow up due to change in insurance . Husband asked to separate from her in July 22- he told her he was cheating on her ( he lost his job this past Spring) daughter admitted in October 22  and diagnosed with Bipolar and is stable now. She was diagnosed with pancreatitis in October 2023 and still does not feel great physically and that affects her emotionally. Her son is doing well, she also states her daughter is in the nursing program and doing well. . She states in general depression is better. Modafinil helped with mental fogginess, Dr. Malvin Johns recommended to go down to 100 mg and she has noticed that it does improve with fatigue    Headaches: she has a long  history of migraines as an young adult, she was doing well, however last Fall 22 she noticed  symptoms returned. We tried Topamax and it was working but it caused brain fog and because of severe symptoms  she was referred to Dr. Malvin Johns, she is now off Topamax and taking Nortriptyline 50 mg every night , higher dose of Effexor, Nadolol , unable to afford Ubrelvy  Migraine is usually throbbing, severe, usually right side of head. She was given Maxalt , but denies any recent episodes and has not tried it yet She also given seroquel but could not tolerate it   HTN: bp is at  goal.  She is taking Losartan hctz and Nadolol. She denies chest pain or  palpitation. BP has been controlled and denies side effects of medications   Pre-diabetes: no polyphagia, polydipsia or polyuria. Recheck A1C  Vitamin D and B12 deficiency: doing well on otc supplementation . We will recheck  today   GERD/Chronic gastritis: she is feeling much better since taking Omeprazole - and symptoms are controlled .She is now skipping medication and doing well when she  has a bland diet   Asthma mild  persistent  she states present in the Spring and Fall.  She states better controlled since she started to take singulair daily, now she is taking trelegy prn only. Currently no  wheezing, cough or SOB. Doing well   Obesity: : BMI  is now below 35, she lost a lot of weight but is now gradually gaining weight again. She was malnourished   Patient Active Problem List   Diagnosis Date Noted   Abnormal finding on diagnostic imaging of right kidney 11/29/2022   Moderate persistent asthma without complication 11/29/2022   Vitamin D deficiency 11/29/2022   B12 deficiency 11/29/2022   Mild protein-calorie malnutrition (HCC) 10/21/2022   Post-ERCP acute pancreatitis 10/21/2022   History of pancreatitis 08/23/2022   Chronic superficial gastritis without bleeding 08/23/2022   Migraine with aura and without status migrainosus, not intractable 08/23/2022   Moderate episode of recurrent major depressive disorder (HCC) 08/23/2022   Hypercalcemia 08/23/2022   Polyp of transverse colon    Transient confusion 05/25/2020   Headache disorder 10/22/2019   Sleep disorder 10/22/2019   History of carpal tunnel surgery of right wrist 12/06/2016   Prediabetes 10/15/2016   History of liver failure 08/24/2016   Migraine without aura and without status migrainosus, not intractable 09/10/2015   Eczema 09/10/2015   Bad memory 06/01/2015   Menopause 06/01/2015   Cough variant asthma 06/01/2015   Obesity (BMI 30-39.9) 06/01/2015   Nodule, subcutaneous 06/01/2015   Lesion of ulnar nerve 06/01/2015   Vitiligo 06/01/2015   Essential hypertension 09/03/2010    Past Surgical History:  Procedure Laterality Date   ABDOMINAL EXPLORATION SURGERY     x3   ABDOMINAL HYSTERECTOMY  2013   ABDOMINAL HYSTERECTOMY  2014   CARPAL TUNNEL RELEASE Right 12/06/2016   Procedure: CARPAL TUNNEL RELEASE;  Surgeon: Kennedy Bucker, MD;  Location: [facility];  Service: Orthopedics;  Laterality: Right;    CHOLECYSTECTOMY     COLONOSCOPY WITH PROPOFOL N/A 07/19/2021   Procedure: COLONOSCOPY WITH BIOPSY;  Surgeon: Midge Minium, MD;  Location: [facility];  Service: Endoscopy;  Laterality: N/A;   ESOPHAGOGASTRODUODENOSCOPY (EGD) WITH PROPOFOL N/A 07/19/2021   Procedure: ESOPHAGOGASTRODUODENOSCOPY (EGD) WITH BIOPSY;  Surgeon: Midge Minium, MD;  Location: [facility];  Service: Endoscopy;  Laterality: N/A;  Latex   FOOT SURGERY     FOOT SURGERY Left 2007/2009   Dr. Orland Jarred   KNEE ARTHROSCOPY Left 1985   KNEE SURGERY Left    MANDIBLE FRACTURE SURGERY     NASAL SINUS SURGERY     NASAL SINUS SURGERY  2012   POLYPECTOMY N/A 07/19/2021   Procedure: POLYPECTOMY;  Surgeon: Midge Minium, MD;  Location: [facility];  Service: Endoscopy;  Laterality: N/A;   RECONSTRUCTION MANDIBLE / MAXILLA  1992   TONSILLECTOMY     TONSILLECTOMY  1981   TRIGGER FINGER RELEASE      Family History  Problem Relation Age of Onset   Hypertension Mother    Cancer Mother  Breast, colon   HIV/AIDS Mother    Breast cancer Mother    Cancer Father        lung cancer   Hypertension Sister    Hypertension Maternal Grandmother    Hypertension Maternal Grandfather    Bipolar disorder Daughter    Cancer Maternal Aunt        3 sisters with Breast Cancer   Breast cancer Maternal Aunt    Breast cancer Maternal Aunt    Cancer Paternal Aunt    Cancer Paternal Uncle     Social History   Tobacco Use   Smoking status: Never   Smokeless tobacco: Never  Substance Use Topics   Alcohol use: Yes    Comment: occasionally     Current Outpatient Medications:    albuterol (VENTOLIN HFA) 108 (90 Base) MCG/ACT inhaler, TAKE 2 PUFFS BY MOUTH EVERY 6 HOURS AS NEEDED FOR WHEEZE OR SHORTNESS OF BREATH, Disp: 18 each, Rfl: 0   cholecalciferol (VITAMIN D) 1000 units tablet, Take 1,000 Units by mouth daily., Disp: , Rfl:    Cyanocobalamin (B-12) 1000 MCG SUBL, Place 1 tablet under the tongue daily., Disp: ,  Rfl:    estradiol (VIVELLE-DOT) 0.05 MG/24HR patch, APPLY 1 PATCH TWICE WEEKLY, Disp: 24 patch, Rfl: 0   Fluticasone-Umeclidin-Vilant (TRELEGY ELLIPTA) 100-62.5-25 MCG/ACT AEPB, Inhale 1 puff into the lungs daily., Disp: 3 each, Rfl: 1   lipase/protease/amylase (CREON) 36000 UNITS CPEP capsule, Take 2 capsules (72,000 Units total) by mouth 3 (three) times daily with meals. May also take 1 capsule (36,000 Units total) as needed (with snacks)., Disp: 720 capsule, Rfl: 1   losartan-hydrochlorothiazide (HYZAAR) 100-12.5 MG tablet, Take 1 tablet by mouth daily., Disp: 90 tablet, Rfl: 1   modafinil (PROVIGIL) 100 MG tablet, Take 1 tablet (100 mg total) by mouth daily., Disp: 30 tablet, Rfl: 0   montelukast (SINGULAIR) 10 MG tablet, TAKE 1 TABLET BY MOUTH EVERY DAY AT BEDTIME, Disp: 90 tablet, Rfl: 0   nadolol (CORGARD) 20 MG tablet, Take 1 tablet (20 mg total) by mouth daily. In place of Atenolol, Disp: 90 tablet, Rfl: 1   nortriptyline (PAMELOR) 50 MG capsule, Take 50 mg by mouth at bedtime., Disp: , Rfl:    omeprazole (PRILOSEC) 20 MG capsule, TAKE 1 CAPSULE BY MOUTH EVERY DAY BEFORE BREAKFAST, Disp: 90 capsule, Rfl: 1   tretinoin (RETIN-A) 0.025 % cream, SMARTSIG:Sparingly Topical Every Other Day, Disp: , Rfl:    UBRELVY 100 MG TABS, Take 100 mg by mouth., Disp: , Rfl:    venlafaxine (EFFEXOR) 75 MG tablet, Take 225 mg by mouth daily at 12 noon., Disp: , Rfl:   Allergies  Allergen Reactions   Latex Anaphylaxis   Belviq [Lorcaserin Hcl]     Dry mouth, palpitation   Tomato Hives    "Cherry tomatoes"   Vicodin [Hydrocodone-Acetaminophen] Hives   Imitrex [Sumatriptan] Palpitations    Intolerance     I personally reviewed active problem list, medication list, allergies, family history, social history, health maintenance with the patient/caregiver today.   ROS  Constitutional: Negative for fever or weight change.  Respiratory: Negative for cough and shortness of breath.   Cardiovascular:  Negative for chest pain or palpitations.  Gastrointestinal: Negative for abdominal pain, no bowel changes.  Musculoskeletal: Negative for gait problem or joint swelling.  Skin: Negative for rash.  Neurological: Negative for dizziness or headache.  No other specific complaints in a complete review of systems (except as listed in HPI above).   Objective  Vitals:   08/29/23 1416  BP: 124/80  Pulse: 80  Resp: 16  Temp: 98.1 F (36.7 C)  TempSrc: Oral  SpO2: 98%  Weight: 180 lb 4.8 oz (81.8 kg)  Height: 5\' 2"  (1.575 m)    Body mass index is 32.98 kg/m.  Physical Exam  Constitutional: Patient appears well-developed and well-nourished. Obese  No distress.  HEENT: head atraumatic, normocephalic, pupils equal and reactive to light, neck supple Cardiovascular: Normal rate, regular rhythm and normal heart sounds.  No murmur heard. No BLE edema. Pulmonary/Chest: Effort normal and breath sounds normal. No respiratory distress. Abdominal: Soft.  There is no tenderness. Psychiatric: Patient has a normal mood and affect. behavior is normal. Judgment and thought content normal.   PHQ2/9:    08/29/2023    2:19 PM 06/05/2023    1:53 PM 04/10/2023   10:18 AM 02/28/2023    3:20 PM 11/29/2022    1:20 PM  Depression screen PHQ 2/9  Decreased Interest 1 0 0 0 1  Down, Depressed, Hopeless 1 2 0 1 1  PHQ - 2 Score 2 2 0 1 2  Altered sleeping 2 2 0 0 1  Tired, decreased energy 1 2 0 0 1  Change in appetite 1 1 0 0 0  Feeling bad or failure about yourself  0 1 0 0 1  Trouble concentrating 1 3 0 1 2  Moving slowly or fidgety/restless 0 0 0 0 0  Suicidal thoughts 0 0 0 0 0  PHQ-9 Score 7 11 0 2 7  Difficult doing work/chores Not difficult at all  Not difficult at all      phq 9 is positive   Fall Risk:    08/29/2023    2:19 PM 06/05/2023    1:53 PM 04/10/2023   10:18 AM 02/28/2023    3:20 PM 11/29/2022    1:09 PM  Fall Risk   Falls in the past year? 0 0 0 0 1  Number falls in past yr:   0 0 0 1  Injury with Fall?  0 0 0 0  Risk for fall due to : No Fall Risks No Fall Risks  No Fall Risks No Fall Risks  Follow up Falls prevention discussed Falls prevention discussed  Falls prevention discussed Falls prevention discussed     Assessment & Plan  1. Moderate episode of recurrent major depressive disorder (HCC)  - modafinil (PROVIGIL) 100 MG tablet; Take 1 tablet (100 mg total) by mouth daily.  Dispense: 90 tablet; Refill: 0  2. Need for immunization against influenza  - Flu vaccine trivalent PF, 6mos and older(Flulaval,Afluria,Fluarix,Fluzone)  3. Idiopathic chronic pancreatitis (HCC)  Pancreatic lesion is decreasing in size   4. Migraine with aura and without status migrainosus, not intractable  Keep follow up with neurologist   5. B12 deficiency  - B12 and Folate Panel - CBC with Differential/Platelet  6. Prediabetes  - Hemoglobin A1c  7. Abnormal CT scan, kidney  - Urinalysis, Complete - CULTURE, URINE COMPREHENSIVE  8. Essential hypertension  - COMPLETE METABOLIC PANEL WITH GFR - losartan-hydrochlorothiazide (HYZAAR) 100-12.5 MG tablet; Take 1 tablet by mouth daily.  Dispense: 90 tablet; Refill: 1 - nadolol (CORGARD) 20 MG tablet; Take 1 tablet (20 mg total) by mouth daily. In place of Atenolol  Dispense: 90 tablet; Refill: 1  9. Mild persistent asthma without complication  - montelukast (SINGULAIR) 10 MG tablet; TAKE 1 TABLET BY MOUTH EVERY DAY AT BEDTIME  Dispense: 90 tablet; Refill: 1  10. Chronic superficial gastritis without bleeding  - omeprazole (PRILOSEC) 20 MG capsule; TAKE 1 CAPSULE BY MOUTH EVERY DAY BEFORE BREAKFAST  Dispense: 90 capsule; Refill: 1  11. Lipid screening  - Lipid panel  12. Vitamin D deficiency  - VITAMIN D 25 Hydroxy (Vit-D Deficiency, Fractures)

## 2023-08-29 ENCOUNTER — Encounter: Payer: Self-pay | Admitting: Family Medicine

## 2023-08-29 ENCOUNTER — Ambulatory Visit: Payer: BC Managed Care – PPO | Admitting: Family Medicine

## 2023-08-29 VITALS — BP 124/80 | HR 80 | Temp 98.1°F | Resp 16 | Ht 62.0 in | Wt 180.3 lb

## 2023-08-29 DIAGNOSIS — E538 Deficiency of other specified B group vitamins: Secondary | ICD-10-CM

## 2023-08-29 DIAGNOSIS — K861 Other chronic pancreatitis: Secondary | ICD-10-CM | POA: Insufficient documentation

## 2023-08-29 DIAGNOSIS — F331 Major depressive disorder, recurrent, moderate: Secondary | ICD-10-CM | POA: Diagnosis not present

## 2023-08-29 DIAGNOSIS — R7303 Prediabetes: Secondary | ICD-10-CM | POA: Diagnosis not present

## 2023-08-29 DIAGNOSIS — R93429 Abnormal radiologic findings on diagnostic imaging of unspecified kidney: Secondary | ICD-10-CM

## 2023-08-29 DIAGNOSIS — G43109 Migraine with aura, not intractable, without status migrainosus: Secondary | ICD-10-CM | POA: Diagnosis not present

## 2023-08-29 DIAGNOSIS — J4530 Mild persistent asthma, uncomplicated: Secondary | ICD-10-CM

## 2023-08-29 DIAGNOSIS — E559 Vitamin D deficiency, unspecified: Secondary | ICD-10-CM

## 2023-08-29 DIAGNOSIS — K2930 Chronic superficial gastritis without bleeding: Secondary | ICD-10-CM

## 2023-08-29 DIAGNOSIS — Z13220 Encounter for screening for lipoid disorders: Secondary | ICD-10-CM

## 2023-08-29 DIAGNOSIS — I10 Essential (primary) hypertension: Secondary | ICD-10-CM

## 2023-08-29 DIAGNOSIS — Z23 Encounter for immunization: Secondary | ICD-10-CM

## 2023-08-29 MED ORDER — LOSARTAN POTASSIUM-HCTZ 100-12.5 MG PO TABS: 1.0000 | ORAL_TABLET | Freq: Every day | ORAL | 1 refills | Status: DC

## 2023-08-29 MED ORDER — MODAFINIL 100 MG PO TABS: 100.0000 mg | ORAL_TABLET | Freq: Every day | ORAL | 0 refills | Status: DC

## 2023-08-29 MED ORDER — MONTELUKAST SODIUM 10 MG PO TABS: ORAL_TABLET | ORAL | 1 refills | Status: DC

## 2023-08-29 MED ORDER — OMEPRAZOLE 20 MG PO CPDR
DELAYED_RELEASE_CAPSULE | ORAL | 1 refills | Status: DC
Start: 2023-08-29 — End: 2024-10-24

## 2023-08-29 MED ORDER — NADOLOL 20 MG PO TABS
20.0000 mg | ORAL_TABLET | Freq: Every day | ORAL | 1 refills | Status: DC
Start: 2023-08-29 — End: 2024-05-20

## 2023-08-31 LAB — URINALYSIS, COMPLETE
Bacteria, UA: NONE SEEN /HPF
Bilirubin Urine: NEGATIVE
Glucose, UA: NEGATIVE
Hgb urine dipstick: NEGATIVE
Hyaline Cast: NONE SEEN /LPF
Ketones, ur: NEGATIVE
Leukocytes,Ua: NEGATIVE
Nitrite: NEGATIVE
Protein, ur: NEGATIVE
RBC / HPF: NONE SEEN /HPF (ref 0–2)
Specific Gravity, Urine: 1.013 (ref 1.001–1.035)
WBC, UA: NONE SEEN /HPF (ref 0–5)
pH: 5.5 (ref 5.0–8.0)

## 2023-08-31 LAB — CULTURE, URINE COMPREHENSIVE
MICRO NUMBER:: 15482921
RESULT:: NO GROWTH
SPECIMEN QUALITY:: ADEQUATE

## 2023-09-05 ENCOUNTER — Telehealth: Payer: Self-pay | Admitting: Family Medicine

## 2023-09-05 NOTE — Telephone Encounter (Signed)
Referral Request - Has patient seen PCP for this complaint? Yes, says spoke with Dr Carlynn Purl about this on 09/17 appt  *If NO, is insurance requiring patient see PCP for this issue before PCP can refer them? Referral for which specialty: urology Preferred provider/office:  Reason for referral: ct scan didn't look good for her kidneys

## 2023-09-05 NOTE — Telephone Encounter (Signed)
Called w/ no answer

## 2023-09-29 ENCOUNTER — Encounter: Payer: Self-pay | Admitting: Physician Assistant

## 2023-09-29 ENCOUNTER — Ambulatory Visit: Payer: BC Managed Care – PPO | Admitting: Physician Assistant

## 2023-09-29 VITALS — BP 126/85 | HR 83 | Ht 62.0 in | Wt 184.0 lb

## 2023-09-29 DIAGNOSIS — R93429 Abnormal radiologic findings on diagnostic imaging of unspecified kidney: Secondary | ICD-10-CM

## 2023-09-29 LAB — URINALYSIS, COMPLETE
Bilirubin, UA: NEGATIVE
Glucose, UA: NEGATIVE
Ketones, UA: NEGATIVE
Leukocytes,UA: NEGATIVE
Nitrite, UA: NEGATIVE
RBC, UA: NEGATIVE
Specific Gravity, UA: >1.03 — ABNORMAL HIGH (ref 1.005–1.030)
Urobilinogen, Ur: 0.2 mg/dL (ref 0.2–1.0)
pH, UA: 5.5 (ref 5.0–7.5)

## 2023-09-29 LAB — MICROSCOPIC EXAMINATION: Epithelial Cells (non renal): >10 /[HPF] — AB (ref 0–10)

## 2023-09-29 MED ORDER — DIAZEPAM 2 MG PO TABS
ORAL_TABLET | ORAL | 0 refills | Status: DC
Start: 2023-09-29 — End: 2023-11-15

## 2023-09-29 NOTE — Progress Notes (Signed)
09/29/2023 9:25 AM   Tammy Swanson 16-Oct-1966 [id number]  CC: Chief Complaint  Patient presents with   Follow-up   HPI: Tammy Swanson is a 57 y.o. female with PMH pancreatitis quiring 2 hospitalizations this year who presents today for follow-up of a CT abnormality.   Saw Dr. Lonna Cobb in clinic on 01/30/2023 for the same.  At the time a CT scan at [facility] on 11/12/2022 showed hypoenhancement in the lower pole of the right kidney.  Follow-up CT at [facility] on 12/28/2022 showed no renal lesions.  This CT was over read by [facility] on 01/05/2023, and was interpreted as resolution of the right renal hypoenhancement with new hypoenhancement in the left posterior kidney.  Dr. Lonna Cobb ordered a CT abdomen with and without contrast for further evaluation, however this was never performed.  Repeat CTAP with contrast at [facility] on 07/21/2023 showed that the left posterior renal focus had resolved, but there was a new, more inferior hypoattenuating focus of the left kidney.  Per chart review, no positive urine cultures for at least the last year.  UAs have appeared bland or contaminated.  Creatinine has been stable around 0.85.  Today she reports she continues to struggle with pain from her pancreatitis that is limiting her ability to tolerate food.  The pain is primarily located in her abdomen, but it does radiate to the right flank.  She denies dysuria or gross hematuria.  In-office UA today positive for 1+ protein; urine microscopy with >10 epithelial cells/hpf, hyaline and granular casts, and many bacteria.  PMH: Past Medical History:  Diagnosis Date   Anxiety    Arthritis    KNEE-LEFT   Asthma    WELL-CONTROLLED   Complication of anesthesia    ASTHMA ATTACK DURING SURGERY X 3   Headache    H/O MIGRAINES   Hypertension    Liver failure (HCC) 1994   PT HAD GALLSTONES-PT ENDED UP WITH PANCREATITIS AND THEN ENDED UP IN LIVER FAILURE   Obesity    Pain in joint involving upper arm     Pancreatitis    Paresthesia of thumb of right hand    PONV (postoperative nausea and vomiting)    Vertigo    no episodes > 1 yr   Vitamin D deficiency    Vitiligo    Wears dentures    Partial lower    Surgical History: Past Surgical History:  Procedure Laterality Date   ABDOMINAL EXPLORATION SURGERY     x3   ABDOMINAL HYSTERECTOMY  2013   ABDOMINAL HYSTERECTOMY  2014   CARPAL TUNNEL RELEASE Right 12/06/2016   Procedure: CARPAL TUNNEL RELEASE;  Surgeon: Kennedy Bucker, MD;  Location: [facility];  Service: Orthopedics;  Laterality: Right;   CHOLECYSTECTOMY     COLONOSCOPY WITH PROPOFOL N/A 07/19/2021   Procedure: COLONOSCOPY WITH BIOPSY;  Surgeon: Midge Minium, MD;  Location: [facility];  Service: Endoscopy;  Laterality: N/A;   ESOPHAGOGASTRODUODENOSCOPY (EGD) WITH PROPOFOL N/A 07/19/2021   Procedure: ESOPHAGOGASTRODUODENOSCOPY (EGD) WITH BIOPSY;  Surgeon: Midge Minium, MD;  Location: [facility];  Service: Endoscopy;  Laterality: N/A;  Latex   FOOT SURGERY     FOOT SURGERY Left 2007/2009   Dr. Orland Jarred   KNEE ARTHROSCOPY Left 1985   KNEE SURGERY Left    MANDIBLE FRACTURE SURGERY     NASAL SINUS SURGERY     NASAL SINUS SURGERY  2012   POLYPECTOMY N/A 07/19/2021   Procedure: POLYPECTOMY;  Surgeon: Midge Minium, MD;  Location: [facility];  Service: Endoscopy;  Laterality: N/A;   RECONSTRUCTION MANDIBLE / MAXILLA  1992   TONSILLECTOMY     TONSILLECTOMY  1981   TRIGGER FINGER RELEASE      Home Medications:  Allergies as of 09/29/2023       Reactions   Latex Anaphylaxis   Belviq [lorcaserin Hcl]    Dry mouth, palpitation   Tomato Hives   "Cherry tomatoes"   Vicodin [hydrocodone-acetaminophen] Hives   Imitrex [sumatriptan] Palpitations   Intolerance         Medication List        Accurate as of September 29, 2023  9:25 AM. If you have any questions, ask your nurse or doctor.          albuterol 108 (90 Base) MCG/ACT inhaler Commonly known as:  VENTOLIN HFA TAKE 2 PUFFS BY MOUTH EVERY 6 HOURS AS NEEDED FOR WHEEZE OR SHORTNESS OF BREATH   B-12 1000 MCG Subl Place 1 tablet under the tongue daily.   cholecalciferol 1000 units tablet Commonly known as: VITAMIN D Take 1,000 Units by mouth daily.   estradiol 0.05 MG/24HR patch Commonly known as: VIVELLE-DOT APPLY 1 PATCH TWICE WEEKLY   lipase/protease/amylase 40981 UNITS Cpep capsule Commonly known as: Creon Take 2 capsules (72,000 Units total) by mouth 3 (three) times daily with meals. May also take 1 capsule (36,000 Units total) as needed (with snacks).   losartan-hydrochlorothiazide 100-12.5 MG tablet Commonly known as: HYZAAR Take 1 tablet by mouth daily.   modafinil 100 MG tablet Commonly known as: PROVIGIL Take 1 tablet (100 mg total) by mouth daily.   montelukast 10 MG tablet Commonly known as: SINGULAIR TAKE 1 TABLET BY MOUTH EVERY DAY AT BEDTIME   nadolol 20 MG tablet Commonly known as: CORGARD Take 1 tablet (20 mg total) by mouth daily. In place of Atenolol   nortriptyline 50 MG capsule Commonly known as: PAMELOR Take 50 mg by mouth at bedtime.   omeprazole 20 MG capsule Commonly known as: PRILOSEC TAKE 1 CAPSULE BY MOUTH EVERY DAY BEFORE BREAKFAST   Trelegy Ellipta 100-62.5-25 MCG/ACT Aepb Generic drug: Fluticasone-Umeclidin-Vilant Inhale 1 puff into the lungs daily.   tretinoin 0.025 % cream Commonly known as: RETIN-A SMARTSIG:Sparingly Topical Every Other Day   Ubrelvy 100 MG Tabs Generic drug: Ubrogepant Take 100 mg by mouth.   venlafaxine 75 MG tablet Commonly known as: EFFEXOR Take 225 mg by mouth daily at 12 noon.        Allergies:  Allergies  Allergen Reactions   Latex Anaphylaxis   Belviq [Lorcaserin Hcl]     Dry mouth, palpitation   Tomato Hives    "Cherry tomatoes"   Vicodin [Hydrocodone-Acetaminophen] Hives   Imitrex [Sumatriptan] Palpitations    Intolerance     Family History: Family History  Problem Relation Age  of Onset   Hypertension Mother    Cancer Mother        Breast, colon   HIV/AIDS Mother    Breast cancer Mother    Cancer Father        lung cancer   Hypertension Sister    Hypertension Maternal Grandmother    Hypertension Maternal Grandfather    Bipolar disorder Daughter    Cancer Maternal Aunt        3 sisters with Breast Cancer   Breast cancer Maternal Aunt    Breast cancer Maternal Aunt    Cancer Paternal Aunt    Cancer Paternal Uncle     Social History:  reports that she has never smoked. She has never used smokeless tobacco. She reports current alcohol use. She reports that she does not use drugs.  Physical Exam: There were no vitals taken for this visit.  Constitutional:  Alert and oriented, no acute distress, nontoxic appearing HEENT: [place], AT Cardiovascular: No clubbing, cyanosis, or edema Respiratory: Normal respiratory effort, no increased work of breathing Skin: No rashes, bruises or suspicious lesions Neurologic: Grossly intact, no focal deficits, moving all 4 extremities Psychiatric: Normal mood and affect  Laboratory Data: Results for orders placed or performed in visit on 09/29/23  Microscopic Examination   Urine  Result Value Ref Range   WBC, UA 0-5 0 - 5 /hpf   RBC, Urine 0-2 0 - 2 /hpf   Epithelial Cells (non renal) >10 (A) 0 - 10 /hpf   Casts Present (A) None seen /lpf   Cast Type Hyaline casts N/A   Bacteria, UA Many (A) None seen/Few  Urinalysis, Complete  Result Value Ref Range   Specific Gravity, UA >1.030 (H) 1.005 - 1.030   pH, UA 5.5 5.0 - 7.5   Color, UA Yellow Yellow   Appearance Ur Clear Clear   Leukocytes,UA Negative Negative   Protein,UA 1+ (A) Negative/Trace   Glucose, UA Negative Negative   Ketones, UA Negative Negative   RBC, UA Negative Negative   Bilirubin, UA Negative Negative   Urobilinogen, Ur 0.2 0.2 - 1.0 mg/dL   Nitrite, UA Negative Negative   Microscopic Examination See below:    Assessment & Plan:   1. Abnormal CT  scan, kidney UA today appears contaminated and she is not clinically infected.  Low suspicion for infectious process as the etiology for recent renal abnormalities in the absence of urinary symptoms or positive cultures.  I think infarction is unlikely given stable renal function and resolving hypoattenuating lesions that have been moving around.  Will plan to repeat a CT and have her follow-up with Dr. Lonna Cobb, however I suspect this may be inflammatory and related to her pancreatitis.  Prescribing 1 dose of Valium to take before her CT scan per patient request. - Urinalysis, Complete - CT ABDOMEN W WO CONTRAST; Future - diazepam (VALIUM) 2 MG tablet; Take one tablet by mouth 30-60 minutes prior to CT scan. Do not operate heavy machinery while on this medication.  Dispense: 1 tablet; Refill: 0   Return in about 4 weeks (around 10/27/2023) for Follow up with CT prior with Dr. Lonna Cobb.  Carman Ching, PA-C  [facility] [place] [address], [address] [place], [place] [address] [phone]

## 2023-10-09 NOTE — Addendum Note (Signed)
Addended byRanda Lynn on: 10/09/2023 03:46 PM   Modules accepted: Orders

## 2023-10-13 ENCOUNTER — Ambulatory Visit
Admission: RE | Admit: 2023-10-13 | Discharge: 2023-10-13 | Disposition: A | Discharge status: Home / Self Care | Payer: BC Managed Care – PPO | Source: Ambulatory Visit | Attending: Physician Assistant | Admitting: Physician Assistant

## 2023-10-13 DIAGNOSIS — R93429 Abnormal radiologic findings on diagnostic imaging of unspecified kidney: Secondary | ICD-10-CM

## 2023-10-13 DIAGNOSIS — N2889 Other specified disorders of kidney and ureter: Secondary | ICD-10-CM | POA: Diagnosis not present

## 2023-10-13 DIAGNOSIS — Z9049 Acquired absence of other specified parts of digestive tract: Secondary | ICD-10-CM | POA: Diagnosis not present

## 2023-10-13 MED ORDER — IOPAMIDOL (ISOVUE-370) INJECTION 76%
100.0000 mL | Freq: Once | INTRAVENOUS | Status: AC | PRN
  Administered 2023-10-13: 100 mL via INTRAVENOUS

## 2023-11-01 ENCOUNTER — Ambulatory Visit: Payer: BC Managed Care – PPO | Admitting: Urology

## 2023-11-01 ENCOUNTER — Encounter: Payer: Self-pay | Admitting: Urology

## 2023-11-01 VITALS — BP 151/93 | HR 79 | Ht 62.0 in | Wt 180.0 lb

## 2023-11-01 DIAGNOSIS — Z09 Encounter for follow-up examination after completed treatment for conditions other than malignant neoplasm: Secondary | ICD-10-CM | POA: Diagnosis not present

## 2023-11-01 DIAGNOSIS — Z87898 Personal history of other specified conditions: Secondary | ICD-10-CM

## 2023-11-01 DIAGNOSIS — R93422 Abnormal radiologic findings on diagnostic imaging of left kidney: Secondary | ICD-10-CM

## 2023-11-01 NOTE — Progress Notes (Signed)
I, Tammy Swanson, acting as a scribe for Tammy Altes, MD., have documented all relevant documentation on the behalf of Tammy Altes, MD, as directed by Tammy Altes, MD while in the presence of Tammy Altes, MD.  11/01/2023 12:40 PM   Tammy Swanson Aug 15, 1966 [id number]  Referring provider: Alba Cory, MD [address] [place],  [place] [address]  Chief Complaint  Patient presents with   Other    HPI: Tammy Swanson is a 57 y.o. female presents for follow-up.  Refer to Tammy Swanson's previous note 09/29/2023.  She has no complaints today.  CT abdomen pelvis without contrast performed 10/13/2023 showed no abnormalities, including any areas of hypoenhancement.   PMH: Past Medical History:  Diagnosis Date   Anxiety    Arthritis    KNEE-LEFT   Asthma    WELL-CONTROLLED   Complication of anesthesia    ASTHMA ATTACK DURING SURGERY X 3   Headache    H/O MIGRAINES   Hypertension    Liver failure (HCC) 1994   PT HAD GALLSTONES-PT ENDED UP WITH PANCREATITIS AND THEN ENDED UP IN LIVER FAILURE   Obesity    Pain in joint involving upper arm    Pancreatitis    Paresthesia of thumb of right hand    PONV (postoperative nausea and vomiting)    Vertigo    no episodes > 1 yr   Vitamin D deficiency    Vitiligo    Wears dentures    Partial lower    Surgical History: Past Surgical History:  Procedure Laterality Date   ABDOMINAL EXPLORATION SURGERY     x3   ABDOMINAL HYSTERECTOMY  2013   ABDOMINAL HYSTERECTOMY  2014   CARPAL TUNNEL RELEASE Right 12/06/2016   Procedure: CARPAL TUNNEL RELEASE;  Surgeon: Kennedy Bucker, MD;  Location: [facility];  Service: Orthopedics;  Laterality: Right;   CHOLECYSTECTOMY     COLONOSCOPY WITH PROPOFOL N/A 07/19/2021   Procedure: COLONOSCOPY WITH BIOPSY;  Surgeon: Midge Minium, MD;  Location: [facility];  Service: Endoscopy;  Laterality: N/A;   ESOPHAGOGASTRODUODENOSCOPY (EGD) WITH  PROPOFOL N/A 07/19/2021   Procedure: ESOPHAGOGASTRODUODENOSCOPY (EGD) WITH BIOPSY;  Surgeon: Midge Minium, MD;  Location: [facility];  Service: Endoscopy;  Laterality: N/A;  Latex   FOOT SURGERY     FOOT SURGERY Left 2007/2009   Dr. Orland Jarred   KNEE ARTHROSCOPY Left 1985   KNEE SURGERY Left    MANDIBLE FRACTURE SURGERY     NASAL SINUS SURGERY     NASAL SINUS SURGERY  2012   POLYPECTOMY N/A 07/19/2021   Procedure: POLYPECTOMY;  Surgeon: Midge Minium, MD;  Location: [facility];  Service: Endoscopy;  Laterality: N/A;   RECONSTRUCTION MANDIBLE / MAXILLA  1992   TONSILLECTOMY     TONSILLECTOMY  1981   TRIGGER FINGER RELEASE      Home Medications:  Allergies as of 11/01/2023       Reactions   Latex Anaphylaxis   Belviq [lorcaserin Hcl]    Dry mouth, palpitation   Tomato Hives   "Cherry tomatoes"   Vicodin [hydrocodone-acetaminophen] Hives   Imitrex [sumatriptan] Palpitations   Intolerance         Medication List        Accurate as of November 01, 2023 12:40 PM. If you have any questions, ask your nurse or doctor.          albuterol 108 (90 Base) MCG/ACT inhaler Commonly known as: VENTOLIN  HFA TAKE 2 PUFFS BY MOUTH EVERY 6 HOURS AS NEEDED FOR WHEEZE OR SHORTNESS OF BREATH   B-12 1000 MCG Subl Place 1 tablet under the tongue daily.   cholecalciferol 1000 units tablet Commonly known as: VITAMIN D Take 1,000 Units by mouth daily.   diazepam 2 MG tablet Commonly known as: Valium Take one tablet by mouth 30-60 minutes prior to CT scan. Do not operate heavy machinery while on this medication.   estradiol 0.05 MG/24HR patch Commonly known as: VIVELLE-DOT APPLY 1 PATCH TWICE WEEKLY   lipase/protease/amylase 30865 UNITS Cpep capsule Commonly known as: Creon Take 2 capsules (72,000 Units total) by mouth 3 (three) times daily with meals. May also take 1 capsule (36,000 Units total) as needed (with snacks).   losartan-hydrochlorothiazide 100-12.5 MG  tablet Commonly known as: HYZAAR Take 1 tablet by mouth daily.   modafinil 100 MG tablet Commonly known as: PROVIGIL Take 1 tablet (100 mg total) by mouth daily.   montelukast 10 MG tablet Commonly known as: SINGULAIR TAKE 1 TABLET BY MOUTH EVERY DAY AT BEDTIME   nadolol 20 MG tablet Commonly known as: CORGARD Take 1 tablet (20 mg total) by mouth daily. In place of Atenolol   nortriptyline 50 MG capsule Commonly known as: PAMELOR Take 50 mg by mouth at bedtime.   omeprazole 20 MG capsule Commonly known as: PRILOSEC TAKE 1 CAPSULE BY MOUTH EVERY DAY BEFORE BREAKFAST   Trelegy Ellipta 100-62.5-25 MCG/ACT Aepb Generic drug: Fluticasone-Umeclidin-Vilant Inhale 1 puff into the lungs daily.   tretinoin 0.025 % cream Commonly known as: RETIN-A SMARTSIG:Sparingly Topical Every Other Day   venlafaxine 75 MG tablet Commonly known as: EFFEXOR Take 225 mg by mouth daily at 12 noon.        Allergies:  Allergies  Allergen Reactions   Latex Anaphylaxis   Belviq [Lorcaserin Hcl]     Dry mouth, palpitation   Tomato Hives    "Cherry tomatoes"   Vicodin [Hydrocodone-Acetaminophen] Hives   Imitrex [Sumatriptan] Palpitations    Intolerance     Family History: Family History  Problem Relation Age of Onset   Hypertension Mother    Cancer Mother        Breast, colon   HIV/AIDS Mother    Breast cancer Mother    Cancer Father        lung cancer   Hypertension Sister    Hypertension Maternal Grandmother    Hypertension Maternal Grandfather    Bipolar disorder Daughter    Cancer Maternal Aunt        3 sisters with Breast Cancer   Breast cancer Maternal Aunt    Breast cancer Maternal Aunt    Cancer Paternal Aunt    Cancer Paternal Uncle     Social History:  reports that she has never smoked. She has never used smokeless tobacco. She reports current alcohol use. She reports that she does not use drugs.   Physical Exam: BP (!) 151/93   Pulse 79   Ht 5\' 2"  (1.575 m)    Wt 180 lb (81.6 kg)   BMI 32.92 kg/m   Constitutional:  Alert and oriented, No acute distress. HEENT: [place] AT Respiratory: Normal respiratory effort, no increased work of breathing. Psychiatric: Normal mood and affect.   Pertinent Imaging: CT was personally reviewed and interpreted.   CT  EXAM: CT ABDOMEN WITHOUT AND WITH CONTRAST   TECHNIQUE: Multidetector CT imaging of the abdomen was performed following the standard protocol before and following the bolus administration of  intravenous contrast.   RADIATION DOSE REDUCTION: This exam was performed according to the departmental dose-optimization program which includes automated exposure control, adjustment of the mA and/or kV according to patient size and/or use of iterative reconstruction technique.   CONTRAST:  ISOVUE-370 IOPAMIDOL (ISOVUE-370) INJECTION 76%   COMPARISON:  None Available.   FINDINGS: Lower chest: Lung bases are clear.   Hepatobiliary: Liver is within normal limits.   Status post cholecystectomy. Central pneumobilia. No intrahepatic or extrahepatic duct dilatation.   Pancreas: Within normal limits.   Spleen: Within normal limits.   Adrenals/Urinary Tract: Adrenal glands are within normal limits.   Kidneys are within normal limits.  No hydronephrosis.   Stomach/Bowel: Stomach is within normal limits.   Visualized bowel is grossly unremarkable.   Vascular/Lymphatic: No evidence of abdominal aortic aneurysm.   No suspicious abdominal lymphadenopathy.   Other: No abdominal ascites.   Musculoskeletal: Postsurgical changes along the midline anterior abdominal wall. No focal osseous lesions.   IMPRESSION: Status post cholecystectomy.   Otherwise negative CT abdomen.     Electronically Signed   By: Charline Bills M.D.   On: 10/31/2023 00:08   Assessment & Plan:    1. History of hypo-enhancement of the left kidney on prior CTs.  Recent follow-up CT showed no areas of  hypo-enhancement.  She will follow up as needed.   I have reviewed the above documentation for accuracy and completeness, and I agree with the above.   Tammy Altes, MD  [facility] [address] [place], [place] [address] [phone]

## 2023-11-08 NOTE — Progress Notes (Signed)
Name: Tammy Swanson   MRN: [id number]    DOB: Jul 11, 1966   Date:11/15/2023       Progress Note  Subjective  Chief Complaint  Chief Complaint  Patient presents with   Medical Management of Chronic Issues    HPI  Discussed the use of AI scribe software for clinical note transcription with the patient, who gave verbal consent to proceed.  History of Present Illness   The patient, with a history of hypertension, idiopathic chronic pancreatitis, and prediabetes, presents with concerns about elevated blood pressure readings during medical visits, despite reporting normal readings at home. She describes her home readings as typically around 117, suggesting possible white coat hypertension.  The patient also reports ongoing abdominal pain, which she describes as a "kick" on the right side. This pain is a residual symptom from a bout of pancreatitis that began in October 2023, following an ERCP procedure The pancreatitis was complicated, leading to a prolonged hospital stay and subsequent diagnosis of idiopathic chronic pancreatitis. The patient has been managing her condition with Creon, although she reports the medication is expensive and she tries to limit her intake to one or two capsules a day. She also notes that she has been careful with her diet to avoid exacerbating her condition.  In addition to her abdominal pain, the patient has been dealing with migraines, which she describes as severe and throbbing, typically located on the right side of her head. She has been managing her migraines with Maxalt as needed, after a trial of Topamax was discontinued due to mental fog.  The patient's other chronic conditions include mild asthma, which she reports is well-controlled with daily Singulair, and prediabetes, with a recent HbA1c of 6.2. She also has a history of depression, which she reports is improving, with her PHQ-9 score decreasing from 11 in June to 4 at the time of the conversation.  She is currently taking venlafaxine for her depression, which she reports is helpful.  The patient's medications for hypertension include losartan, HCTZ, and Nadolol . She also takes nortriptyline for her migraines, modafinil for mental fog, and vitamin B12 and D supplements. She was previously on omeprazole for gastritis and reflux, but discontinued it due to concerns about potential side effects. She reports a persistent dry mouth, which she attributes to the nortriptyline.  The patient's personal life has been stressful, with a recent separation from her spouse and a daughter struggling with bipolar disorder. She also reports that the holiday season is a difficult time for her due to the loss of her mother, whose birthday was on Christmas day. Despite these challenges, she reports that she is able to maintain her work and daily activities.         Patient Active Problem List   Diagnosis Date Noted   Mild persistent asthma without complication 08/29/2023   Idiopathic chronic pancreatitis (HCC) 08/29/2023   Abnormal finding on diagnostic imaging of right kidney 11/29/2022   Moderate persistent asthma without complication 11/29/2022   Vitamin D deficiency 11/29/2022   B12 deficiency 11/29/2022   Mild protein-calorie malnutrition (HCC) 10/21/2022   Post-ERCP acute pancreatitis 10/21/2022   History of pancreatitis 08/23/2022   Chronic superficial gastritis without bleeding 08/23/2022   Migraine with aura and without status migrainosus, not intractable 08/23/2022   Moderate episode of recurrent major depressive disorder (HCC) 08/23/2022   Hypercalcemia 08/23/2022   Polyp of transverse colon    Transient confusion 05/25/2020   Headache disorder 10/22/2019   Sleep disorder 10/22/2019  History of carpal tunnel surgery of right wrist 12/06/2016   Prediabetes 10/15/2016   History of liver failure 08/24/2016   Migraine without aura and without status migrainosus, not intractable 09/10/2015    Eczema 09/10/2015   Bad memory 06/01/2015   Menopause 06/01/2015   Cough variant asthma 06/01/2015   Obesity (BMI 30-39.9) 06/01/2015   Nodule, subcutaneous 06/01/2015   Lesion of ulnar nerve 06/01/2015   Vitiligo 06/01/2015   Essential hypertension 09/03/2010    Past Surgical History:  Procedure Laterality Date   ABDOMINAL EXPLORATION SURGERY     x3   ABDOMINAL HYSTERECTOMY  2013   ABDOMINAL HYSTERECTOMY  2014   CARPAL TUNNEL RELEASE Right 12/06/2016   Procedure: CARPAL TUNNEL RELEASE;  Surgeon: Kennedy Bucker, MD;  Location: [facility];  Service: Orthopedics;  Laterality: Right;   CHOLECYSTECTOMY     COLONOSCOPY WITH PROPOFOL N/A 07/19/2021   Procedure: COLONOSCOPY WITH BIOPSY;  Surgeon: Midge Minium, MD;  Location: [facility];  Service: Endoscopy;  Laterality: N/A;   ESOPHAGOGASTRODUODENOSCOPY (EGD) WITH PROPOFOL N/A 07/19/2021   Procedure: ESOPHAGOGASTRODUODENOSCOPY (EGD) WITH BIOPSY;  Surgeon: Midge Minium, MD;  Location: [facility];  Service: Endoscopy;  Laterality: N/A;  Latex   FOOT SURGERY     FOOT SURGERY Left 2007/2009   Dr. Orland Jarred   KNEE ARTHROSCOPY Left 1985   KNEE SURGERY Left    MANDIBLE FRACTURE SURGERY     NASAL SINUS SURGERY     NASAL SINUS SURGERY  2012   POLYPECTOMY N/A 07/19/2021   Procedure: POLYPECTOMY;  Surgeon: Midge Minium, MD;  Location: [facility];  Service: Endoscopy;  Laterality: N/A;   RECONSTRUCTION MANDIBLE / MAXILLA  1992   TONSILLECTOMY     TONSILLECTOMY  1981   TRIGGER FINGER RELEASE      Family History  Problem Relation Age of Onset   Hypertension Mother    Cancer Mother        Breast, colon   HIV/AIDS Mother    Breast cancer Mother    Cancer Father        lung cancer   Hypertension Sister    Hypertension Maternal Grandmother    Hypertension Maternal Grandfather    Bipolar disorder Daughter    Cancer Maternal Aunt        3 sisters with Breast Cancer   Breast cancer Maternal Aunt    Breast cancer Maternal  Aunt    Cancer Paternal Aunt    Cancer Paternal Uncle     Social History   Tobacco Use   Smoking status: Never   Smokeless tobacco: Never  Substance Use Topics   Alcohol use: Yes    Comment: occasionally     Current Outpatient Medications:    albuterol (VENTOLIN HFA) 108 (90 Base) MCG/ACT inhaler, TAKE 2 PUFFS BY MOUTH EVERY 6 HOURS AS NEEDED FOR WHEEZE OR SHORTNESS OF BREATH, Disp: 18 each, Rfl: 0   cholecalciferol (VITAMIN D) 1000 units tablet, Take 1,000 Units by mouth daily., Disp: , Rfl:    Cyanocobalamin (B-12) 1000 MCG SUBL, Place 1 tablet under the tongue daily., Disp: , Rfl:    diazepam (VALIUM) 2 MG tablet, Take one tablet by mouth 30-60 minutes prior to CT scan. Do not operate heavy machinery while on this medication., Disp: 1 tablet, Rfl: 0   estradiol (VIVELLE-DOT) 0.05 MG/24HR patch, APPLY 1 PATCH TWICE WEEKLY, Disp: 24 patch, Rfl: 0   Fluticasone-Umeclidin-Vilant (TRELEGY ELLIPTA) 100-62.5-25 MCG/ACT AEPB, Inhale 1 puff into the lungs daily., Disp: 3 each,  Rfl: 1   lipase/protease/amylase (CREON) 36000 UNITS CPEP capsule, Take 2 capsules (72,000 Units total) by mouth 3 (three) times daily with meals. May also take 1 capsule (36,000 Units total) as needed (with snacks)., Disp: 720 capsule, Rfl: 1   losartan-hydrochlorothiazide (HYZAAR) 100-12.5 MG tablet, Take 1 tablet by mouth daily., Disp: 90 tablet, Rfl: 1   modafinil (PROVIGIL) 100 MG tablet, Take 1 tablet (100 mg total) by mouth daily., Disp: 90 tablet, Rfl: 0   montelukast (SINGULAIR) 10 MG tablet, TAKE 1 TABLET BY MOUTH EVERY DAY AT BEDTIME, Disp: 90 tablet, Rfl: 1   nadolol (CORGARD) 20 MG tablet, Take 1 tablet (20 mg total) by mouth daily. In place of Atenolol, Disp: 90 tablet, Rfl: 1   nortriptyline (PAMELOR) 50 MG capsule, Take 50 mg by mouth at bedtime., Disp: , Rfl:    omeprazole (PRILOSEC) 20 MG capsule, TAKE 1 CAPSULE BY MOUTH EVERY DAY BEFORE BREAKFAST, Disp: 90 capsule, Rfl: 1   tretinoin (RETIN-A) 0.025 %  cream, SMARTSIG:Sparingly Topical Every Other Day, Disp: , Rfl:    venlafaxine (EFFEXOR) 75 MG tablet, Take 225 mg by mouth daily at 12 noon., Disp: , Rfl:   Allergies  Allergen Reactions   Latex Anaphylaxis   Belviq [Lorcaserin Hcl]     Dry mouth, palpitation   Tomato Hives    "Cherry tomatoes"   Vicodin [Hydrocodone-Acetaminophen] Hives   Imitrex [Sumatriptan] Palpitations    Intolerance     I personally reviewed active problem list, medication list, allergies, family history with the patient/caregiver today.   ROS  Ten systems reviewed and is negative except as mentioned in HPI    Objective  Vitals:   11/15/23 0741  BP: 136/88  Pulse: 81  Resp: 16  Temp: 97.9 F (36.6 C)  TempSrc: Oral  SpO2: 99%  Weight: 183 lb 11.2 oz (83.3 kg)  Height: 5\' 2"  (1.575 m)    Body mass index is 33.6 kg/m.  Physical Exam  Constitutional: Patient appears well-developed and well-nourished. Obese  No distress.  HEENT: head atraumatic, normocephalic, pupils equal and reactive to light, neck supple Cardiovascular: Normal rate, regular rhythm and normal heart sounds.  No murmur heard. No BLE edema. Pulmonary/Chest: Effort normal and breath sounds normal. No respiratory distress. Abdominal: Soft.  There is no tenderness. Psychiatric: Patient has a normal mood and affect. behavior is normal. Judgment and thought content normal.   Recent Results (from the past 2160 hour(s))  B12 and Folate Panel     Status: None   Collection Time: 08/29/23  3:17 PM  Result Value Ref Range   Vitamin B-12 860 200 - 1,100 pg/mL   Folate 12.9 ng/mL    Comment:                            Reference Range                            Low:           <3.4                            Borderline:    3.4-5.4                            Normal:        >5.4 .  COMPLETE METABOLIC PANEL WITH GFR     Status: None   Collection Time: 08/29/23  3:17 PM  Result Value Ref Range   Glucose, Bld 78 65 - 99 mg/dL     Comment: .            Fasting reference interval .    BUN 10 7 - 25 mg/dL   Creat 1.61 0.96 - 0.45 mg/dL   eGFR 79 > OR = 60 WU/JWJ/1.91Y7   BUN/Creatinine Ratio SEE NOTE: 6 - 22 (calc)    Comment:    Not Reported: BUN and Creatinine are within    reference range. .    Sodium 140 135 - 146 mmol/L   Potassium 4.4 3.5 - 5.3 mmol/L   Chloride 101 98 - 110 mmol/L   CO2 29 20 - 32 mmol/L   Calcium 10.0 8.6 - 10.4 mg/dL   Total Protein 7.8 6.1 - 8.1 g/dL   Albumin 4.2 3.6 - 5.1 g/dL   Globulin 3.6 1.9 - 3.7 g/dL (calc)   AG Ratio 1.2 1.0 - 2.5 (calc)   Total Bilirubin 0.3 0.2 - 1.2 mg/dL   Alkaline phosphatase (APISO) 115 37 - 153 U/L   AST 14 10 - 35 U/L   ALT 12 6 - 29 U/L  Hemoglobin A1c     Status: Abnormal   Collection Time: 08/29/23  3:17 PM  Result Value Ref Range   Hgb A1c MFr Bld 6.2 (H) <5.7 % of total Hgb    Comment: For someone without known diabetes, a hemoglobin  A1c value between 5.7% and 6.4% is consistent with prediabetes and should be confirmed with a  follow-up test. . For someone with known diabetes, a value <7% indicates that their diabetes is well controlled. A1c targets should be individualized based on duration of diabetes, age, comorbid conditions, and other considerations. . This assay result is consistent with an increased risk of diabetes. . Currently, no consensus exists regarding use of hemoglobin A1c for diagnosis of diabetes for children. .    Mean Plasma Glucose 131 mg/dL   eAG (mmol/L) 7.3 mmol/L    Comment: . This test was performed on the Roche cobas c503 platform. Effective 09/19/22, a change in test platforms from the Abbott Architect to the Roche cobas c503 may have shifted HbA1c results compared to historical results. Based on laboratory validation testing conducted at [facility], the Roche platform relative to the Abbott platform had an average increase in HbA1c value of < or = 0.3%. This difference is within accepted  variability  established by the [facility]. Note that not all individuals will have had a shift in their results and direct comparisons between historical and current results for testing conducted on different platforms is not recommended.   CBC with Differential/Platelet     Status: Abnormal   Collection Time: 08/29/23  3:17 PM  Result Value Ref Range   WBC 7.3 3.8 - 10.8 Thousand/uL   RBC 5.17 (H) 3.80 - 5.10 Million/uL   Hemoglobin 14.4 11.7 - 15.5 g/dL   HCT 82.9 56.2 - 13.0 %   MCV 83.9 80.0 - 100.0 fL   MCH 27.9 27.0 - 33.0 pg   MCHC 33.2 32.0 - 36.0 g/dL   RDW 86.5 78.4 - 69.6 %   Platelets 345 140 - 400 Thousand/uL   MPV 10.5 7.5 - 12.5 fL   Neutro Abs 4,431 1,500 - 7,800 cells/uL   Lymphs Abs 2,161 850 - 3,900 cells/uL  Absolute Monocytes 533 200 - 950 cells/uL   Eosinophils Absolute 117 15 - 500 cells/uL   Basophils Absolute 58 0 - 200 cells/uL   Neutrophils Relative % 60.7 %   Total Lymphocyte 29.6 %   Monocytes Relative 7.3 %   Eosinophils Relative 1.6 %   Basophils Relative 0.8 %  VITAMIN D 25 Hydroxy (Vit-D Deficiency, Fractures)     Status: None   Collection Time: 08/29/23  3:17 PM  Result Value Ref Range   Vit D, 25-Hydroxy 31 30 - 100 ng/mL    Comment: Vitamin D Status         25-OH Vitamin D: . Deficiency:                    <20 ng/mL Insufficiency:             20 - 29 ng/mL Optimal:                 > or = 30 ng/mL . For 25-OH Vitamin D testing on patients on  D2-supplementation and patients for whom quantitation  of D2 and D3 fractions is required, the [facility]TM) 25-OH VIT D, (D2,D3), LC/MS/MS is recommended: order  code 16109 (patients >29yrs). . See Note 1 . Note 1 . For additional information, please refer to  http://education.[facility].com/faq/FAQ199  (This link is being provided for informational/ educational purposes only.)   Urinalysis, Complete     Status: None   Collection Time: 08/29/23  3:58 PM   Result Value Ref Range   Color, Urine YELLOW YELLOW   APPearance CLEAR CLEAR   Specific Gravity, Urine 1.013 1.001 - 1.035   pH 5.5 5.0 - 8.0   Glucose, UA NEGATIVE NEGATIVE   Bilirubin Urine NEGATIVE NEGATIVE   Ketones, ur NEGATIVE NEGATIVE   Hgb urine dipstick NEGATIVE NEGATIVE   Protein, ur NEGATIVE NEGATIVE   Nitrite NEGATIVE NEGATIVE   Leukocytes,Ua NEGATIVE NEGATIVE   WBC, UA NONE SEEN 0 - 5 /HPF   RBC / HPF NONE SEEN 0 - 2 /HPF   Squamous Epithelial / HPF 0-5 < OR = 5 /HPF   Bacteria, UA NONE SEEN NONE SEEN /HPF   Hyaline Cast NONE SEEN NONE SEEN /LPF   Note      Comment: This urine was analyzed for the presence of WBC,  RBC, bacteria, casts, and other formed elements.  Only those elements seen were reported. . .   CULTURE, URINE COMPREHENSIVE     Status: None   Collection Time: 08/29/23  3:58 PM   Specimen: Urine  Result Value Ref Range   MICRO NUMBER: [id number]    SPECIMEN QUALITY: Adequate    Source OTHER (SPECIFY)    STATUS: FINAL    RESULT: No Growth   Urinalysis, Complete     Status: Abnormal   Collection Time: 09/29/23  9:28 AM  Result Value Ref Range   Specific Gravity, UA >1.030 (H) 1.005 - 1.030   pH, UA 5.5 5.0 - 7.5   Color, UA Yellow Yellow   Appearance Ur Clear Clear   Leukocytes,UA Negative Negative   Protein,UA 1+ (A) Negative/Trace   Glucose, UA Negative Negative   Ketones, UA Negative Negative   RBC, UA Negative Negative   Bilirubin, UA Negative Negative   Urobilinogen, Ur 0.2 0.2 - 1.0 mg/dL   Nitrite, UA Negative Negative   Microscopic Examination See below:   Microscopic Examination     Status: Abnormal   Collection Time: 09/29/23  9:28 AM  Urine  Result Value Ref Range   WBC, UA 0-5 0 - 5 /hpf   RBC, Urine 0-2 0 - 2 /hpf   Epithelial Cells (non renal) >10 (A) 0 - 10 /hpf   Casts Present (A) None seen /lpf   Cast Type Hyaline casts N/A    Comment: Granular casts   Bacteria, UA Many (A) None seen/Few     PHQ2/9:     11/15/2023    7:39 AM 08/29/2023    2:19 PM 06/05/2023    1:53 PM 04/10/2023   10:18 AM 02/28/2023    3:20 PM  Depression screen PHQ 2/9  Decreased Interest 1 1 0 0 0  Down, Depressed, Hopeless 1 1 2  0 1  PHQ - 2 Score 2 2 2  0 1  Altered sleeping 0 2 2 0 0  Tired, decreased energy 0 1 2 0 0  Change in appetite 0 1 1 0 0  Feeling bad or failure about yourself  1 0 1 0 0  Trouble concentrating 1 1 3  0 1  Moving slowly or fidgety/restless 0 0 0 0 0  Suicidal thoughts 0 0 0 0 0  PHQ-9 Score 4 7 11  0 2  Difficult doing work/chores Somewhat difficult Not difficult at all  Not difficult at all     phq 9 is positive   Fall Risk:    11/15/2023    7:34 AM 08/29/2023    2:19 PM 06/05/2023    1:53 PM 04/10/2023   10:18 AM 02/28/2023    3:20 PM  Fall Risk   Falls in the past year? 0 0 0 0 0  Number falls in past yr: 0  0 0 0  Injury with Fall? 0  0 0 0  Risk for fall due to : No Fall Risks No Fall Risks No Fall Risks  No Fall Risks  Follow up Falls prevention discussed;Education provided;Falls evaluation completed Falls prevention discussed Falls prevention discussed  Falls prevention discussed     Functional Status Survey: Is the patient deaf or have difficulty hearing?: No Does the patient have difficulty seeing, even when wearing glasses/contacts?: No Does the patient have difficulty concentrating, remembering, or making decisions?: Yes Does the patient have difficulty walking or climbing stairs?: No Does the patient have difficulty dressing or bathing?: No Does the patient have difficulty doing errands alone such as visiting a doctor's office or shopping?: No    Assessment & Plan  Assessment and Plan    Hypertension Blood pressure improved from last visit (151/93 to 136/88). Patient reports normal readings at home. -Continue current antihypertensive medications (Losartan- Hctz and Nadolol ).  Chronic Idiopathic  Pancreatitis Persistent right-sided abdominal pain. History of  ERCP and stent placement  Follow-up with gastroenterologist scheduled. -Continue Creon as tolerated. -Resume Omeprazole for gastritis until follow-up with gastroenterologist.  Depression Improvement noted in PHQ-9 score. Patient reports benefit from current medication regimen. -Continue Venlafaxine  and Nortriptyline 50mg  daily.  Migraines History of migraines, currently managed with Nortriptyline and Maxalt as needed. -Continue current migraine management regimen.  Asthma Well-controlled with Singulair and Trelegy. -Continue Singulair and Trelegy.  Prediabetes Last A1C 6.2 in September. -Continue lifestyle modifications and monitor symptoms.  General Health Maintenance -Continue Vitamin B12 and Vitamin D25 supplementation. -Continue Modafinil for mental fog. -Check blood pressure at home regularly. -Follow-up with gastroenterologist in January.

## 2023-11-15 ENCOUNTER — Ambulatory Visit: Payer: BC Managed Care – PPO | Admitting: Family Medicine

## 2023-11-15 ENCOUNTER — Encounter: Payer: Self-pay | Admitting: Family Medicine

## 2023-11-15 VITALS — BP 136/88 | HR 81 | Temp 97.9°F | Resp 16 | Ht 62.0 in | Wt 183.7 lb

## 2023-11-15 DIAGNOSIS — G43109 Migraine with aura, not intractable, without status migrainosus: Secondary | ICD-10-CM | POA: Diagnosis not present

## 2023-11-15 DIAGNOSIS — K861 Other chronic pancreatitis: Secondary | ICD-10-CM | POA: Diagnosis not present

## 2023-11-15 DIAGNOSIS — F331 Major depressive disorder, recurrent, moderate: Secondary | ICD-10-CM

## 2023-11-15 DIAGNOSIS — J4540 Moderate persistent asthma, uncomplicated: Secondary | ICD-10-CM

## 2023-11-15 DIAGNOSIS — F330 Major depressive disorder, recurrent, mild: Secondary | ICD-10-CM | POA: Diagnosis not present

## 2023-11-15 DIAGNOSIS — I10 Essential (primary) hypertension: Secondary | ICD-10-CM | POA: Diagnosis not present

## 2023-11-15 DIAGNOSIS — R7303 Prediabetes: Secondary | ICD-10-CM

## 2023-11-15 DIAGNOSIS — J4530 Mild persistent asthma, uncomplicated: Secondary | ICD-10-CM

## 2023-11-15 MED ORDER — MODAFINIL 100 MG PO TABS: 100.0000 mg | ORAL_TABLET | Freq: Every day | ORAL | 0 refills | Status: DC

## 2023-11-15 MED ORDER — TRELEGY ELLIPTA 100-62.5-25 MCG/ACT IN AEPB: 1.0000 | INHALATION_SPRAY | Freq: Every day | RESPIRATORY_TRACT | 1 refills | Status: DC

## 2023-11-21 DIAGNOSIS — L810 Postinflammatory hyperpigmentation: Secondary | ICD-10-CM | POA: Diagnosis not present

## 2023-11-21 DIAGNOSIS — L728 Other follicular cysts of the skin and subcutaneous tissue: Secondary | ICD-10-CM | POA: Diagnosis not present

## 2023-11-21 DIAGNOSIS — L708 Other acne: Secondary | ICD-10-CM | POA: Diagnosis not present

## 2023-12-07 DIAGNOSIS — Z8719 Personal history of other diseases of the digestive system: Secondary | ICD-10-CM | POA: Diagnosis not present

## 2023-12-07 DIAGNOSIS — K219 Gastro-esophageal reflux disease without esophagitis: Secondary | ICD-10-CM | POA: Diagnosis not present

## 2023-12-29 DIAGNOSIS — N2889 Other specified disorders of kidney and ureter: Secondary | ICD-10-CM | POA: Diagnosis not present

## 2024-02-06 ENCOUNTER — Other Ambulatory Visit: Payer: Self-pay | Admitting: Family Medicine

## 2024-02-06 DIAGNOSIS — F331 Major depressive disorder, recurrent, moderate: Secondary | ICD-10-CM

## 2024-02-13 ENCOUNTER — Other Ambulatory Visit: Payer: Self-pay | Admitting: Family Medicine

## 2024-02-13 ENCOUNTER — Encounter: Payer: Self-pay | Admitting: Family Medicine

## 2024-02-13 ENCOUNTER — Ambulatory Visit: Payer: BC Managed Care – PPO | Admitting: Family Medicine

## 2024-02-13 VITALS — BP 138/88 | HR 74 | Resp 16 | Ht 62.0 in | Wt 186.2 lb

## 2024-02-13 DIAGNOSIS — G43109 Migraine with aura, not intractable, without status migrainosus: Secondary | ICD-10-CM

## 2024-02-13 DIAGNOSIS — E538 Deficiency of other specified B group vitamins: Secondary | ICD-10-CM | POA: Diagnosis not present

## 2024-02-13 DIAGNOSIS — E559 Vitamin D deficiency, unspecified: Secondary | ICD-10-CM

## 2024-02-13 DIAGNOSIS — K861 Other chronic pancreatitis: Secondary | ICD-10-CM

## 2024-02-13 DIAGNOSIS — J4540 Moderate persistent asthma, uncomplicated: Secondary | ICD-10-CM

## 2024-02-13 DIAGNOSIS — I10 Essential (primary) hypertension: Secondary | ICD-10-CM | POA: Diagnosis not present

## 2024-02-13 DIAGNOSIS — R7303 Prediabetes: Secondary | ICD-10-CM

## 2024-02-13 DIAGNOSIS — F330 Major depressive disorder, recurrent, mild: Secondary | ICD-10-CM

## 2024-02-13 MED ORDER — LOSARTAN POTASSIUM-HCTZ 100-12.5 MG PO TABS: 1.0000 | ORAL_TABLET | Freq: Every day | ORAL | 1 refills | Status: DC

## 2024-02-13 MED ORDER — QULIPTA 30 MG PO TABS: 1.0000 | ORAL_TABLET | Freq: Every day | ORAL | 2 refills | Status: DC

## 2024-02-13 MED ORDER — CARIPRAZINE HCL 1.5 MG PO CAPS: 1.5000 mg | ORAL_CAPSULE | Freq: Every day | ORAL | 0 refills | Status: DC

## 2024-02-13 NOTE — Progress Notes (Signed)
 Name: Tammy Swanson   MRN: [id number]    DOB: 10/02/1966   Date:02/13/2024       Progress Note  Subjective  Chief Complaint  Chief Complaint  Patient presents with   Medical Management of Chronic Issues   HPI   Chronic idiopathic pancreatis: hospitalized October 27 th for 10 days, she is  under the care of [facility], had repeat CT done 12/02 ( results below ) new incidental findings of possible kidney infarct versus infiltrate, she had repeat CT and still has fluids surrounding pancreas and another lesion on kidney and will go back for repeat studies. She continues to have intermittent abdominal pain, feels like she is hit by a horse intermittently. Pain is now on RUQ recently seen by GI    Depression Mild Recurrent: she was  going to a [facility] however lost to follow up due to change in insurance . Husband asked to separate from her in July 22- he told her he was cheating on her ( he lost his job this past Spring). She also had pancreatitis and still dealing with her personal medical problems in 2024. Taking Effexor for migraine and depression. She is getting more moody lately, she does not feel good about the way she looks due to weight gain. She states not sleeping well, can go days without sleeping. Discussed mood stabilizer Discussed Vraylar . Discussed resuming therapy    Headaches: she has a long  history of migraines as an young adult, she was doing well, however last Fall 22 she noticed  symptoms returned. We tried Topamax and it was working but it caused brain fog and because of severe symptoms  she was referred to Dr. Malvin Johns, she is now off Topamax and taking Nortriptyline 50 mg every night , higher dose of Effexor, Nadolol   Migraine is usually throbbing, severe, usually right side of head, Bernita Raisin worked great but not covered by [employer]. She has aura scotomas prior to episodes. She has 1 episode per week and lasts over 24 hours. Taking Maxalt, we will try to get  her approved for Qlipita    HTN: bp is towards high end of normal, she states usually controlled at home..  She is taking Losartan hctz and Nadolol. She denies chest pain, SOB  or  palpitation.   Pre-diabetes: no polyphagia, polydipsia or polyuria. Last A1C was 6.2 %    Vitamin D and B12 deficiency: doing well on otc supplementation .Last levels within normal limits   GERD/Chronic gastritis: she is feeling much better since taking Omeprazole, she takes it daily .    Asthma mild  persistent  she states present in the Spring and Fall.  She states better controlled since she started to take singulair daily, now she is taking trelegy prn only. Currently no  wheezing, cough or SOB. Continue medication  Obesity: : BMI  is now below 35, she lost a lot of weight but is now gradually gaining weight again.    Patient Active Problem List   Diagnosis Date Noted   Mild persistent asthma without complication 08/29/2023   Idiopathic chronic pancreatitis (HCC) 08/29/2023   Abnormal finding on diagnostic imaging of right kidney 11/29/2022   Moderate persistent asthma without complication 11/29/2022   Vitamin D deficiency 11/29/2022   B12 deficiency 11/29/2022   Mild protein-calorie malnutrition (HCC) 10/21/2022   Post-ERCP acute pancreatitis 10/21/2022   History of pancreatitis 08/23/2022   Chronic superficial gastritis without bleeding 08/23/2022   Migraine with  aura and without status migrainosus, not intractable 08/23/2022   Moderate episode of recurrent major depressive disorder (HCC) 08/23/2022   Hypercalcemia 08/23/2022   Polyp of transverse colon    Transient confusion 05/25/2020   Headache disorder 10/22/2019   Sleep disorder 10/22/2019   History of carpal tunnel surgery of right wrist 12/06/2016   Prediabetes 10/15/2016   History of liver failure 08/24/2016   Migraine without aura and without status migrainosus, not intractable 09/10/2015   Eczema 09/10/2015   Bad memory 06/01/2015    Menopause 06/01/2015   Cough variant asthma 06/01/2015   Obesity (BMI 30-39.9) 06/01/2015   Nodule, subcutaneous 06/01/2015   Lesion of ulnar nerve 06/01/2015   Vitiligo 06/01/2015   Essential hypertension 09/03/2010    Past Surgical History:  Procedure Laterality Date   ABDOMINAL EXPLORATION SURGERY     x3   ABDOMINAL HYSTERECTOMY  2013   ABDOMINAL HYSTERECTOMY  2014   CARPAL TUNNEL RELEASE Right 12/06/2016   Procedure: CARPAL TUNNEL RELEASE;  Surgeon: Kennedy Bucker, MD;  Location: [facility];  Service: Orthopedics;  Laterality: Right;   CHOLECYSTECTOMY     COLONOSCOPY WITH PROPOFOL N/A 07/19/2021   Procedure: COLONOSCOPY WITH BIOPSY;  Surgeon: Midge Minium, MD;  Location: [facility];  Service: Endoscopy;  Laterality: N/A;   ESOPHAGOGASTRODUODENOSCOPY (EGD) WITH PROPOFOL N/A 07/19/2021   Procedure: ESOPHAGOGASTRODUODENOSCOPY (EGD) WITH BIOPSY;  Surgeon: Midge Minium, MD;  Location: [facility];  Service: Endoscopy;  Laterality: N/A;  Latex   FOOT SURGERY     FOOT SURGERY Left 2007/2009   Dr. Orland Jarred   KNEE ARTHROSCOPY Left 1985   KNEE SURGERY Left    MANDIBLE FRACTURE SURGERY     NASAL SINUS SURGERY     NASAL SINUS SURGERY  2012   POLYPECTOMY N/A 07/19/2021   Procedure: POLYPECTOMY;  Surgeon: Midge Minium, MD;  Location: [facility];  Service: Endoscopy;  Laterality: N/A;   RECONSTRUCTION MANDIBLE / MAXILLA  1992   TONSILLECTOMY     TONSILLECTOMY  1981   TRIGGER FINGER RELEASE      Family History  Problem Relation Age of Onset   Hypertension Mother    Cancer Mother        Breast, colon   HIV/AIDS Mother    Breast cancer Mother    Cancer Father        lung cancer   Hypertension Sister    Hypertension Maternal Grandmother    Hypertension Maternal Grandfather    Bipolar disorder Daughter    Cancer Maternal Aunt        3 sisters with Breast Cancer   Breast cancer Maternal Aunt    Breast cancer Maternal Aunt    Cancer Paternal Aunt    Cancer  Paternal Uncle     Social History   Tobacco Use   Smoking status: Never   Smokeless tobacco: Never  Substance Use Topics   Alcohol use: Yes    Comment: occasionally     Current Outpatient Medications:    albuterol (VENTOLIN HFA) 108 (90 Base) MCG/ACT inhaler, TAKE 2 PUFFS BY MOUTH EVERY 6 HOURS AS NEEDED FOR WHEEZE OR SHORTNESS OF BREATH, Disp: 18 each, Rfl: 0   cholecalciferol (VITAMIN D) 1000 units tablet, Take 1,000 Units by mouth daily., Disp: , Rfl:    Cyanocobalamin (B-12) 1000 MCG SUBL, Place 1 tablet under the tongue daily., Disp: , Rfl:    estradiol (VIVELLE-DOT) 0.05 MG/24HR patch, APPLY 1 PATCH TWICE WEEKLY, Disp: 24 patch, Rfl: 0   Fluticasone-Umeclidin-Vilant (  TRELEGY ELLIPTA) 100-62.5-25 MCG/ACT AEPB, Inhale 1 puff into the lungs daily., Disp: 3 each, Rfl: 1   lipase/protease/amylase (CREON) 36000 UNITS CPEP capsule, Take 2 capsules (72,000 Units total) by mouth 3 (three) times daily with meals. May also take 1 capsule (36,000 Units total) as needed (with snacks)., Disp: 720 capsule, Rfl: 1   losartan-hydrochlorothiazide (HYZAAR) 100-12.5 MG tablet, Take 1 tablet by mouth daily., Disp: 90 tablet, Rfl: 1   modafinil (PROVIGIL) 100 MG tablet, TAKE 1 TABLET(100 MG) BY MOUTH DAILY, Disp: 90 tablet, Rfl: 0   montelukast (SINGULAIR) 10 MG tablet, TAKE 1 TABLET BY MOUTH EVERY DAY AT BEDTIME, Disp: 90 tablet, Rfl: 1   nadolol (CORGARD) 20 MG tablet, Take 1 tablet (20 mg total) by mouth daily. In place of Atenolol, Disp: 90 tablet, Rfl: 1   nortriptyline (PAMELOR) 50 MG capsule, Take 50 mg by mouth at bedtime., Disp: , Rfl:    omeprazole (PRILOSEC) 20 MG capsule, TAKE 1 CAPSULE BY MOUTH EVERY DAY BEFORE BREAKFAST, Disp: 90 capsule, Rfl: 1   rizatriptan (MAXALT) 10 MG tablet, Take by mouth., Disp: , Rfl:    tretinoin (RETIN-A) 0.025 % cream, SMARTSIG:Sparingly Topical Every Other Day, Disp: , Rfl:    venlafaxine (EFFEXOR) 75 MG tablet, Take 225 mg by mouth daily at 12 noon., Disp: ,  Rfl:   Allergies  Allergen Reactions   Latex Anaphylaxis   Belviq [Lorcaserin Hcl]     Dry mouth, palpitation   Tomato Hives    "Cherry tomatoes"   Vicodin [Hydrocodone-Acetaminophen] Hives   Imitrex [Sumatriptan] Palpitations    Intolerance     I personally reviewed active problem list, medication list, allergies, family history with the patient/caregiver today.   ROS  Ten systems reviewed and is negative except as mentioned in HPI    Objective  Vitals:   02/13/24 1531  BP: 138/88  Pulse: 74  Resp: 16  SpO2: 98%  Weight: 186 lb 3.2 oz (84.5 kg)  Height: 5\' 2"  (1.575 m)    Body mass index is 34.06 kg/m.  Physical Exam  Constitutional: Patient appears well-developed and well-nourished. Obese  No distress.  HEENT: head atraumatic, normocephalic, pupils equal and reactive to light, neck supple, Cardiovascular: Normal rate, regular rhythm and normal heart sounds.  No murmur heard. No BLE edema. Pulmonary/Chest: Effort normal and breath sounds normal. No respiratory distress. Abdominal: Soft.  There is no tenderness. Psychiatric: Patient has a normal mood and affect. behavior is normal. Judgment and thought content normal.    Diabetic Foot Exam:     PHQ2/9:    02/13/2024    3:30 PM 11/15/2023    7:39 AM 08/29/2023    2:19 PM 06/05/2023    1:53 PM 04/10/2023   10:18 AM  Depression screen PHQ 2/9  Decreased Interest 1 1 1  0 0  Down, Depressed, Hopeless 1 1 1 2  0  PHQ - 2 Score 2 2 2 2  0  Altered sleeping 1 0 2 2 0  Tired, decreased energy 1 0 1 2 0  Change in appetite 1 0 1 1 0  Feeling bad or failure about yourself  1 1 0 1 0  Trouble concentrating 0 1 1 3  0  Moving slowly or fidgety/restless 0 0 0 0 0  Suicidal thoughts 0 0 0 0 0  PHQ-9 Score 6 4 7 11  0  Difficult doing work/chores Very difficult Somewhat difficult Not difficult at all  Not difficult at all    phq 9 is positive  Fall Risk:    11/15/2023    7:34 AM 08/29/2023    2:19 PM 06/05/2023     1:53 PM 04/10/2023   10:18 AM 02/28/2023    3:20 PM  Fall Risk   Falls in the past year? 0 0 0 0 0  Number falls in past yr: 0  0 0 0  Injury with Fall? 0  0 0 0  Risk for fall due to : No Fall Risks No Fall Risks No Fall Risks  No Fall Risks  Follow up Falls prevention discussed;Education provided;Falls evaluation completed Falls prevention discussed Falls prevention discussed  Falls prevention discussed     Assessment & Plan  1. Idiopathic chronic pancreatitis (HCC) (Primary)  Keep visits with GI  2. Depression, major, recurrent, mild (HCC)  - cariprazine (VRAYLAR) 1.5 MG capsule; Take 1 capsule (1.5 mg total) by mouth daily.  Dispense: 90 capsule; Refill: 0  3. B12 deficiency  Contineuesupplementation   4. Essential hypertension  - losartan-hydrochlorothiazide (HYZAAR) 100-12.5 MG tablet; Take 1 tablet by mouth daily.  Dispense: 90 tablet; Refill: 1  5. Migraine with aura and without status migrainosus, not intractable  - Atogepant (QULIPTA) 30 MG TABS; Take 1 tablet (30 mg total) by mouth daily at 12 noon.  Dispense: 30 tablet; Refill: 2  6. Prediabetes  Eating healthy and exercising  7. Moderate persistent asthma without complication  Continue medication   8. Vitamin D deficiency  Continue supplementation

## 2024-02-14 NOTE — Telephone Encounter (Signed)
 Pt has not tried coupon yet

## 2024-03-07 DIAGNOSIS — R109 Unspecified abdominal pain: Secondary | ICD-10-CM | POA: Diagnosis not present

## 2024-03-07 DIAGNOSIS — R140 Abdominal distension (gaseous): Secondary | ICD-10-CM | POA: Diagnosis not present

## 2024-04-02 ENCOUNTER — Ambulatory Visit: Admitting: Family Medicine

## 2024-04-09 ENCOUNTER — Other Ambulatory Visit: Payer: Self-pay | Admitting: Family Medicine

## 2024-04-09 ENCOUNTER — Other Ambulatory Visit: Payer: Self-pay | Admitting: Internal Medicine

## 2024-04-09 DIAGNOSIS — J4530 Mild persistent asthma, uncomplicated: Secondary | ICD-10-CM

## 2024-04-12 NOTE — Telephone Encounter (Signed)
 Duplicate request, Rx ordered 04/09/24. Requested Prescriptions  Pending Prescriptions Disp Refills   montelukast  (SINGULAIR ) 10 MG tablet [Pharmacy Med Name: MONTELUKAST  10MG  TABLETS] 30 tablet     Sig: TAKE 1 TABLET BY MOUTH EVERY DAY AT BEDTIME     Pulmonology:  Leukotriene Inhibitors Passed - 04/12/2024 10:24 AM      Passed - Valid encounter within last 12 months    Recent Outpatient Visits           1 month ago Idiopathic chronic pancreatitis [facility])   [place] [facility] Arleen Lacer, MD       Future Appointments             In 1 month Sowles, Krichna, MD [facility] [place] [facility]

## 2024-05-17 ENCOUNTER — Ambulatory Visit: Admitting: Family Medicine

## 2024-05-20 ENCOUNTER — Encounter: Payer: Self-pay | Admitting: Family Medicine

## 2024-05-20 ENCOUNTER — Ambulatory Visit: Admitting: Family Medicine

## 2024-05-20 VITALS — BP 132/84 | HR 94 | Resp 16 | Ht 62.0 in | Wt 186.1 lb

## 2024-05-20 DIAGNOSIS — F330 Major depressive disorder, recurrent, mild: Secondary | ICD-10-CM | POA: Diagnosis not present

## 2024-05-20 DIAGNOSIS — K2930 Chronic superficial gastritis without bleeding: Secondary | ICD-10-CM

## 2024-05-20 DIAGNOSIS — R7303 Prediabetes: Secondary | ICD-10-CM

## 2024-05-20 DIAGNOSIS — K861 Other chronic pancreatitis: Secondary | ICD-10-CM | POA: Diagnosis not present

## 2024-05-20 DIAGNOSIS — M2550 Pain in unspecified joint: Secondary | ICD-10-CM

## 2024-05-20 DIAGNOSIS — G43109 Migraine with aura, not intractable, without status migrainosus: Secondary | ICD-10-CM

## 2024-05-20 DIAGNOSIS — R400 Somnolence: Secondary | ICD-10-CM

## 2024-05-20 DIAGNOSIS — J4540 Moderate persistent asthma, uncomplicated: Secondary | ICD-10-CM

## 2024-05-20 DIAGNOSIS — I10 Essential (primary) hypertension: Secondary | ICD-10-CM

## 2024-05-20 DIAGNOSIS — Z23 Encounter for immunization: Secondary | ICD-10-CM | POA: Diagnosis not present

## 2024-05-20 MED ORDER — PANCRELIPASE (LIP-PROT-AMYL) 36000-114000 UNITS PO CPEP: ORAL_CAPSULE | ORAL | 1 refills | Status: AC

## 2024-05-20 MED ORDER — MODAFINIL 100 MG PO TABS: 100.0000 mg | ORAL_TABLET | ORAL | 0 refills | Status: DC

## 2024-05-20 MED ORDER — NADOLOL 20 MG PO TABS: 20.0000 mg | ORAL_TABLET | Freq: Every day | ORAL | 1 refills | Status: DC

## 2024-05-20 MED ORDER — AIRSUPRA 90-80 MCG/ACT IN AERO: 2.0000 | INHALATION_SPRAY | Freq: Four times a day (QID) | RESPIRATORY_TRACT | 0 refills | Status: AC | PRN

## 2024-05-20 NOTE — Progress Notes (Signed)
 Name: Tammy Swanson   MRN: [id number]    DOB: 1966-01-17   Date:05/20/2024       Progress Note  Subjective  Chief Complaint  Chief Complaint  Patient presents with   Medical Management of Chronic Issues   Discussed the use of AI scribe software for clinical note transcription with the patient, who gave verbal consent to proceed.  History of Present Illness Tammy Swanson is a 58 year old female who presents for a regular follow-up and pneumonia vaccination.  She experiences significant fatigue over the past two days despite adequate sleep. She denies sleep apnea, snoring, or pauses during sleep. She takes modafinil  for energy due to depression.  She has idiopathic chronic pancreatitis with recent nausea and back pain. She conserves Creon  due to cost, taking it once or twice daily based on meals.  She experiences frequent migraines, worsening over the past three weeks. She has not taken Qulipta  or nortriptyline due to uncollected prescriptions but continues nadolol  for prevention and blood pressure management.  She has mild recurring depression with mental fogginess and reluctance to share symptoms with family. She takes Effexor  that is prescribed by neurologist but is not taking Vraylar  ( did not pick it up from pharmacy)   She has moderate persistent asthma, using Trelegy inconsistently but takes Singulair  regularly, which improves symptoms. No Trelegy use this week.   She reports joint pain, particularly in knees and hands, with aching in fingers. No trigger finger but significant discomfort in middle and forefingers.  She has chronic superficial gastritis with nausea but no stomach pain. Appetite fluctuates, and she finds it challenging to avoid carbohydrates. She takes omeprazole  once daily.  She has prediabetes and attempts to avoid high-carbohydrate foods, though they are easier to digest given her gastritis.    Patient Active Problem List   Diagnosis Date Noted    Mild persistent asthma without complication 08/29/2023   Idiopathic chronic pancreatitis (HCC) 08/29/2023   Abnormal finding on diagnostic imaging of right kidney 11/29/2022   Moderate persistent asthma without complication 11/29/2022   Vitamin D  deficiency 11/29/2022   B12 deficiency 11/29/2022   Mild protein-calorie malnutrition (HCC) 10/21/2022   Post-ERCP acute pancreatitis 10/21/2022   History of pancreatitis 08/23/2022   Chronic superficial gastritis without bleeding 08/23/2022   Migraine with aura and without status migrainosus, not intractable 08/23/2022   Moderate episode of recurrent major depressive disorder (HCC) 08/23/2022   Hypercalcemia 08/23/2022   Polyp of transverse colon    Transient confusion 05/25/2020   Headache disorder 10/22/2019   Sleep disorder 10/22/2019   History of carpal tunnel surgery of right wrist 12/06/2016   Prediabetes 10/15/2016   History of liver failure 08/24/2016   Migraine without aura and without status migrainosus, not intractable 09/10/2015   Eczema 09/10/2015   Bad memory 06/01/2015   Menopause 06/01/2015   Cough variant asthma 06/01/2015   Obesity (BMI 30-39.9) 06/01/2015   Nodule, subcutaneous 06/01/2015   Lesion of ulnar nerve 06/01/2015   Vitiligo 06/01/2015   Essential hypertension 09/03/2010    Past Surgical History:  Procedure Laterality Date   ABDOMINAL EXPLORATION SURGERY     x3   ABDOMINAL HYSTERECTOMY  2013   ABDOMINAL HYSTERECTOMY  2014   CARPAL TUNNEL RELEASE Right 12/06/2016   Procedure: CARPAL TUNNEL RELEASE;  Surgeon: Molli Angelucci, MD;  Location: [facility];  Service: Orthopedics;  Laterality: Right;   CHOLECYSTECTOMY     COLONOSCOPY WITH PROPOFOL  N/A 07/19/2021   Procedure: COLONOSCOPY WITH BIOPSY;  Surgeon:  Marnee Sink, MD;  Location: [facility];  Service: Endoscopy;  Laterality: N/A;   ESOPHAGOGASTRODUODENOSCOPY (EGD) WITH PROPOFOL  N/A 07/19/2021   Procedure: ESOPHAGOGASTRODUODENOSCOPY (EGD) WITH  BIOPSY;  Surgeon: Marnee Sink, MD;  Location: [facility];  Service: Endoscopy;  Laterality: N/A;  Latex   FOOT SURGERY     FOOT SURGERY Left 2007/2009   Dr. Ulanda Gambles   KNEE ARTHROSCOPY Left 1985   KNEE SURGERY Left    MANDIBLE FRACTURE SURGERY     NASAL SINUS SURGERY     NASAL SINUS SURGERY  2012   POLYPECTOMY N/A 07/19/2021   Procedure: POLYPECTOMY;  Surgeon: Marnee Sink, MD;  Location: [facility];  Service: Endoscopy;  Laterality: N/A;   RECONSTRUCTION MANDIBLE / MAXILLA  1992   TONSILLECTOMY     TONSILLECTOMY  1981   TRIGGER FINGER RELEASE      Family History  Problem Relation Age of Onset   Hypertension Mother    Cancer Mother        Breast, colon   HIV/AIDS Mother    Breast cancer Mother    Cancer Father        lung cancer   Hypertension Sister    Hypertension Maternal Grandmother    Hypertension Maternal Grandfather    Bipolar disorder Daughter    Cancer Maternal Aunt        3 sisters with Breast Cancer   Breast cancer Maternal Aunt    Breast cancer Maternal Aunt    Cancer Paternal Aunt    Cancer Paternal Uncle     Social History   Tobacco Use   Smoking status: Never   Smokeless tobacco: Never  Substance Use Topics   Alcohol use: Yes    Comment: occasionally     Current Outpatient Medications:    Albuterol -Budesonide  (AIRSUPRA) 90-80 MCG/ACT AERO, Inhale 2 puffs into the lungs 4 (four) times daily as needed., Disp: 32.1 g, Rfl: 0   cholecalciferol (VITAMIN D ) 1000 units tablet, Take 1,000 Units by mouth daily., Disp: , Rfl:    Cyanocobalamin  (B-12) 1000 MCG SUBL, Place 1 tablet under the tongue daily., Disp: , Rfl:    estradiol  (VIVELLE -DOT) 0.05 MG/24HR patch, APPLY 1 PATCH TWICE WEEKLY, Disp: 24 patch, Rfl: 0   losartan -hydrochlorothiazide  (HYZAAR) 100-12.5 MG tablet, Take 1 tablet by mouth daily., Disp: 90 tablet, Rfl: 1   montelukast  (SINGULAIR ) 10 MG tablet, TAKE 1 TABLET BY MOUTH EVERY DAY AT BEDTIME, Disp: 90 tablet, Rfl: 0    nortriptyline (PAMELOR) 50 MG capsule, Take 50 mg by mouth at bedtime., Disp: , Rfl:    omeprazole  (PRILOSEC) 20 MG capsule, TAKE 1 CAPSULE BY MOUTH EVERY DAY BEFORE BREAKFAST, Disp: 90 capsule, Rfl: 1   rizatriptan (MAXALT) 10 MG tablet, Take by mouth., Disp: , Rfl:    tretinoin (RETIN-A) 0.025 % cream, SMARTSIG:Sparingly Topical Every Other Day, Disp: , Rfl:    venlafaxine  (EFFEXOR ) 75 MG tablet, Take 225 mg by mouth daily at 12 noon., Disp: , Rfl:    Atogepant  (QULIPTA ) 30 MG TABS, Take 1 tablet (30 mg total) by mouth daily at 12 noon. (Patient not taking: Reported on 05/20/2024), Disp: 30 tablet, Rfl: 2   cariprazine  (VRAYLAR ) 1.5 MG capsule, Take 1 capsule (1.5 mg total) by mouth daily. (Patient not taking: Reported on 05/20/2024), Disp: 90 capsule, Rfl: 0   lipase/protease/amylase (CREON ) 36000 UNITS CPEP capsule, Take 2 capsules (72,000 Units total) by mouth 3 (three) times daily with meals. May also take 1 capsule (36,000 Units total)  as needed (with snacks)., Disp: 720 capsule, Rfl: 1   modafinil  (PROVIGIL ) 100 MG tablet, Take 1 tablet (100 mg total) by mouth every morning., Disp: 90 tablet, Rfl: 0   nadolol  (CORGARD ) 20 MG tablet, Take 1 tablet (20 mg total) by mouth daily. In place of Atenolol , Disp: 90 tablet, Rfl: 1  Allergies  Allergen Reactions   Latex Anaphylaxis   Belviq [Lorcaserin  Hcl]     Dry mouth, palpitation   Tomato Hives    "Cherry tomatoes"   Vicodin [Hydrocodone-Acetaminophen ] Hives   Imitrex  [Sumatriptan ] Palpitations    Intolerance     I personally reviewed active problem list, medication list, allergies, family history with the patient/caregiver today.   ROS  Ten systems reviewed and is negative except as mentioned in HPI    Objective Physical Exam Constitutional: Patient appears well-developed and well-nourished. Obese  No distress.  HEENT: head atraumatic, normocephalic, pupils equal and reactive to light, neck supple Cardiovascular: Normal rate, regular  rhythm and normal heart sounds.  No murmur heard. No BLE edema. Pulmonary/Chest: Effort normal and breath sounds normal. No respiratory distress. Abdominal: Soft.  There is diffuse abdomina  tenderness, worse on epigastric area . Muscular skeletal: some pain and swelling on middle and 4 th fingers left hand  Psychiatric: Patient has a normal mood and affect. behavior is normal. Judgment and thought content normal.     Vitals:   05/20/24 1434  BP: 132/84  Pulse: 94  Resp: 16  SpO2: 99%  Weight: 186 lb 1.6 oz (84.4 kg)  Height: 5\' 2"  (1.575 m)    Body mass index is 34.04 kg/m.    PHQ2/9:    05/20/2024    2:33 PM 02/13/2024    3:30 PM 11/15/2023    7:39 AM 08/29/2023    2:19 PM 06/05/2023    1:53 PM  Depression screen PHQ 2/9  Decreased Interest 0 1 1 1  0  Down, Depressed, Hopeless 0 1 1 1 2   PHQ - 2 Score 0 2 2 2 2   Altered sleeping 0 1 0 2 2  Tired, decreased energy 0 1 0 1 2  Change in appetite 0 1 0 1 1  Feeling bad or failure about yourself  0 1 1 0 1  Trouble concentrating 0 0 1 1 3   Moving slowly or fidgety/restless 0 0 0 0 0  Suicidal thoughts 0 0 0 0 0  PHQ-9 Score 0 6 4 7 11   Difficult doing work/chores Not difficult at all Very difficult Somewhat difficult Not difficult at all     phq 9 is negative  Fall Risk:    05/20/2024    2:29 PM 11/15/2023    7:34 AM 08/29/2023    2:19 PM 06/05/2023    1:53 PM 04/10/2023   10:18 AM  Fall Risk   Falls in the past year? 0 0 0 0 0  Number falls in past yr: 0 0  0 0  Injury with Fall? 0 0  0 0  Risk for fall due to : No Fall Risks No Fall Risks No Fall Risks No Fall Risks   Follow up Falls prevention discussed;Education provided;Falls evaluation completed Falls prevention discussed;Education provided;Falls evaluation completed Falls prevention discussed Falls prevention discussed      Assessment & Plan Idiopathic Chronic Pancreatitis Concerns of flare-up due to back pain and nausea. Emphasized Creon  adherence to aid  digestion and prevent symptoms. - Check lipase levels. - Prescribe Creon , instruct to take three times daily with  meals. - Advise emergency room visit if symptoms worsen or signs of infection appear. - Follow up with GI specialist if symptoms persist or worsen.  Chronic Superficial Gastritis Nausea and epigastric tenderness suggest current omeprazole  dose may be insufficient. Discussed potential ulcer and need for GI follow-up. - Increase omeprazole  to twice daily  - Monitor symptoms and adjust treatment based on lipase results. - Follow up with GI specialist if symptoms persist or worsen.  Migraine with Aura Frequent episodes due to misplaced prescriptions. Emphasized medication adherence to prevent migraines. - Advise to pick up Qulipta  and nortriptyline prescriptions. - Continue nadolol  for migraine prevention.  Mild Recurrent Depression Mental fogginess and fatigue. Emphasized medication adherence to improve symptoms. - Advise to pick up Vraylar  prescription. - Continue Effexor . - Encourage adherence to medication regimen.  Asthma, Moderate Persistent Improvement with Singulair , inconsistent Trelegy use. Discussed switching to Airsupra for flexible management. - Switch from Trelegy to Airsupra inhaler as needed. - Continue Singulair  daily.  Hypertension Blood pressure slightly elevated at 132/84 mmHg, possibly due to current health issues. Current regimen appears effective. - Continue current antihypertensive regimen.  Possible Sleep Apnea Excessive daytime sleepiness and fatigue suggest possible sleep apnea. Discussed need for a sleep study. - Order sleep study to evaluate for sleep apnea.  Joint Pain Joint pain in fingers, and knees possibly related to rheumatological disease. Discussed initial testing for autoimmune conditions. - Order autoimmune panel including rheumatoid factor, ANA, and C-reactive protein.  General Health Maintenance Request for pneumonia  vaccination. - Administer pneumonia vaccination.  Follow-up Emphasized importance of medication adherence and specialist follow-up as needed. - Ensure all prescriptions are picked up and adhered to. - Complete blood work including CBC and comprehensive panel.

## 2024-05-21 ENCOUNTER — Ambulatory Visit: Payer: Self-pay | Admitting: Family Medicine

## 2024-05-21 ENCOUNTER — Other Ambulatory Visit (HOSPITAL_COMMUNITY): Payer: Self-pay

## 2024-05-21 ENCOUNTER — Telehealth: Payer: Self-pay | Admitting: Pharmacy Technician

## 2024-05-21 NOTE — Telephone Encounter (Signed)
 Pharmacy Patient Advocate Encounter   Received notification from CoverMyMeds that prior authorization for Creon  36000-114000 UNIT dr capsules is required/requested.   Insurance verification completed.   The patient is insured through [facility] .   Per test claim: PA required; PA submitted to above mentioned insurance via CoverMyMeds Key/confirmation #/[id number] Status is pending

## 2024-05-21 NOTE — Telephone Encounter (Signed)
 Pharmacy Patient Advocate Encounter  Received notification from [facility] that Prior Authorization for Creon  36000-114000 UNIT dr capsules has been APPROVED from 05/21/24 to 05/21/25. Unable to obtain price due to refill too soon rejection, last fill date 05/21/24 next available fill date07/03/25   PA #/Case ID/Reference #: [id number]

## 2024-05-22 LAB — COMPREHENSIVE METABOLIC PANEL WITH GFR
AG Ratio: 1.1 (calc) (ref 1.0–2.5)
ALT: 14 U/L (ref 6–29)
AST: 15 U/L (ref 10–35)
Albumin: 4 g/dL (ref 3.6–5.1)
Alkaline phosphatase (APISO): 105 U/L (ref 37–153)
BUN: 13 mg/dL (ref 7–25)
CO2: 29 mmol/L (ref 20–32)
Calcium: 9.9 mg/dL (ref 8.6–10.4)
Chloride: 104 mmol/L (ref 98–110)
Creat: 0.73 mg/dL (ref 0.50–1.03)
Globulin: 3.6 g/dL (ref 1.9–3.7)
Glucose, Bld: 79 mg/dL (ref 65–99)
Potassium: 4.3 mmol/L (ref 3.5–5.3)
Sodium: 141 mmol/L (ref 135–146)
Total Bilirubin: 0.4 mg/dL (ref 0.2–1.2)
Total Protein: 7.6 g/dL (ref 6.1–8.1)
eGFR: 95 mL/min/{1.73_m2} (ref >=60)

## 2024-05-22 LAB — CBC WITH DIFFERENTIAL/PLATELET
Absolute Lymphocytes: 2090 {cells}/uL (ref 850–3900)
Absolute Monocytes: 694 {cells}/uL (ref 200–950)
Basophils Absolute: 39 {cells}/uL (ref 0–200)
Basophils Relative: 0.5 %
Eosinophils Absolute: 148 {cells}/uL (ref 15–500)
Eosinophils Relative: 1.9 %
HCT: 43.6 % (ref 35.0–45.0)
Hemoglobin: 14.3 g/dL (ref 11.7–15.5)
MCH: 28.2 pg (ref 27.0–33.0)
MCHC: 32.8 g/dL (ref 32.0–36.0)
MCV: 86 fL (ref 80.0–100.0)
MPV: 10.4 fL (ref 7.5–12.5)
Monocytes Relative: 8.9 %
Neutro Abs: 4828 {cells}/uL (ref 1500–7800)
Neutrophils Relative %: 61.9 %
Platelets: 353 10*3/uL (ref 140–400)
RBC: 5.07 10*6/uL (ref 3.80–5.10)
RDW: 13.3 % (ref 11.0–15.0)
Total Lymphocyte: 26.8 %
WBC: 7.8 10*3/uL (ref 3.8–10.8)

## 2024-05-22 LAB — SEDIMENTATION RATE: Sed Rate: 31 mm/h — ABNORMAL HIGH (ref 0–30)

## 2024-05-22 LAB — CYCLIC CITRUL PEPTIDE ANTIBODY, IGG: Cyclic Citrullin Peptide Ab: <16 U

## 2024-05-22 LAB — ANTI-NUCLEAR AB-TITER (ANA TITER): ANA Titer 1: 1:40 {titer} — ABNORMAL HIGH

## 2024-05-22 LAB — ANA: Anti Nuclear Antibody (ANA): POSITIVE — AB

## 2024-05-22 LAB — C-REACTIVE PROTEIN: CRP: 12.3 mg/L — ABNORMAL HIGH (ref <8.0)

## 2024-05-22 LAB — LIPASE: Lipase: 33 U/L (ref 7–60)

## 2024-05-22 LAB — RHEUMATOID FACTOR: Rheumatoid fact SerPl-aCnc: <10 [IU]/mL (ref <14)

## 2024-05-23 ENCOUNTER — Other Ambulatory Visit: Payer: Self-pay

## 2024-05-23 DIAGNOSIS — R400 Somnolence: Secondary | ICD-10-CM

## 2024-05-27 ENCOUNTER — Other Ambulatory Visit: Payer: Self-pay | Admitting: Family Medicine

## 2024-05-27 DIAGNOSIS — M2550 Pain in unspecified joint: Secondary | ICD-10-CM

## 2024-05-27 DIAGNOSIS — R768 Other specified abnormal immunological findings in serum: Secondary | ICD-10-CM

## 2024-05-30 DIAGNOSIS — R768 Other specified abnormal immunological findings in serum: Secondary | ICD-10-CM | POA: Diagnosis not present

## 2024-05-30 DIAGNOSIS — M2550 Pain in unspecified joint: Secondary | ICD-10-CM | POA: Diagnosis not present

## 2024-06-05 LAB — ANALYZER(R)ANA IFA WITH REFLEX TITER/PATTRN,SYS AUTOIMM PNL1
Anti Nuclear Antibody (ANA): NEGATIVE
Anticardiolipin IgA: <2 [APL'U]/mL
Anticardiolipin IgG: <2 [GPL'U]/mL
Anticardiolipin IgM: <2 [MPL'U]/mL
Beta-2 Glyco 1 IgA: <2 U/mL
Beta-2 Glyco 1 IgM: <2 U/mL
Beta-2 Glyco I IgG: <2 U/mL
C3 Complement: 192 mg/dL (ref 83–193)
C4 Complement: 27 mg/dL (ref 15–57)
Centromere Ab Screen: <1 AI
Chromatin (Nucleosomal) Antibody: <1 AI
Cyclic Citrullin Peptide Ab: <16 U
DNA Ab (DS) Crithidia, IFA: NEGATIVE
ENA SM Ab Ser-aCnc: <1 AI
Jo-1 Autoabs: <1 AI
MUTATED CITRULLINATED VIMENTIN (MCV) AB: <20 U/mL (ref <20)
Rheumatoid Factor (IgA): <5 U
Rheumatoid Factor (IgG): <5 U
Rheumatoid Factor (IgM): <5 U
Ribonucleic Protein(ENA) Antibody, IgG: <1 AI
SM/RNP: <1 AI
SSA (Ro) (ENA) Antibody, IgG: <1 AI
SSB (La) (ENA) Antibody, IgG: <1 AI
Scleroderma (Scl-70) (ENA) Antibody, IgG: <1 AI
Thyroperoxidase Ab SerPl-aCnc: <1 [IU]/mL (ref <9)

## 2024-06-06 ENCOUNTER — Ambulatory Visit: Payer: Self-pay | Admitting: Family Medicine

## 2024-06-27 DIAGNOSIS — K638219 Small intestinal bacterial overgrowth, unspecified: Secondary | ICD-10-CM | POA: Diagnosis not present

## 2024-06-27 DIAGNOSIS — R140 Abdominal distension (gaseous): Secondary | ICD-10-CM | POA: Diagnosis not present

## 2024-06-27 DIAGNOSIS — R109 Unspecified abdominal pain: Secondary | ICD-10-CM | POA: Diagnosis not present

## 2024-06-27 DIAGNOSIS — K63829 Intestinal methanogen overgrowth, unspecified: Secondary | ICD-10-CM | POA: Diagnosis not present

## 2024-07-10 ENCOUNTER — Other Ambulatory Visit: Payer: Self-pay | Admitting: Family Medicine

## 2024-07-10 DIAGNOSIS — I10 Essential (primary) hypertension: Secondary | ICD-10-CM

## 2024-08-08 ENCOUNTER — Telehealth: Payer: Self-pay

## 2024-08-08 DIAGNOSIS — R400 Somnolence: Secondary | ICD-10-CM

## 2024-08-08 NOTE — Telephone Encounter (Signed)
 Copied from CRM [id number]. Topic: Referral - Status >> Aug 08, 2024  1:44 PM Amy B wrote: Reason for CRM: [facility] called stating that the referral order for an in lab sleep study was denied but is approved for an at home study.  They request the referral be changed for an in-home sleep study.  Please fax to [phone].  Phone number [phone]

## 2024-08-08 NOTE — Telephone Encounter (Signed)
 Ok to proceed with home study?

## 2024-08-09 NOTE — Addendum Note (Signed)
 Addended by: RENTERIA-GARCIA, Burgandy Hackworth on: 08/09/2024 08:13 AM   Modules accepted: Orders

## 2024-08-09 NOTE — Telephone Encounter (Signed)
 In home sleep study referral placed

## 2024-08-20 ENCOUNTER — Ambulatory Visit: Admitting: Family Medicine

## 2024-08-21 ENCOUNTER — Other Ambulatory Visit: Payer: Self-pay | Admitting: Family Medicine

## 2024-08-21 DIAGNOSIS — J4530 Mild persistent asthma, uncomplicated: Secondary | ICD-10-CM

## 2024-09-03 ENCOUNTER — Ambulatory Visit: Admitting: Family Medicine

## 2024-09-03 ENCOUNTER — Encounter: Payer: Self-pay | Admitting: Family Medicine

## 2024-09-03 VITALS — BP 124/80 | HR 85 | Resp 16 | Ht 62.0 in | Wt 190.2 lb

## 2024-09-03 DIAGNOSIS — Z23 Encounter for immunization: Secondary | ICD-10-CM

## 2024-09-03 DIAGNOSIS — F331 Major depressive disorder, recurrent, moderate: Secondary | ICD-10-CM

## 2024-09-03 DIAGNOSIS — J4530 Mild persistent asthma, uncomplicated: Secondary | ICD-10-CM

## 2024-09-03 DIAGNOSIS — K638219 Small intestinal bacterial overgrowth, unspecified: Secondary | ICD-10-CM

## 2024-09-03 DIAGNOSIS — G43109 Migraine with aura, not intractable, without status migrainosus: Secondary | ICD-10-CM | POA: Diagnosis not present

## 2024-09-03 DIAGNOSIS — R7303 Prediabetes: Secondary | ICD-10-CM

## 2024-09-03 DIAGNOSIS — E559 Vitamin D deficiency, unspecified: Secondary | ICD-10-CM

## 2024-09-03 DIAGNOSIS — E538 Deficiency of other specified B group vitamins: Secondary | ICD-10-CM

## 2024-09-03 DIAGNOSIS — K861 Other chronic pancreatitis: Secondary | ICD-10-CM | POA: Diagnosis not present

## 2024-09-03 DIAGNOSIS — I10 Essential (primary) hypertension: Secondary | ICD-10-CM

## 2024-09-03 DIAGNOSIS — G894 Chronic pain syndrome: Secondary | ICD-10-CM

## 2024-09-03 MED ORDER — COVID-19 MRNA VAC-TRIS(PFIZER) 30 MCG/0.3ML IM SUSY: 0.3000 mL | PREFILLED_SYRINGE | Freq: Once | INTRAMUSCULAR | 0 refills | Status: AC

## 2024-09-03 MED ORDER — VENLAFAXINE HCL 75 MG PO TABS: 225.0000 mg | ORAL_TABLET | Freq: Every day | ORAL | 1 refills | Status: AC

## 2024-09-03 MED ORDER — MONTELUKAST SODIUM 10 MG PO TABS: 10.0000 mg | ORAL_TABLET | Freq: Every day | ORAL | 0 refills | Status: DC

## 2024-09-03 MED ORDER — RIZATRIPTAN BENZOATE 10 MG PO TABS: 10.0000 mg | ORAL_TABLET | ORAL | 1 refills | Status: AC | PRN

## 2024-09-03 MED ORDER — COVID-19 MRNA VAC-TRIS(PFIZER) 30 MCG/0.3ML IM SUSY: 0.3000 mL | PREFILLED_SYRINGE | Freq: Once | INTRAMUSCULAR | 0 refills | Status: DC

## 2024-09-03 MED ORDER — MODAFINIL 100 MG PO TABS: 100.0000 mg | ORAL_TABLET | ORAL | 0 refills | Status: DC

## 2024-09-03 MED ORDER — ZAVZPRET 10 MG/ACT NA SOLN: 1.0000 | Freq: Every day | NASAL | 1 refills | Status: DC | PRN

## 2024-09-03 MED ORDER — LOSARTAN POTASSIUM-HCTZ 100-12.5 MG PO TABS: 1.0000 | ORAL_TABLET | Freq: Every day | ORAL | 1 refills | Status: AC

## 2024-09-03 MED ORDER — NORTRIPTYLINE HCL 50 MG PO CAPS: 50.0000 mg | ORAL_CAPSULE | Freq: Every day | ORAL | 0 refills | Status: AC

## 2024-09-03 NOTE — Progress Notes (Signed)
 Name: Tammy Swanson   MRN: [id number]    DOB: [birth year]   Date:09/03/2024       Progress Note  Subjective  Chief Complaint  Chief Complaint  Patient presents with   Medical Management of Chronic Issues   Discussed the use of AI scribe software for clinical note transcription with the patient, who gave verbal consent to proceed.  History of Present Illness Tammy Swanson is a 58 year old female with idiopathic chronic pancreatitis and asthma who presents for a regular follow-up visit.  She experiences persistent nausea, back pain, lack of appetite, and diarrhea after meals. Pain is localized to the right side when standing and the left upper quadrant when lying down. These symptoms have been ongoing since her pancreatitis diagnosis. A breath test in July of this year confirmed small intestinal bacterial overgrowth (SIBO). Despite completing a 12-day course of doxycycline and using Iberogast capsules before meals, she continues to experience significant bloating and discomfort.  She takes Creon , typically one capsule before meals. Bloating and discomfort make it challenging for her to maintain a healthy diet, impacting her eating habits and weight management. Despite efforts, her weight has not decreased as expected.  Chronic pain related to pancreatitis is severe and constant, affecting her emotionally and contributing to feelings of depression. She has a history of major recurrent depression and is currently taking venlafaxine  225 mg daily. Her depression feels worse due to the pain, although the medication helps her remain calm.  She has  migraines, which were well-controlled with nortriptyline  until she ran out of the medication. She experienced an aura two days ago and uses Maxalt  at the onset of migraines.   Her asthma is managed with montelukast , which she finds effective, but she experienced symptoms when she ran out of the medication. She also has a history of obesity  and prediabetes.    Patient Active Problem List   Diagnosis Date Noted   Idiopathic chronic pancreatitis (HCC) 08/29/2023   Abnormal finding on diagnostic imaging of right kidney 11/29/2022   Moderate persistent asthma without complication 11/29/2022   Vitamin D  deficiency 11/29/2022   B12 deficiency 11/29/2022   Mild protein-calorie malnutrition 10/21/2022   Post-ERCP acute pancreatitis 10/21/2022   Chronic superficial gastritis without bleeding 08/23/2022   Migraine with aura and without status migrainosus, not intractable 08/23/2022   Moderate episode of recurrent major depressive disorder (HCC) 08/23/2022   Hypercalcemia 08/23/2022   Polyp of transverse colon    Transient confusion 05/25/2020   Headache disorder 10/22/2019   Sleep disorder 10/22/2019   History of carpal tunnel surgery of right wrist 12/06/2016   Prediabetes 10/15/2016   History of liver failure 08/24/2016   Migraine without aura and without status migrainosus, not intractable 09/10/2015   Eczema 09/10/2015   Bad memory 06/01/2015   Menopause 06/01/2015   Obesity (BMI 30-39.9) 06/01/2015   Nodule, subcutaneous 06/01/2015   Lesion of ulnar nerve 06/01/2015   Vitiligo 06/01/2015   Essential hypertension 09/03/2010    Past Surgical History:  Procedure Laterality Date   ABDOMINAL EXPLORATION SURGERY     x3   ABDOMINAL HYSTERECTOMY  2013   ABDOMINAL HYSTERECTOMY  2014   CARPAL TUNNEL RELEASE Right 12/06/2016   Procedure: CARPAL TUNNEL RELEASE;  Surgeon: Ozell Flake, MD;  Location: [facility];  Service: Orthopedics;  Laterality: Right;   CHOLECYSTECTOMY     COLONOSCOPY WITH PROPOFOL  N/A 07/19/2021   Procedure: COLONOSCOPY WITH BIOPSY;  Surgeon: Jinny Carmine, MD;  Location: [facility];  Service: Endoscopy;  Laterality: N/A;   ESOPHAGOGASTRODUODENOSCOPY (EGD) WITH PROPOFOL  N/A 07/19/2021   Procedure: ESOPHAGOGASTRODUODENOSCOPY (EGD) WITH BIOPSY;  Surgeon: Jinny Carmine, MD;  Location: [facility];   Service: Endoscopy;  Laterality: N/A;  Latex   FOOT SURGERY     FOOT SURGERY Left 2007/2009   Dr. Lilli   KNEE ARTHROSCOPY Left 1985   KNEE SURGERY Left    MANDIBLE FRACTURE SURGERY     NASAL SINUS SURGERY     NASAL SINUS SURGERY  2012   POLYPECTOMY N/A 07/19/2021   Procedure: POLYPECTOMY;  Surgeon: Jinny Carmine, MD;  Location: [facility];  Service: Endoscopy;  Laterality: N/A;   RECONSTRUCTION MANDIBLE / MAXILLA  1992   TONSILLECTOMY     TONSILLECTOMY  1981   TRIGGER FINGER RELEASE      Family History  Problem Relation Age of Onset   Hypertension Mother    Cancer Mother        Breast, colon   HIV/AIDS Mother    Breast cancer Mother    Cancer Father        lung cancer   Hypertension Sister    Hypertension Maternal Grandmother    Hypertension Maternal Grandfather    Bipolar disorder Daughter    Cancer Maternal Aunt        3 sisters with Breast Cancer   Breast cancer Maternal Aunt    Breast cancer Maternal Aunt    Cancer Paternal Aunt    Cancer Paternal Uncle     Social History   Tobacco Use   Smoking status: Never   Smokeless tobacco: Never  Substance Use Topics   Alcohol use: Yes    Comment: occasionally     Current Outpatient Medications:    Albuterol -Budesonide  (AIRSUPRA ) 90-80 MCG/ACT AERO, Inhale 2 puffs into the lungs 4 (four) times daily as needed., Disp: 32.1 g, Rfl: 0   cholecalciferol (VITAMIN D ) 1000 units tablet, Take 1,000 Units by mouth daily., Disp: , Rfl:    Cyanocobalamin  (B-12) 1000 MCG SUBL, Place 1 tablet under the tongue daily., Disp: , Rfl:    estradiol  (VIVELLE -DOT) 0.05 MG/24HR patch, APPLY 1 PATCH TWICE WEEKLY, Disp: 24 patch, Rfl: 0   lipase/protease/amylase (CREON ) 36000 UNITS CPEP capsule, Take 2 capsules (72,000 Units total) by mouth 3 (three) times daily with meals. May also take 1 capsule (36,000 Units total) as needed (with snacks)., Disp: 720 capsule, Rfl: 1   losartan -hydrochlorothiazide  (HYZAAR) 100-12.5 MG tablet, Take  1 tablet by mouth daily., Disp: 90 tablet, Rfl: 1   modafinil  (PROVIGIL ) 100 MG tablet, Take 1 tablet (100 mg total) by mouth every morning., Disp: 90 tablet, Rfl: 0   montelukast  (SINGULAIR ) 10 MG tablet, TAKE 1 TABLET BY MOUTH EVERY DAY AT BEDTIME, Disp: 90 tablet, Rfl: 0   nadolol  (CORGARD ) 20 MG tablet, Take 1 tablet (20 mg total) by mouth daily. In place of Atenolol , Disp: 90 tablet, Rfl: 1   nortriptyline  (PAMELOR ) 50 MG capsule, Take 50 mg by mouth at bedtime., Disp: , Rfl:    omeprazole  (PRILOSEC) 20 MG capsule, TAKE 1 CAPSULE BY MOUTH EVERY DAY BEFORE BREAKFAST, Disp: 90 capsule, Rfl: 1   rizatriptan  (MAXALT ) 10 MG tablet, Take by mouth., Disp: , Rfl:    tretinoin (RETIN-A) 0.025 % cream, SMARTSIG:Sparingly Topical Every Other Day, Disp: , Rfl:    venlafaxine  (EFFEXOR ) 75 MG tablet, Take 225 mg by mouth daily at 12 noon., Disp: , Rfl:    Atogepant  (QULIPTA ) 30 MG TABS, Take 1 tablet (  30 mg total) by mouth daily at 12 noon. (Patient not taking: Reported on 09/03/2024), Disp: 30 tablet, Rfl: 2   cariprazine  (VRAYLAR ) 1.5 MG capsule, Take 1 capsule (1.5 mg total) by mouth daily. (Patient not taking: Reported on 09/03/2024), Disp: 90 capsule, Rfl: 0  Allergies  Allergen Reactions   Latex Anaphylaxis   Belviq [Lorcaserin  Hcl]     Dry mouth, palpitation   Tomato Hives    Cherry tomatoes   Vicodin [Hydrocodone-Acetaminophen ] Hives   Imitrex  [Sumatriptan ] Palpitations    Intolerance     I personally reviewed active problem list, medication list, allergies, family history with the patient/caregiver today.   ROS  Ten systems reviewed and is negative except as mentioned in HPI    Objective Physical Exam  CONSTITUTIONAL: Patient appears well-developed and well-nourished.  No distress. HEENT: Head atraumatic, normocephalic, neck supple. CARDIOVASCULAR: Normal rate, regular rhythm and normal heart sounds.  No murmur heard. No BLE edema. PULMONARY: Effort normal and breath sounds  normal. No respiratory distress. ABDOMINAL: mild bloating, tenderness with palpation epigastric area MUSCULOSKELETAL: Normal gait. Without gross motor or sensory deficit. PSYCHIATRIC: Patient depressed, seemed tearful at times  Vitals:   09/03/24 0948  BP: 124/80  Pulse: 85  Resp: 16  SpO2: 96%  Weight: 190 lb 3.2 oz (86.3 kg)  Height: 5' 2 (1.575 m)    Body mass index is 34.79 kg/m.    PHQ2/9:    09/03/2024    9:45 AM 05/20/2024    2:33 PM 02/13/2024    3:30 PM 11/15/2023    7:39 AM 08/29/2023    2:19 PM  Depression screen PHQ 2/9  Decreased Interest 1 0 1 1 1   Down, Depressed, Hopeless 1 0 1 1 1   PHQ - 2 Score 2 0 2 2 2   Altered sleeping 1 0 1 0 2  Tired, decreased energy 1 0 1 0 1  Change in appetite 0 0 1 0 1  Feeling bad or failure about yourself  0 0 1 1 0  Trouble concentrating 0 0 0 1 1  Moving slowly or fidgety/restless 0 0 0 0 0  Suicidal thoughts 0 0 0 0 0  PHQ-9 Score 4 0 6 4 7   Difficult doing work/chores Somewhat difficult Not difficult at all Very difficult Somewhat difficult Not difficult at all    phq 9 is positive  Fall Risk:    09/03/2024    9:41 AM 05/20/2024    2:29 PM 11/15/2023    7:34 AM 08/29/2023    2:19 PM 06/05/2023    1:53 PM  Fall Risk   Falls in the past year? 0 0 0 0 0  Number falls in past yr: 0 0 0  0  Injury with Fall? 0 0 0  0  Risk for fall due to : No Fall Risks No Fall Risks No Fall Risks No Fall Risks No Fall Risks  Follow up Falls evaluation completed Falls prevention discussed;Education provided;Falls evaluation completed Falls prevention discussed;Education provided;Falls evaluation completed Falls prevention discussed Falls prevention discussed      Assessment & Plan Chronic idiopathic pancreatitis with chronic pain Persistent pain affecting emotional well-being and daily functioning. - Refer to pain clinic for pain management and potential nerve block. - Continue Creon  before meals. - Encourage dietary modifications  to improve nutrition and reduce pain.  Small intestinal bacterial overgrowth (SIBO) SIBO confirmed by positive breath test. Currently using Iberogast. - Continue Iberogast as per gastroenterologist's recommendation. - Follow up with gastroenterology  if symptoms persist.  Major depression, recurrent, moderate Depression exacerbated by chronic pain. Venlafaxine  aiding mood stabilization. - Continue venlafaxine  225 mg daily. - Prescribe nortriptyline  to aid with sleep and pain management and migraine prevention  - Encourage follow-up with neurologist for comprehensive management.  Migraine with aura Symptoms worsened post-nortriptyline  discontinuation. Recent aura indicates potential recurrence. - Prescribe nortriptyline  for migraine prevention and sleep. - Prescribe Maxalt  for acute migraine attacks. - Prescribe Zavzpret  nasal spray for severe migraine attacks.  Essential hypertension Hypertension well-controlled with current regimen. - Continue losartan  and hydrochlorothiazide  100/12.5 mg.  Asthma Mild persistent  Asthma well-controlled with montelukast . - Continue montelukast . - Ensure prescription refills are up to date.  Obesity Obesity impacting health. Dietary habits affecting weight and gastrointestinal symptoms. - Encourage dietary modifications to include more protein and reduce processed foods. - Discuss potential impact of diet on weight and gastrointestinal health.  Prediabetes Dietary habits contributing to risk. Emphasis on diet to manage blood sugar. - Encourage dietary modifications to include more protein and reduce processed foods.

## 2024-10-03 DIAGNOSIS — M65342 Trigger finger, left ring finger: Secondary | ICD-10-CM | POA: Diagnosis not present

## 2024-10-03 DIAGNOSIS — G5602 Carpal tunnel syndrome, left upper limb: Secondary | ICD-10-CM | POA: Diagnosis not present

## 2024-10-11 DIAGNOSIS — N951 Menopausal and female climacteric states: Secondary | ICD-10-CM | POA: Diagnosis not present

## 2024-10-11 DIAGNOSIS — Z1331 Encounter for screening for depression: Secondary | ICD-10-CM | POA: Diagnosis not present

## 2024-10-11 DIAGNOSIS — Z113 Encounter for screening for infections with a predominantly sexual mode of transmission: Secondary | ICD-10-CM | POA: Diagnosis not present

## 2024-10-11 DIAGNOSIS — Z1231 Encounter for screening mammogram for malignant neoplasm of breast: Secondary | ICD-10-CM | POA: Diagnosis not present

## 2024-10-11 DIAGNOSIS — Z01411 Encounter for gynecological examination (general) (routine) with abnormal findings: Secondary | ICD-10-CM | POA: Diagnosis not present

## 2024-10-11 NOTE — Progress Notes (Signed)
 Routine Annual Gynecology Examination   PCP: Glenard Dorette FALCON, MD  Chief Complaint:  Chief Complaint  Patient presents with  . Annual Exam    History of Present Illness:  Ms. Tammy Swanson is a 57 y.o. [id number] presents today for her annual examination.    Patient Concerns: - Elevated BP today, she did not take her medication.   Pertinent Hx: - SVD x2 - HTN - Chronic pancreatitis  - Depression   Menopausal bleeding: denies Menopausal symptoms: denies  She is not sexually active. She does not have vaginal dryness.  Breast symptoms: denies  Screening: - Last Pap: 05/22/ 2023 Results were: no abnormalities /neg HPV DNA.  - Last mammogram: 01/10/2023.  Results were: normal--routine follow-up in 12 months.  She is aware of how her breast look and feel. Denies any breast concerns.  - Breast Density: A - Almost entirely fatty - Family hx of breast cancer: reports mother, sister and maternal aunts.  - Family hx of ovarian cancer: denies - Family hx of colon cancer: denies - Colon Cancer Screening: Colonoscopy completed on 07/19/2021. Repeat  , due  . - DEXA: has not been screened for osteoporosis  Social Hx: Marital Status: single Tobacco use: The patient denies current or previous tobacco use. Alcohol use: none Exercise: not active  She does get adequate calcium and Vitamin D  in her diet.  The patient wears seatbelts: yes. The patient reports that domestic violence in her life is absent.   PMH:  Past Medical History:  Diagnosis Date  . Asthma without status asthmaticus (HHS-HCC)   . Fibroid    has had hysterectomy  . Hypertension   . Migraines   . Pancreatitis (HHS-HCC)     Past Surgical History:  Procedure Laterality Date  . HYSTERECTOMY  2014   partial. Still has cervix and ovaries; fibroids  . CHOLECYSTECTOMY    . EXPLORATION ABDOMINAL ARTERY    . foot surgery Left   . INCISION TENDON SHEATH FOR TRIGGER FINGER Right   . jaw surgery    . knee  surgery Left   . TONSILLECTOMY      Prior to Admission medications  Medication Sig Taking? Last Dose  albuterol  90 mcg/actuation inhaler Inhale 2 inhalations into the lungs every 6 (six) hours as needed for Wheezing. Yes PRN Not Currently Taking  estradiol  (DOTTI ) patch 0.05 mg/24 hr Place 1 patch onto the skin twice a week Yes   losartan -hydrochlorothiazide  (HYZAAR) 50-12.5 mg tablet Take 1 tablet by mouth once daily Yes Taking  meloxicam  (MOBIC ) 15 MG tablet Take 1 tablet (15 mg total) by mouth once daily Yes Taking  modafiniL  (PROVIGIL ) 100 MG tablet Take 100 mg by mouth every morning Yes Taking  montelukast  (SINGULAIR ) 10 mg tablet Take 1 tablet by mouth at bedtime Yes Taking  nadoloL  (CORGARD ) 20 MG tablet Take 20 mg by mouth once daily Yes Taking  nortriptyline  (PAMELOR ) 50 MG capsule TAKE 1 CAPSULE(50 MG) BY MOUTH AT BEDTIME Yes Taking  omeprazole  (PRILOSEC) 20 MG DR capsule  Yes Taking  pancrelipase  (CREON ) 36,000-114,000-180,000 unit DR capsule Take 1 capsule by mouth 3 (three) times daily with meals Yes Taking  rizatriptan  (MAXALT ) 10 MG tablet Take 10 mg at headache onset May take a second dose after 2 hours if needed.  No more than 2 doses in 24 hours Yes PRN Not Currently Taking  venlafaxine  (EFFEXOR ) 75 MG tablet Take 75 mg by mouth 2 (two) times daily Yes Taking  QUEtiapine (SEROQUEL) 25 MG tablet  Take 3 tablets (75 mg total) by mouth at bedtime Patient not taking: Reported on 10/11/2024  Not Taking    Allergies  Allergen Reactions  . Hydrocodone-Acetaminophen  Hives    hives   . Latex Hives, Swelling, Rash and Anaphylaxis  . Lorcaserin  Hcl Palpitations    Dry mouth, palpitation Dry mouth, palpitation   . Tomato Hives    Cherry tomatoes Cherry tomatoes   . Sumatriptan  Palpitations    Intolerance  Intolerance      Gynecologic History: No LMP recorded (lmp unknown). Patient has had a hysterectomy. Contraception: status post hysterectomy Pap Hx:  denies   Obstetric History: G2P2002  Social History   Socioeconomic History  . Marital status: Married  . Number of children: 2  Occupational History  . Occupation: [employer]  . Smoking status: Never  . Smokeless tobacco: Never  Vaping Use  . Vaping status: Never Used  Substance and Sexual Activity  . Alcohol use: No  . Drug use: No  . Sexual activity: Not Currently    Partners: Male    Birth control/protection: Surgical    Comment: hysterectomy   Social Drivers of Health   Financial Resource Strain: Medium Risk (12/21/2021)   Received from [facility]   Overall Financial Resource Strain (CARDIA)   . Difficulty of Paying Living Expenses: Somewhat hard  Food Insecurity: No Food Insecurity (12/21/2021)   Received from [facility]   Hunger Vital Sign   . Within the past 12 months, you worried that your food would run out before you got the money to buy more.: Never true   . Within the past 12 months, the food you bought just didn't last and you didn't have money to get more.: Never true  Transportation Needs: No Transportation Needs (12/21/2021)   Received from [facility] - Transportation   . Lack of Transportation (Medical): No   . Lack of Transportation (Non-Medical): No  Physical Activity: Inactive (12/21/2021)   Received from [facility]   Exercise Vital Sign   . On average, how many days per week do you engage in moderate to strenuous exercise (like a brisk walk)?: 0 days   . On average, how many minutes do you engage in exercise at this level?: 0 min  Stress: Stress Concern Present (12/21/2021)   Received from [facility] - Occupational Stress Questionnaire   . Feeling of Stress : Very much  Housing Stability: Unknown (10/03/2024)   Housing Stability Vital Sign   . Homeless in the Last Year: No    Family History  Problem Relation Name Age of Onset  . Breast cancer Mother    . Breast cancer Sister     . Breast cancer Maternal Aunt      ROS: see HPI for pertinent positives and negatives, otherwise a 10 system review is negative.  Specifically, she denies problems with period, abdominal pain, pelvic pain, vaginal discharge. Denies bowel problems, bladder problems, incontinence, depression or breast lumps or masses.  Physical Exam Vitals: BP (!) 159/89   Pulse 74   Ht 157.5 cm (5' 2)   Wt 88.5 kg (195 lb 3.2 oz)   LMP  (LMP Unknown)   BMI 35.70 kg/m    Chaperone present for pelvic exam. Examination chaperoned by A. Tod, CMA.  Physical Exam Genitourinary:     Vulva and rectum normal.     Pelvic Tanner Score: 5/5.    Vaginal cuff  intact.    No vaginal discharge.      Right Adnexa: not tender.    Left Adnexa: not tender.    Cervix is absent.     Uterus is absent.  Breasts:    Tanner Score is 5.     Right: Normal. No mass, nipple discharge, skin change or tenderness.     Left: Normal. No mass, nipple discharge, skin change or tenderness.  HENT:     Head: Normocephalic.     Nose: Nose normal.  Eyes:     Pupils: Pupils are equal, round, and reactive to light.  Cardiovascular:     Rate and Rhythm: Normal rate and regular rhythm.     Heart sounds: Normal heart sounds.  Pulmonary:     Effort: Pulmonary effort is normal.     Breath sounds: Normal breath sounds.  Abdominal:     General: Bowel sounds are normal.     Palpations: Abdomen is soft.     Tenderness: There is no abdominal tenderness.  Musculoskeletal:        General: Normal range of motion.     Cervical back: Normal range of motion.  Neurological:     Mental Status: She is alert and oriented to person, place, and time.  Skin:    General: Skin is warm and dry.     Capillary Refill: Capillary refill takes less than 2 seconds.  Psychiatric:        Mood and Affect: Mood normal.        Behavior: Behavior normal.        Thought Content: Thought content normal.        Judgment: Judgment normal.  Exam  conducted with a chaperone present.      Results: PHQ-9: 5   Assessment and Plan:  58 y.o. G67P2002 female here for routine annual gynecologic examination  Plan: Problem List Items Addressed This Visit   None Visit Diagnoses       Encounter for gynecological examination    -  Primary     Encounter for screening mammogram for malignant neoplasm of breast       Relevant Orders   Mammo screening digital bilateral     Routine screening for STI (sexually transmitted infection)       Relevant Orders   Xpert CT/NG, PCR - [facility]     Hot flash, menopausal       Relevant Medications   estradiol  (DOTTI ) patch 0.05 mg/24 hr (Start on 10/14/2024)       Screening: -- Blood pressure screen managed by PCP -- Colon Cancer Screening - not due -- Mammogram - due. Patient to call [place] to arrange. She understands that it is her responsibility to arrange this. -- Weight screening: obese: discussed management options, including lifestyle, dietary, and exercise. -- Depression screening negative (PHQ-9)  -- Nutrition: normal -- cholesterol screening: per PCP -- osteoporosis screening: not due -- tobacco screening: not using -- alcohol screening: AUDIT questionnaire indicates low-risk usage. -- family history of breast cancer screening: done. not at high risk. -- no evidence of domestic violence or intimate partner violence. -- STD screening: gonorrhea/chlamydia NAAT not collected per patient request. -- pap smear not collected per ASCCP guidelines -- flu vaccine: Up to date.  -- HPV vaccination series: not eligilbe  I emphasized the importance of an annual pelvic and breast exam, though as always these exams are her choice.  I have discussed the importance of breast self-awareness, weight bearing exercise and healthy diet  as well as adequate intake of dietary calcium from (1000mg ) and vitamin D  (400IU).    Refill sent to her pharmacy on [address].   Return in about 1 year (around  10/11/2025) for well woman.   Attestation Statement:   I personally performed the service, non-incident to. (WP)   KATHERINE PHILLIPS, NP [facility] 10/11/2024 3:42 PM

## 2024-10-13 DIAGNOSIS — M899 Disorder of bone, unspecified: Secondary | ICD-10-CM | POA: Insufficient documentation

## 2024-10-13 DIAGNOSIS — Z79899 Other long term (current) drug therapy: Secondary | ICD-10-CM | POA: Insufficient documentation

## 2024-10-13 DIAGNOSIS — G894 Chronic pain syndrome: Secondary | ICD-10-CM | POA: Insufficient documentation

## 2024-10-13 DIAGNOSIS — Z789 Other specified health status: Secondary | ICD-10-CM | POA: Insufficient documentation

## 2024-10-13 NOTE — Progress Notes (Unsigned)
 PROVIDER NOTE: Interpretation of information contained herein should be left to medically-trained personnel. Specific patient instructions are provided elsewhere under Patient Instructions section of medical record. This document was created in part using AI and STT-dictation technology, any transcriptional errors that may result from this process are unintentional.  Patient: Tammy Swanson  Service: E/M Encounter  Provider: Eric DELENA Como, MD  DOB: 1966-04-11  Delivery: Face-to-face  Specialty: Interventional Pain Management  MRN: [id number]  Setting: Ambulatory outpatient facility  Specialty designation: 09  Type: New Patient  Location: Outpatient office facility  PCP: Sowles, Krichna, MD  DOS: 10/14/2024    Referring Prov.: Sowles, Krichna, MD   Primary Reason(s) for Visit: Encounter for initial evaluation of one or more chronic problems (new to examiner) potentially causing chronic pain, and posing a threat to normal musculoskeletal function. (Level of risk: High) CC: No chief complaint on file.  HPI  Tammy Swanson is a 58 y.o. year old, female patient, who comes for the first time to our practice referred by Sowles, Krichna, MD for our initial evaluation of her chronic pain. She has Essential hypertension; Bad memory; Menopause; Obesity (BMI 30-39.9); Nodule, subcutaneous; Lesion of ulnar nerve; Vitiligo; Eczema; History of liver failure; Prediabetes; History of carpal tunnel surgery of right wrist; Headache disorder; Sleep disorder; Polyp of transverse colon; Chronic superficial gastritis without bleeding; Migraine with aura and without status migrainosus, not intractable; Moderate episode of recurrent major depressive disorder (HCC); Post-ERCP acute pancreatitis; Abnormal finding on diagnostic imaging of right kidney; Vitamin D  deficiency; B12 deficiency; Idiopathic chronic pancreatitis (HCC); Small intestinal bacterial overgrowth (SIBO); Chronic pain syndrome; Pharmacologic therapy;  Disorder of skeletal system; and Problems influencing health status on their problem list. Today she comes in for evaluation of her No chief complaint on file.  Pain Assessment: Location:     Radiating:   Onset:   Duration:   Quality:   Severity:  /10 (subjective, self-reported pain score)  Effect on ADL:   Timing:   Modifying factors:   BP:    HR:    Onset and Duration: {Hx; Onset and Duration:210120511} Cause of pain: {Hx; Cause:210120521} Severity: {Pain Severity:210120502} Timing: {Symptoms; Timing:210120501} Aggravating Factors: {Causes; Aggravating pain factors:210120507} Alleviating Factors: {Causes; Alleviating Factors:210120500} Associated Problems: {Hx; Associated problems:210120515} Quality of Pain: {Hx; Symptom quality or Descriptor:210120531} Previous Examinations or Tests: {Hx; Previous examinations or test:210120529} Previous Treatments: {Hx; Previous Treatment:210120503}  Tammy Swanson is being evaluated for possible interventional pain management therapies for the treatment of her chronic pain.  Discussed the use of AI scribe software for clinical note transcription with the patient, who gave verbal consent to proceed.  History of Present Illness            Tammy Swanson has been informed that this initial visit was an evaluation only.  On the follow up appointment I will go over the results, including ordered tests and available interventional therapies. At that time she will have the opportunity to decide whether to proceed with offered therapies or not. In the event that Tammy Swanson prefers avoiding interventional options, this will conclude our involvement in the case.  Medication management recommendations may be provided upon request.  Patient informed that diagnostic tests may be ordered to assist in identifying underlying causes, narrow the list of differential diagnoses and aid in determining candidacy for (or contraindications to) planned  therapeutic interventions.  Historic Controlled Substance Pharmacotherapy Review PMP and historical list of controlled substances: ***  Most recently prescribed controlled substance(s): Opioid Analgesic: *** MME/day: ***  mg/day  Historical Monitoring: The patient  reports no history of drug use. List of prior UDS Testing: No results found for: MDMA, COCAINSCRNUR, PCPSCRNUR, PCPQUANT, CANNABQUANT, THCU, ETH, CBDTHCR, D8THCCBX, D9THCCBX Historical Background Evaluation: [place] PMP: PDMP reviewed during this encounter. Review of the past 76-months conducted.             PMP NARX Score Report:  Narcotic: 030 Sedative: 030 Stimulant: 221  Department of public safety, offender search: Engineer, [employer]) Non-contributory Risk Assessment Profile: Aberrant behavior: None observed or detected today Risk factors for fatal opioid overdose: None identified today PMP NARX Overdose Risk Score: 250 Fatal overdose hazard ratio (HR): Calculation deferred Non-fatal overdose hazard ratio (HR): Calculation deferred Risk of opioid abuse or dependence: 0.7-3.0% with doses <= 36 MME/day and 6.1-26% with doses >= 120 MME/day. Substance use disorder (SUD) risk level: See below Personal History of Substance Abuse (SUD-Substance use disorder):  Alcohol:    Illegal Drugs:    Rx Drugs:    ORT Risk Level calculation:    ORT Scoring interpretation table:  Score <3 = Low Risk for SUD  Score between 4-7 = Moderate Risk for SUD  Score >8 = High Risk for Opioid Abuse   PHQ-2 Depression Scale:  Total score:    PHQ-2 Scoring interpretation table: (Score and probability of major depressive disorder)  Score 0 = No depression  Score 1 = 15.4% Probability  Score 2 = 21.1% Probability  Score 3 = 38.4% Probability  Score 4 = 45.5% Probability  Score 5 = 56.4% Probability  Score 6 = 78.6% Probability   PHQ-9 Depression Scale:  Total score:    PHQ-9 Scoring interpretation table:  Score 0-4 =  No depression  Score 5-9 = Mild depression  Score 10-14 = Moderate depression  Score 15-19 = Moderately severe depression  Score 20-27 = Severe depression (2.4 times higher risk of SUD and 2.89 times higher risk of overuse)   Pharmacologic Plan: As per protocol, I have not taken over any controlled substance management, pending the results of ordered tests and/or consults.            Initial impression: Pending review of available data and ordered tests.  Meds   Current Outpatient Medications:    Albuterol -Budesonide  (AIRSUPRA ) 90-80 MCG/ACT AERO, Inhale 2 puffs into the lungs 4 (four) times daily as needed., Disp: 32.1 g, Rfl: 0   cholecalciferol (VITAMIN D ) 1000 units tablet, Take 1,000 Units by mouth daily., Disp: , Rfl:    Cyanocobalamin  (B-12) 1000 MCG SUBL, Place 1 tablet under the tongue daily., Disp: , Rfl:    estradiol  (VIVELLE -DOT) 0.05 MG/24HR patch, APPLY 1 PATCH TWICE WEEKLY, Disp: 24 patch, Rfl: 0   lipase/protease/amylase (CREON ) 36000 UNITS CPEP capsule, Take 2 capsules (72,000 Units total) by mouth 3 (three) times daily with meals. May also take 1 capsule (36,000 Units total) as needed (with snacks)., Disp: 720 capsule, Rfl: 1   losartan -hydrochlorothiazide  (HYZAAR) 100-12.5 MG tablet, Take 1 tablet by mouth daily., Disp: 90 tablet, Rfl: 1   Misc Natural Products (IBEROGAST) CAPS, Take 2 each by mouth 4 (four) times daily -  before meals and at bedtime., Disp: , Rfl:    modafinil  (PROVIGIL ) 100 MG tablet, Take 1 tablet (100 mg total) by mouth every morning., Disp: 90 tablet, Rfl: 0   montelukast  (SINGULAIR ) 10 MG tablet, Take 1 tablet (10 mg total) by mouth at bedtime., Disp: 90 tablet, Rfl: 0   nadolol  (CORGARD ) 20 MG tablet, Take 1 tablet (20  mg total) by mouth daily. In place of Atenolol , Disp: 90 tablet, Rfl: 1   nortriptyline  (PAMELOR ) 50 MG capsule, Take 1 capsule (50 mg total) by mouth at bedtime., Disp: 90 capsule, Rfl: 0   omeprazole  (PRILOSEC) 20 MG capsule, TAKE 1  CAPSULE BY MOUTH EVERY DAY BEFORE BREAKFAST, Disp: 90 capsule, Rfl: 1   rizatriptan  (MAXALT ) 10 MG tablet, Take 1 tablet (10 mg total) by mouth as needed for migraine., Disp: 10 tablet, Rfl: 1   tretinoin (RETIN-A) 0.025 % cream, SMARTSIG:Sparingly Topical Every Other Day, Disp: , Rfl:    venlafaxine  (EFFEXOR ) 75 MG tablet, Take 3 tablets (225 mg total) by mouth daily at 12 noon., Disp: 270 tablet, Rfl: 1   Zavegepant HCl (ZAVZPRET ) 10 MG/ACT SOLN, Place 1 each into the nose daily as needed., Disp: 6 each, Rfl: 1  Imaging Review  Ankle Imaging: Ankle-R DG Complete: Results for orders placed during the hospital encounter of 03/03/18 DG Ankle Complete Right  Narrative CLINICAL DATA:  Right foot and ankle pain after fall down 8 stairs. Unable to bear weight. Swelling about dorsal foot/ankle.  EXAM: RIGHT ANKLE - COMPLETE 3+ VIEW  COMPARISON:  None.  FINDINGS: Small acute avulsion fracture from the dorsal talus anteriorly. Associated soft tissue edema. No additional acute fracture. The ankle mortise is preserved. Trace tibial talar spurring. There is a plantar calcaneal spur and Achilles tendon enthesophyte.  IMPRESSION: Small avulsion fracture from the anterior dorsal talus with associated soft tissue edema.   Electronically Signed By: Andrea Bernhardt M.D. On: 03/03/2018 21:37  Foot Imaging: Foot-R DG Complete: Results for orders placed during the hospital encounter of 03/03/18 DG Foot Complete Right  Narrative CLINICAL DATA:  Right foot and ankle pain after fall down 8 stairs. Unable to bear weight. Swelling about dorsal foot/ankle.  EXAM: RIGHT FOOT COMPLETE - 3+ VIEW  COMPARISON:  None.  FINDINGS: Small acute avulsion fracture from the dorsal anterior talus. No additional acute fracture of the foot. Minimal hammertoe deformity of the digits, alignment is otherwise normal. Prominent plantar calcaneal spur and Achilles tendon enthesophyte.  IMPRESSION: Acute  avulsion fracture from the dorsal anterior talus with associated soft tissue edema.   Electronically Signed By: Andrea Bernhardt M.D. On: 03/03/2018 21:39  Complexity Note: Imaging results reviewed.                         ROS  Cardiovascular: {Hx; Cardiovascular History:210120525} Pulmonary or Respiratory: {Hx; Pumonary and/or Respiratory History:210120523} Neurological: {Hx; Neurological:210120504} Psychological-Psychiatric: {Hx; Psychological-Psychiatric History:210120512} Gastrointestinal: {Hx; Gastrointestinal:210120527} Genitourinary: {Hx; Genitourinary:210120506} Hematological: {Hx; Hematological:210120510} Endocrine: {Hx; Endocrine history:210120509} Rheumatologic: {Hx; Rheumatological:210120530} Musculoskeletal: {Hx; Musculoskeletal:210120528} Work History: {Hx; Work history:210120514}  Allergies  Tammy Swanson is allergic to latex, belviq [lorcaserin  hcl], tomato, vicodin [hydrocodone-acetaminophen ], and imitrex  [sumatriptan ].  Laboratory Chemistry Profile   Renal Lab Results  Component Value Date   BUN 13 05/20/2024   CREATININE 0.73 05/20/2024   BCR SEE NOTE: 05/20/2024   GFRAA 87 09/04/2020   GFRNONAA 76 09/04/2020   SPECGRAV >1.030 (H) 09/29/2023   PHUR 5.5 09/29/2023   PROTEINUR 1+ (A) 09/29/2023     Electrolytes Lab Results  Component Value Date   NA 141 05/20/2024   K 4.3 05/20/2024   CL 104 05/20/2024   CALCIUM 9.9 05/20/2024     Hepatic Lab Results  Component Value Date   AST 15 05/20/2024   ALT 14 05/20/2024   ALBUMIN 4.1 12/08/2022   ALKPHOS 125 (H) 12/08/2022  AMYLASE 41 04/11/2023   LIPASE 33 05/20/2024     ID No results found for: LYMEIGGIGMAB, HIV, SARSCOV2NAA, STAPHAUREUS, MRSAPCR, HCVAB, PREGTESTUR, RMSFIGG, QFVRPH1IGG, QFVRPH2IGG   Bone Lab Results  Component Value Date   VD25OH 31 08/29/2023     Endocrine Lab Results  Component Value Date   GLUCOSE 79 05/20/2024   GLUCOSEU Negative 09/29/2023    HGBA1C 6.2 (H) 08/29/2023   TSH 1.640 12/22/2021     Neuropathy Lab Results  Component Value Date   VITAMINB12 860 08/29/2023   FOLATE 12.9 08/29/2023   HGBA1C 6.2 (H) 08/29/2023     CNS No results found for: COLORCSF, APPEARCSF, RBCCOUNTCSF, WBCCSF, POLYSCSF, LYMPHSCSF, EOSCSF, PROTEINCSF, GLUCCSF, JCVIRUS, CSFOLI, IGGCSF, LABACHR, ACETBL   Inflammation (CRP: Acute  ESR: Chronic) Lab Results  Component Value Date   CRP 12.3 (H) 05/20/2024   ESRSEDRATE 31 (H) 05/20/2024     Rheumatology Lab Results  Component Value Date   RF <10 05/20/2024   ANA NEGATIVE 05/30/2024     Coagulation Lab Results  Component Value Date   PLT 353 05/20/2024     Cardiovascular Lab Results  Component Value Date   TROPONINI <0.03 03/03/2018   HGB 14.3 05/20/2024   HCT 43.6 05/20/2024     Screening No results found for: SARSCOV2NAA, COVIDSOURCE, STAPHAUREUS, MRSAPCR, HCVAB, HIV, PREGTESTUR   Cancer No results found for: CEA, CA125, LABCA2   Allergens No results found for: ALMOND, APPLE, ASPARAGUS, AVOCADO, BANANA, BARLEY, BASIL, BAYLEAF, GREENBEAN, LIMABEAN, WHITEBEAN, BEEFIGE, REDBEET, BLUEBERRY, BROCCOLI, CABBAGE, MELON, CARROT, CASEIN, CASHEWNUT, CAULIFLOWER, CELERY     Note: Lab results reviewed.  PFSH  Drug: Ms. Dacosta  reports no history of drug use. Alcohol:  reports current alcohol use. Tobacco:  reports that she has never smoked. She has never used smokeless tobacco. Medical:  has a past medical history of Anxiety, Arthritis, Asthma, Complication of anesthesia, Headache, Hypertension, Liver failure (HCC) (1994), Obesity, Pain in joint involving upper arm, Pancreatitis, Paresthesia of thumb of right hand, PONV (postoperative nausea and vomiting), Vertigo, Vitamin D  deficiency, Vitiligo, and Wears dentures. Family: family history includes Bipolar disorder in her daughter; Breast  cancer in her maternal aunt, maternal aunt, and mother; Cancer in her father, maternal aunt, mother, paternal aunt, and paternal uncle; HIV/AIDS in her mother; Hypertension in her maternal grandfather, maternal grandmother, mother, and sister.  Past Surgical History:  Procedure Laterality Date   ABDOMINAL EXPLORATION SURGERY     x3   ABDOMINAL HYSTERECTOMY  2013   ABDOMINAL HYSTERECTOMY  2014   CARPAL TUNNEL RELEASE Right 12/06/2016   Procedure: CARPAL TUNNEL RELEASE;  Surgeon: Ozell Flake, MD;  Location: [facility];  Service: Orthopedics;  Laterality: Right;   CHOLECYSTECTOMY     COLONOSCOPY WITH PROPOFOL  N/A 07/19/2021   Procedure: COLONOSCOPY WITH BIOPSY;  Surgeon: Jinny Carmine, MD;  Location: [facility];  Service: Endoscopy;  Laterality: N/A;   ESOPHAGOGASTRODUODENOSCOPY (EGD) WITH PROPOFOL  N/A 07/19/2021   Procedure: ESOPHAGOGASTRODUODENOSCOPY (EGD) WITH BIOPSY;  Surgeon: Jinny Carmine, MD;  Location: [facility];  Service: Endoscopy;  Laterality: N/A;  Latex   FOOT SURGERY     FOOT SURGERY Left 2007/2009   Dr. Lilli   KNEE ARTHROSCOPY Left 1985   KNEE SURGERY Left    MANDIBLE FRACTURE SURGERY     NASAL SINUS SURGERY     NASAL SINUS SURGERY  2012   POLYPECTOMY N/A 07/19/2021   Procedure: POLYPECTOMY;  Surgeon: Jinny Carmine, MD;  Location: [facility];  Service: Endoscopy;  Laterality:  N/A;   RECONSTRUCTION MANDIBLE / MAXILLA  1992   TONSILLECTOMY     TONSILLECTOMY  1981   TRIGGER FINGER RELEASE     Active Ambulatory Problems    Diagnosis Date Noted   Essential hypertension 09/03/2010   Bad memory 06/01/2015   Menopause 06/01/2015   Obesity (BMI 30-39.9) 06/01/2015   Nodule, subcutaneous 06/01/2015   Lesion of ulnar nerve 06/01/2015   Vitiligo 06/01/2015   Eczema 09/10/2015   History of liver failure 08/24/2016   Prediabetes 10/15/2016   History of carpal tunnel surgery of right wrist 12/06/2016   Headache disorder 10/22/2019   Sleep disorder  10/22/2019   Polyp of transverse colon    Chronic superficial gastritis without bleeding 08/23/2022   Migraine with aura and without status migrainosus, not intractable 08/23/2022   Moderate episode of recurrent major depressive disorder (HCC) 08/23/2022   Post-ERCP acute pancreatitis 10/21/2022   Abnormal finding on diagnostic imaging of right kidney 11/29/2022   Vitamin D  deficiency 11/29/2022   B12 deficiency 11/29/2022   Idiopathic chronic pancreatitis (HCC) 08/29/2023   Small intestinal bacterial overgrowth (SIBO) 09/03/2024   Chronic pain syndrome 10/13/2024   Pharmacologic therapy 10/13/2024   Disorder of skeletal system 10/13/2024   Problems influencing health status 10/13/2024   Resolved Ambulatory Problems    Diagnosis Date Noted   Cough variant asthma 06/01/2015   Migraine without aura and without status migrainosus, not intractable 09/10/2015   Asthma, mild intermittent 08/24/2016   Nausea 03/06/2020   Phonophobia 03/06/2020   Photophobia 03/06/2020   Transient confusion 05/25/2020   Screen for colon cancer    Dyspepsia    Acute gastritis without hemorrhage    History of pancreatitis 08/23/2022   Morbid obesity (HCC) 08/23/2022   Hypercalcemia 08/23/2022   Mild protein-calorie malnutrition 10/21/2022   Moderate persistent asthma without complication 11/29/2022   Mild persistent asthma without complication 08/29/2023   Past Medical History:  Diagnosis Date   Anxiety    Arthritis    Asthma    Complication of anesthesia    Headache    Hypertension    Liver failure (HCC) 1994   Obesity    Pain in joint involving upper arm    Pancreatitis    Paresthesia of thumb of right hand    PONV (postoperative nausea and vomiting)    Vertigo    Wears dentures    Constitutional Exam  General appearance: Well nourished, well developed, and well hydrated. In no apparent acute distress There were no vitals filed for this visit. BMI Assessment: Estimated body mass index  is 34.79 kg/m as calculated from the following:   Height as of 09/03/24: 5' 2 (1.575 m).   Weight as of 09/03/24: 190 lb 3.2 oz (86.3 kg).  BMI interpretation table: BMI level Category Range association with higher incidence of chronic pain  <18 kg/m2 Underweight   18.5-24.9 kg/m2 Ideal body weight   25-29.9 kg/m2 Overweight Increased incidence by 20%  30-34.9 kg/m2 Obese (Class I) Increased incidence by 68%  35-39.9 kg/m2 Severe obesity (Class II) Increased incidence by 136%  >40 kg/m2 Extreme obesity (Class III) Increased incidence by 254%   Patient's current BMI Ideal Body weight  There is no height or weight on file to calculate BMI. Patient weight not recorded   BMI Readings from Last 4 Encounters:  09/03/24 34.79 kg/m  05/20/24 34.04 kg/m  02/13/24 34.06 kg/m  11/15/23 33.60 kg/m   Wt Readings from Last 4 Encounters:  09/03/24 190 lb 3.2 oz (  86.3 kg)  05/20/24 186 lb 1.6 oz (84.4 kg)  02/13/24 186 lb 3.2 oz (84.5 kg)  11/15/23 183 lb 11.2 oz (83.3 kg)    Psych/Mental status: Alert, oriented x 3 (person, place, & time)       Eyes: PERLA Respiratory: No evidence of acute respiratory distress  Assessment  Primary Diagnosis & Pertinent Problem List: The primary encounter diagnosis was Chronic pain syndrome. Diagnoses of Pharmacologic therapy, Disorder of skeletal system, and Problems influencing health status were also pertinent to this visit.  Visit Diagnosis (New problems to examiner): 1. Chronic pain syndrome   2. Pharmacologic therapy   3. Disorder of skeletal system   4. Problems influencing health status    Plan of Care (Initial workup plan)  Note: Ms. Caba was reminded that as per protocol, today's visit has been an evaluation only. We have not taken over the patient's controlled substance management.  Problem-specific plan: Assessment and Plan            Lab Orders  No laboratory test(s) ordered today   Imaging Orders  No imaging studies  ordered today   Referral Orders  No referral(s) requested today   Procedure Orders    No procedure(s) ordered today   Pharmacotherapy (current): Medications ordered:  No orders of the defined types were placed in this encounter.  Medications administered during this visit: Malie A. Swanson had no medications administered during this visit.   Analgesic Pharmacotherapy:  Opioid Analgesics: For patients currently taking or requesting to take opioid analgesics, in accordance with [place]  Medical Board Guidelines, we will assess their risks and indications for the use of these substances. After completing our evaluation, we may offer recommendations, but we no longer take patients for medication management. The prescribing physician will ultimately decide, based on his/her training and level of comfort whether to adopt any of the recommendations, including whether or not to prescribe such medicines.  Membrane stabilizer: To be determined at a later time  Muscle relaxant: To be determined at a later time  NSAID: To be determined at a later time  Other analgesic(s): To be determined at a later time   Interventional management options: Ms. Trevizo was informed that there is no guarantee that she would be a candidate for interventional therapies. The decision will be based on the results of diagnostic studies, as well as Tammy Swanson's risk profile.  Procedure(s) under consideration:  Pending results of ordered studies     Interventional Therapies  Risk Factors  Considerations  Medical Comorbidities:  ALLERGY: LATEX, Hydrocodone  Hx. Gastritis  BA  Hx. D & B12 def.  Memory Impairment  HTN  Hx. Liver Failure  (BMI>30)     Planned  Pending:      Under consideration:   Pending   Completed: (Analgesic benefit)1  None at this time   Therapeutic  Palliative (PRN) options:   None established   Completed by other providers:   None reported   1(Analgesic benefit): Expressed in percentage (%). (Local anesthetic[LA] +/- sedation  L.A.Local Anesthetic  Steroid benefit  Ongoing benefit)   Provider-requested follow-up: No follow-ups on file.  Future Appointments  Date Time Provider Department Center  10/14/2024  9:00 AM Tanya Glisson, MD [facility] None  12/03/2024  2:40 PM Sowles, Krichna, MD [facility]   I discussed the assessment and treatment plan with the patient. The patient was provided an opportunity to ask questions and all were answered. The patient agreed with the plan and demonstrated an  understanding of the instructions.  Patient advised to call back or seek an in-person evaluation if the symptoms or condition worsens.  Duration of encounter: *** minutes.  Total time on encounter, as per AMA guidelines included both the face-to-face and non-face-to-face time personally spent by the physician and/or other qualified health care professional(s) on the day of the encounter (includes time in activities that require the physician or other qualified health care professional and does not include time in activities normally performed by clinical staff). Physician's time may include the following activities when performed: Preparing to see the patient (e.g., pre-charting review of records, searching for previously ordered imaging, lab work, and nerve conduction tests) Review of prior analgesic pharmacotherapies. Reviewing PMP Interpreting ordered tests (e.g., lab work, imaging, nerve conduction tests) Performing post-procedure evaluations, including interpretation of diagnostic procedures Obtaining and/or reviewing separately obtained history Performing a medically appropriate examination and/or evaluation Counseling and educating the patient/family/caregiver Ordering medications, tests, or procedures Referring and communicating with other health care professionals (when not separately reported) Documenting  clinical information in the electronic or other health record Independently interpreting results (not separately reported) and communicating results to the patient/ family/caregiver Care coordination (not separately reported)  Note by: Tammy DELENA Como, MD (TTS and AI technology used. I apologize for any typographical errors that were not detected and corrected.) Date: 10/14/2024; Time: 8:34 AM

## 2024-10-13 NOTE — Patient Instructions (Signed)

## 2024-10-14 ENCOUNTER — Encounter: Payer: Self-pay | Admitting: Pain Medicine

## 2024-10-14 ENCOUNTER — Ambulatory Visit
Admission: RE | Admit: 2024-10-14 | Discharge: 2024-10-14 | Disposition: A | Discharge status: Home / Self Care | Source: Ambulatory Visit | Attending: Pain Medicine | Admitting: Pain Medicine

## 2024-10-14 ENCOUNTER — Ambulatory Visit: Admitting: Pain Medicine

## 2024-10-14 VITALS — BP 177/82 | HR 78 | Temp 97.2°F | Resp 18 | Ht 62.0 in | Wt 195.0 lb

## 2024-10-14 DIAGNOSIS — J9811 Atelectasis: Secondary | ICD-10-CM | POA: Diagnosis not present

## 2024-10-14 DIAGNOSIS — G894 Chronic pain syndrome: Secondary | ICD-10-CM | POA: Insufficient documentation

## 2024-10-14 DIAGNOSIS — Z789 Other specified health status: Secondary | ICD-10-CM

## 2024-10-14 DIAGNOSIS — M5414 Radiculopathy, thoracic region: Secondary | ICD-10-CM | POA: Insufficient documentation

## 2024-10-14 DIAGNOSIS — M899 Disorder of bone, unspecified: Secondary | ICD-10-CM

## 2024-10-14 DIAGNOSIS — G8929 Other chronic pain: Secondary | ICD-10-CM | POA: Diagnosis not present

## 2024-10-14 DIAGNOSIS — K861 Other chronic pancreatitis: Secondary | ICD-10-CM

## 2024-10-14 DIAGNOSIS — Z79899 Other long term (current) drug therapy: Secondary | ICD-10-CM

## 2024-10-14 DIAGNOSIS — M419 Scoliosis, unspecified: Secondary | ICD-10-CM | POA: Diagnosis not present

## 2024-10-14 DIAGNOSIS — M546 Pain in thoracic spine: Secondary | ICD-10-CM | POA: Diagnosis not present

## 2024-10-14 DIAGNOSIS — M545 Low back pain, unspecified: Secondary | ICD-10-CM | POA: Insufficient documentation

## 2024-10-14 DIAGNOSIS — M5136 Other intervertebral disc degeneration, lumbar region with discogenic back pain only: Secondary | ICD-10-CM | POA: Diagnosis not present

## 2024-10-14 DIAGNOSIS — M5137 Other intervertebral disc degeneration, lumbosacral region with discogenic back pain only: Secondary | ICD-10-CM | POA: Diagnosis not present

## 2024-10-14 DIAGNOSIS — M5134 Other intervertebral disc degeneration, thoracic region: Secondary | ICD-10-CM | POA: Diagnosis not present

## 2024-10-14 DIAGNOSIS — M47816 Spondylosis without myelopathy or radiculopathy, lumbar region: Secondary | ICD-10-CM | POA: Diagnosis not present

## 2024-10-15 ENCOUNTER — Ambulatory Visit: Admitting: Family Medicine

## 2024-10-16 LAB — COMPLIANCE DRUG ANALYSIS, UR

## 2024-10-22 ENCOUNTER — Other Ambulatory Visit: Payer: Self-pay | Admitting: Family Medicine

## 2024-10-22 DIAGNOSIS — K2930 Chronic superficial gastritis without bleeding: Secondary | ICD-10-CM

## 2024-10-22 LAB — COMP. METABOLIC PANEL (12)
AST: 21 IU/L (ref 0–40)
Albumin: 4.3 g/dL (ref 3.8–4.9)
Alkaline Phosphatase: 131 IU/L (ref 49–135)
BUN/Creatinine Ratio: 17 (ref 12–28)
BUN: 14 mg/dL (ref 8–27)
Bilirubin Total: 0.3 mg/dL (ref 0.0–1.2)
Calcium: 10.3 mg/dL (ref 8.7–10.3)
Chloride: 101 mmol/L (ref 96–106)
Creatinine, Ser: 0.82 mg/dL (ref 0.57–1.00)
Globulin, Total: 3.5 g/dL (ref 1.5–4.5)
Glucose: 92 mg/dL (ref 70–99)
Potassium: 4.4 mmol/L (ref 3.5–5.2)
Sodium: 141 mmol/L (ref 134–144)
Total Protein: 7.8 g/dL (ref 6.0–8.5)
eGFR: 82 mL/min/1.73 (ref >59)

## 2024-10-22 LAB — 25-HYDROXY VITAMIN D LCMS D2+D3
25-Hydroxy, Vitamin D-2: <1 ng/mL
25-Hydroxy, Vitamin D-3: 42 ng/mL
25-Hydroxy, Vitamin D: 42 ng/mL

## 2024-10-22 LAB — SEDIMENTATION RATE: Sed Rate: 59 mm/h — ABNORMAL HIGH (ref 0–40)

## 2024-10-22 LAB — VITAMIN B12: Vitamin B-12: 945 pg/mL (ref 232–1245)

## 2024-10-22 LAB — MAGNESIUM: Magnesium: 1.9 mg/dL (ref 1.6–2.3)

## 2024-10-22 LAB — C-REACTIVE PROTEIN: CRP: 18 mg/L — ABNORMAL HIGH (ref 0–10)

## 2024-10-24 NOTE — Telephone Encounter (Signed)
 Requested Prescriptions  Pending Prescriptions Disp Refills   omeprazole  (PRILOSEC) 20 MG capsule [Pharmacy Med Name: OMEPRAZOLE  20MG  CAPSULES] 90 capsule 1    Sig: TAKE 1 CAPSULE BY MOUTH EVERY DAY BEFORE BREAKFAST     Gastroenterology: Proton Pump Inhibitors Passed - 10/24/2024  2:59 PM      Passed - Valid encounter within last 12 months    Recent Outpatient Visits           1 month ago Idiopathic chronic pancreatitis [facility])   [place] [facility] Glenard Mire, MD   5 months ago Idiopathic chronic pancreatitis [facility])   [place] [facility] Glenard Mire, MD   8 months ago Idiopathic chronic pancreatitis [facility])   [facility] Sowles, Krichna, MD

## 2024-11-09 ENCOUNTER — Other Ambulatory Visit: Payer: Self-pay | Admitting: Family Medicine

## 2024-11-09 DIAGNOSIS — J4530 Mild persistent asthma, uncomplicated: Secondary | ICD-10-CM

## 2024-11-10 DIAGNOSIS — R7982 Elevated C-reactive protein (CRP): Secondary | ICD-10-CM | POA: Insufficient documentation

## 2024-11-10 DIAGNOSIS — R700 Elevated erythrocyte sedimentation rate: Secondary | ICD-10-CM | POA: Insufficient documentation

## 2024-11-10 DIAGNOSIS — R10A1 Flank pain, right side: Secondary | ICD-10-CM | POA: Insufficient documentation

## 2024-11-10 DIAGNOSIS — G8929 Other chronic pain: Secondary | ICD-10-CM | POA: Insufficient documentation

## 2024-11-10 DIAGNOSIS — M47816 Spondylosis without myelopathy or radiculopathy, lumbar region: Secondary | ICD-10-CM | POA: Insufficient documentation

## 2024-11-10 NOTE — Progress Notes (Unsigned)
 PROVIDER NOTE: Interpretation of information contained herein should be left to medically-trained personnel. Specific patient instructions are provided elsewhere under Patient Instructions section of medical record. This document was created in part using AI and STT-dictation technology, any transcriptional errors that may result from this process are unintentional.  Patient: Tammy Swanson  Service: E/M   PCP: Sowles, Krichna, MD  DOB: 1966-06-06  DOS: 11/11/2024  Provider: Eric DELENA Como, MD  MRN: [id number]  Delivery: Face-to-face  Specialty: Interventional Pain Management  Type: Established Patient  Setting: Ambulatory outpatient facility  Specialty designation: 09  Referring Prov.: Tammy Mire, MD  Location: Outpatient office facility       Primary Reason(s) for Visit: Encounter for evaluation before starting new chronic pain management plan of care (Level of risk: moderate) CC: No chief complaint on file.  HPI  [name] is a 58 y.o. year old, female patient, who comes today for a follow-up evaluation to review the test results and decide on a treatment plan. She has Essential hypertension; Bad memory; Menopause; Obesity (BMI 30-39.9); Nodule, subcutaneous; Lesion of ulnar nerve; Vitiligo; Eczema; History of liver failure; Prediabetes; History of carpal tunnel surgery of right wrist; Headache disorder; Sleep disorder; Polyp of transverse colon; Chronic superficial gastritis without bleeding; Migraine with aura and without status migrainosus, not intractable; Moderate episode of recurrent major depressive disorder (HCC); Post-ERCP acute pancreatitis; Abnormal finding on diagnostic imaging of right kidney; Vitamin D  deficiency; B12 deficiency; Idiopathic chronic pancreatitis (HCC); Small intestinal bacterial overgrowth (SIBO); Chronic pain syndrome; Pharmacologic therapy; Disorder of skeletal system; Problems influencing health status; Chronic low back pain (Right) w/o  sciatica; Chronic thoracic back pain (Right); Thoracic radiculitis (Right); History of collapse of lung (Right); Lumbar facet arthropathy; Elevated sed rate; Elevated C-reactive protein (CRP); Chronic abdominal pain; Abdominal pain, chronic, right lower quadrant; and Abdominal wall pain in right flank on their problem list. Her primarily concern today is the No chief complaint on file.  Pain Assessment: Location:     Radiating:   Onset:   Duration:   Quality:   Severity:  /10 (subjective, self-reported pain score)  Effect on ADL:   Timing:   Modifying factors:   BP:    HR:    Tammy Swanson comes in today for a follow-up visit after her initial evaluation on 10/14/2024. Today we went over the results of her tests. These were explained in Layman's terms. During today's appointment we went over my diagnostic impression, as well as the proposed treatment plan.  Review of initial evaluation (10/14/2024): Tammy Swanson is a 58 year old female with a history of pancreatitis who presents with right-sided abdominal and back pain.   She experiences persistent right-sided abdominal pain described as a 'kick' that occurs multiple times a day, taking her breath away. The pain is dull and nagging, radiating to her right flank and back, extending to the iliac crest. There is no pain, numbness, or weakness in the right leg.   She has a history of pancreatitis with severe episodes requiring hospitalization, including complications such as liver failure and a collapsed lung. Her gallbladder was removed due to gallstones, and she had stones in the bile duct and sludge on the liver affecting her liver enzymes in 2023.   She has not had any x-rays or MRIs of her spine but had imaging of her pancreas, liver, and kidneys in March 2025. She does not take over-the-counter pain medications due to her blood pressure medication but recently started meloxicam  15 mg  at night for hand pain, which has been  effective.   Her sleep is affected by the pain, which worsens when she sleeps on her right side. She has a history of high blood pressure but denies diabetes or cancer. She does not consume alcohol regularly, and her pancreatitis is likely related to gallstones rather than alcohol use.   Review of diagnostic test ordered on 10/14/2024:  Diagnostic lab work: *** Diagnostic imaging: ***  Discussed the use of AI scribe software for clinical note transcription with the patient, who gave verbal consent to proceed.  History of Present Illness          Patient presented with interventional treatment options. Tammy Swanson was informed that I will not be providing medication management. Pharmacotherapy evaluation including recommendations may be offered, if specifically requested.   Controlled Substance Pharmacotherapy Assessment REMS (Risk Evaluation and Mitigation Strategy)  Opioid Analgesic: None MME/day: 0 mg/day   Pill Count: None expected due to no prior prescriptions written by our practice. No notes on file  Pharmacokinetics: Liberation and absorption (onset of action): WNL Distribution (time to peak effect): WNL Metabolism and excretion (duration of action): WNL         Pharmacodynamics: Desired effects: Analgesia: Tammy Swanson reports >50% benefit. Functional ability: Patient reports that medication allows her to accomplish basic ADLs Clinically meaningful improvement in function (CMIF): Sustained CMIF goals met Perceived effectiveness: Described as relatively effective, allowing for increase in activities of daily living (ADL) Undesirable effects: Side-effects or Adverse reactions: None reported Monitoring: [place] PMP: PDMP reviewed during this encounter. Online review of the past 9-month period previously conducted. Not applicable at this point since we have not taken over the patient's medication management yet. List of other Serum/Urine Drug Screening Test(s):  No  results found for: AMPHSCRSER, BARBSCRSER, BENZOSCRSER, COCAINSCRSER, COCAINSCRNUR, PCPSCRSER, THCSCRSER, THCU, CANNABQUANT, OPIATESCRSER, OXYSCRSER, PROPOXSCRSER, ETH, CBDTHCR, D8THCCBX, D9THCCBX List of all UDS test(s) done:  Lab Results  Component Value Date   SUMMARY FINAL 10/14/2024   Last UDS on record: Summary  Date Value Ref Range Status  10/14/2024 FINAL  Final    Comment:    ==================================================================== Compliance Drug Analysis, Ur ==================================================================== Test                             Result       Flag       Units  Drug Present and Declared for Prescription Verification   Nortriptyline                   PRESENT      EXPECTED    Nortriptyline  may be administered as a prescription drug; it is also    an expected metabolite of amitriptyline .    Venlafaxine                     PRESENT      EXPECTED   Desmethylvenlafaxine           PRESENT      EXPECTED    Desmethylvenlafaxine is an expected metabolite of venlafaxine .  ==================================================================== Test                      Result    Flag   Units      Ref Range   Creatinine              186  mg/dL      >=79 ==================================================================== Declared Medications:  The flagging and interpretation on this report are based on the  following declared medications.  Unexpected results may arise from  inaccuracies in the declared medications.   **Note: The testing scope of this panel includes these medications:   Nortriptyline  (Pamelor )  Venlafaxine  (Effexor )   **Note: The testing scope of this panel does not include the  following reported medications:   Albuterol  (Airsupra )  Budesonide  (Airsupra )  Estradiol   Hydrochlorothiazide  (Hyzaar)  Losartan  (Hyzaar)  Montelukast  (Singulair )  Nadolol  (Corgard )  Omeprazole   (Prilosec)  Pancrelipase  (Creon )  Rizatriptan  (Maxalt )  Tretinoin (Retin-A)  Vitamin B12  Vitamin D   Zavegepant (Zavzpret ) ==================================================================== For clinical consultation, please call [phone]. ====================================================================    UDS interpretation: No unexpected findings.          Medication Assessment Form: Not applicable. No opioids. Treatment compliance: Not applicable Risk Assessment Profile: Aberrant behavior: See initial evaluations. None observed or detected today Comorbid factors increasing risk of overdose: See initial evaluation. No additional risks detected today Opioid risk tool (ORT):     10/14/2024    9:05 AM  Opioid Risk   Alcohol 0  Illegal Drugs 0  Rx Drugs 0  Alcohol 0  Illegal Drugs 0  Rx Drugs 0  Age between 16-45 years  0  History of Preadolescent Sexual Abuse 0  Psychological Disease 0  Depression 0  Opioid Risk Tool Scoring 0  Opioid Risk Interpretation Low Risk    ORT Scoring interpretation table:  Score <3 = Low Risk for SUD  Score between 4-7 = Moderate Risk for SUD  Score >8 = High Risk for Opioid Abuse   Risk of substance use disorder (SUD): Low  Risk Mitigation Strategies:  Patient opioid safety counseling: No controlled substances prescribed. Patient-Prescriber Agreement (PPA): No agreement signed.  Controlled substance notification to other providers: None required. No opioid therapy.  Pharmacologic Plan: Non-opioid analgesic therapy offered. Interventional alternatives discussed.             Laboratory Chemistry Profile   Renal Lab Results  Component Value Date   BUN 14 10/14/2024   CREATININE 0.82 10/14/2024   BCR 17 10/14/2024   GFRAA 87 09/04/2020   GFRNONAA 76 09/04/2020   SPECGRAV >1.030 (H) 09/29/2023   PHUR 5.5 09/29/2023   PROTEINUR 1+ (A) 09/29/2023     Electrolytes Lab Results  Component Value Date   NA 141 10/14/2024    K 4.4 10/14/2024   CL 101 10/14/2024   CALCIUM 10.3 10/14/2024   MG 1.9 10/14/2024     Hepatic Lab Results  Component Value Date   AST 21 10/14/2024   ALT 14 05/20/2024   ALBUMIN 4.3 10/14/2024   ALKPHOS 131 10/14/2024   AMYLASE 41 04/11/2023   LIPASE 33 05/20/2024     ID No results found for: LYMEIGGIGMAB, HIV, SARSCOV2NAA, STAPHAUREUS, MRSAPCR, HCVAB, PREGTESTUR, RMSFIGG, QFVRPH1IGG, QFVRPH2IGG   Bone Lab Results  Component Value Date   VD25OH 31 08/29/2023   25OHVITD1 42 10/14/2024   25OHVITD2 <1.0 10/14/2024   25OHVITD3 42 10/14/2024     Endocrine Lab Results  Component Value Date   GLUCOSE 92 10/14/2024   GLUCOSEU Negative 09/29/2023   HGBA1C 6.2 (H) 08/29/2023   TSH 1.640 12/22/2021     Neuropathy Lab Results  Component Value Date   VITAMINB12 945 10/14/2024   FOLATE 12.9 08/29/2023   HGBA1C 6.2 (H) 08/29/2023     CNS No results found for: COLORCSF, APPEARCSF, RBCCOUNTCSF,  WBCCSF, POLYSCSF, LYMPHSCSF, EOSCSF, PROTEINCSF, GLUCCSF, JCVIRUS, CSFOLI, IGGCSF, LABACHR, ACETBL   Inflammation (CRP: Acute  ESR: Chronic) Lab Results  Component Value Date   CRP 18 (H) 10/14/2024   ESRSEDRATE 59 (H) 10/14/2024     Rheumatology Lab Results  Component Value Date   RF <10 05/20/2024   ANA NEGATIVE 05/30/2024     Coagulation Lab Results  Component Value Date   PLT 353 05/20/2024     Cardiovascular Lab Results  Component Value Date   TROPONINI <0.03 03/03/2018   HGB 14.3 05/20/2024   HCT 43.6 05/20/2024     Screening No results found for: SARSCOV2NAA, COVIDSOURCE, STAPHAUREUS, MRSAPCR, HCVAB, HIV, PREGTESTUR   Cancer No results found for: CEA, CA125, LABCA2   Allergens No results found for: ALMOND, APPLE, ASPARAGUS, AVOCADO, BANANA, BARLEY, BASIL, BAYLEAF, GREENBEAN, LIMABEAN, WHITEBEAN, BEEFIGE, REDBEET, BLUEBERRY, BROCCOLI, CABBAGE, MELON,  CARROT, CASEIN, CASHEWNUT, CAULIFLOWER, CELERY     Note: Lab results reviewed.  Recent Diagnostic Imaging Review  Thoracic Imaging: Thoracic DG w/swimmers view: Results for orders placed during the hospital encounter of 10/14/24 DG Thoracic Spine W/Swimmers  Narrative CLINICAL DATA:  Upper/thoracic back pain  EXAM: THORACIC SPINE - 3 VIEWS  COMPARISON:  None  FINDINGS: Twelve rib-bearing thoracic type vertebral bodies. Focal dextroscoliosis of the thoracic spine, centered at T7-T8. Alignment is otherwise normal. Vertebral body heights are preserved. Note that the upper thoracic spine is poorly assessed in the LATERAL projection due to overlapping structures. There is mild multilevel degenerative disc disease throughout the thoracic spine. See separately dictated lumbar spine for lumbar findings. Visualized lungs are clear.  IMPRESSION: Focal dextroscoliosis of the thoracic spine with mild multilevel degenerative disc disease. No acute abnormality seen.   Electronically Signed By: Norleen Croak M.D. On: 10/16/2024 15:39  Lumbosacral Imaging: Lumbar DG Bending views: Results for orders placed during the hospital encounter of 10/14/24 DG Lumbar Spine Complete W/Bend  Narrative CLINICAL DATA:  Low back pain  EXAM: LUMBAR SPINE - COMPLETE WITH BENDING VIEWS  COMPARISON:  Scout images from CT abdomen dated October 13, 2023  FINDINGS: Five non-rib-bearing lumbar type vertebral bodies. Alignment is normal. Vertebral body heights are preserved. Mild multilevel degenerative disc disease, greatest at L3-L4 and L5-S1. Mild multilevel facet arthropathy, greatest at L4-L5 and L5-S1. No evidence of pars defects on oblique images. Cholecystectomy clips. Bowel gas pattern is nonobstructive. SI joints are symmetric. The sacrum is partially obscured by overlying bowel gas and therefore poorly evaluated.  IMPRESSION: Multilevel degenerative disc disease and facet  arthropathy of the lumbar spine, as above, greatest at L5-S1.   Electronically Signed By: Norleen Croak M.D. On: 10/16/2024 15:37  Ankle Imaging: Ankle-R DG Complete: Results for orders placed during the hospital encounter of 03/03/18 DG Ankle Complete Right  Narrative CLINICAL DATA:  Right foot and ankle pain after fall down 8 stairs. Unable to bear weight. Swelling about dorsal foot/ankle.  EXAM: RIGHT ANKLE - COMPLETE 3+ VIEW  COMPARISON:  None.  FINDINGS: Small acute avulsion fracture from the dorsal talus anteriorly. Associated soft tissue edema. No additional acute fracture. The ankle mortise is preserved. Trace tibial talar spurring. There is a plantar calcaneal spur and Achilles tendon enthesophyte.  IMPRESSION: Small avulsion fracture from the anterior dorsal talus with associated soft tissue edema.   Electronically Signed By: Andrea Bernhardt M.D. On: 03/03/2018 21:37  Foot Imaging: Foot-R DG Complete: Results for orders placed during the hospital encounter of 03/03/18 DG Foot Complete Right  Narrative CLINICAL DATA:  Right  foot and ankle pain after fall down 8 stairs. Unable to bear weight. Swelling about dorsal foot/ankle.  EXAM: RIGHT FOOT COMPLETE - 3+ VIEW  COMPARISON:  None.  FINDINGS: Small acute avulsion fracture from the dorsal anterior talus. No additional acute fracture of the foot. Minimal hammertoe deformity of the digits, alignment is otherwise normal. Prominent plantar calcaneal spur and Achilles tendon enthesophyte.  IMPRESSION: Acute avulsion fracture from the dorsal anterior talus with associated soft tissue edema.   Electronically Signed By: Andrea Bernhardt M.D. On: 03/03/2018 21:39  Complexity Note: Imaging results reviewed.                         Meds   Current Outpatient Medications:    Albuterol -Budesonide  (AIRSUPRA ) 90-80 MCG/ACT AERO, Inhale 2 puffs into the lungs 4 (four) times daily as needed., Disp: 32.1 g,  Rfl: 0   cholecalciferol (VITAMIN D ) 1000 units tablet, Take 1,000 Units by mouth daily., Disp: , Rfl:    Cyanocobalamin  (B-12) 1000 MCG SUBL, Place 1 tablet under the tongue daily., Disp: , Rfl:    estradiol  (VIVELLE -DOT) 0.05 MG/24HR patch, APPLY 1 PATCH TWICE WEEKLY, Disp: 24 patch, Rfl: 0   lipase/protease/amylase (CREON ) 36000 UNITS CPEP capsule, Take 2 capsules (72,000 Units total) by mouth 3 (three) times daily with meals. May also take 1 capsule (36,000 Units total) as needed (with snacks)., Disp: 720 capsule, Rfl: 1   losartan -hydrochlorothiazide  (HYZAAR) 100-12.5 MG tablet, Take 1 tablet by mouth daily., Disp: 90 tablet, Rfl: 1   montelukast  (SINGULAIR ) 10 MG tablet, Take 1 tablet (10 mg total) by mouth at bedtime., Disp: 90 tablet, Rfl: 0   nadolol  (CORGARD ) 20 MG tablet, Take 1 tablet (20 mg total) by mouth daily. In place of Atenolol , Disp: 90 tablet, Rfl: 1   nortriptyline  (PAMELOR ) 50 MG capsule, Take 1 capsule (50 mg total) by mouth at bedtime., Disp: 90 capsule, Rfl: 0   omeprazole  (PRILOSEC) 20 MG capsule, TAKE 1 CAPSULE BY MOUTH EVERY DAY BEFORE BREAKFAST, Disp: 90 capsule, Rfl: 1   rizatriptan  (MAXALT ) 10 MG tablet, Take 1 tablet (10 mg total) by mouth as needed for migraine., Disp: 10 tablet, Rfl: 1   tretinoin (RETIN-A) 0.025 % cream, SMARTSIG:Sparingly Topical Every Other Day, Disp: , Rfl:    venlafaxine  (EFFEXOR ) 75 MG tablet, Take 3 tablets (225 mg total) by mouth daily at 12 noon., Disp: 270 tablet, Rfl: 1   Zavegepant HCl (ZAVZPRET ) 10 MG/ACT SOLN, Place 1 each into the nose daily as needed., Disp: 6 each, Rfl: 1  ROS  Constitutional: Denies any fever or chills Gastrointestinal: No reported hemesis, hematochezia, vomiting, or acute GI distress Musculoskeletal: Denies any acute onset joint swelling, redness, loss of ROM, or weakness Neurological: No reported episodes of acute onset apraxia, aphasia, dysarthria, agnosia, amnesia, paralysis, loss of coordination, or loss of  consciousness  Allergies  [name] is allergic to latex, belviq [lorcaserin  hcl], tomato, vicodin [hydrocodone-acetaminophen ], and imitrex  [sumatriptan ].  PFSH  Drug: Ms. Frigon  reports no history of drug use. Alcohol:  reports current alcohol use. Tobacco:  reports that she has never smoked. She has never used smokeless tobacco. Medical:  has a past medical history of Anxiety, Arthritis, Asthma, Complication of anesthesia, Headache, Hypertension, Liver failure (HCC) (1994), Obesity, Pain in joint involving upper arm, Pancreatitis, Paresthesia of thumb of right hand, PONV (postoperative nausea and vomiting), Vertigo, Vitamin D  deficiency, Vitiligo, and Wears dentures. Surgical: Ms. Theall  has a past surgical history  that includes Knee surgery (Left); Foot surgery; Mandible fracture surgery; Tonsillectomy; Nasal sinus surgery; Trigger finger release; Abdominal exploration surgery; Cholecystectomy; Knee arthroscopy (Left, 1985); Reconstruction mandible / maxilla (1992); Tonsillectomy (1981); Foot surgery (Left, 2007/2009); Nasal sinus surgery (2012); Abdominal hysterectomy (2013); Abdominal hysterectomy (2014); Carpal tunnel release (Right, 12/06/2016); Colonoscopy with propofol  (N/A, 07/19/2021); polypectomy (N/A, 07/19/2021); and Esophagogastroduodenoscopy (egd) with propofol  (N/A, 07/19/2021). Family: family history includes Bipolar disorder in her daughter; Breast cancer in her maternal aunt, maternal aunt, and mother; Cancer in her father, maternal aunt, mother, paternal aunt, and paternal uncle; HIV/AIDS in her mother; Hypertension in her maternal grandfather, maternal grandmother, mother, and sister.  Constitutional Exam  General appearance: Well nourished, well developed, and well hydrated. In no apparent acute distress There were no vitals filed for this visit. BMI Assessment: Estimated body mass index is 35.67 kg/m as calculated from the following:   Height as of 10/14/24:  5' 2 (1.575 m).   Weight as of 10/14/24: 195 lb (88.5 kg).  BMI interpretation table: BMI level Category Range association with higher incidence of chronic pain  <18 kg/m2 Underweight   18.5-24.9 kg/m2 Ideal body weight   25-29.9 kg/m2 Overweight Increased incidence by 20%  30-34.9 kg/m2 Obese (Class I) Increased incidence by 68%  35-39.9 kg/m2 Severe obesity (Class II) Increased incidence by 136%  >40 kg/m2 Extreme obesity (Class III) Increased incidence by 254%   Patient's current BMI Ideal Body weight  There is no height or weight on file to calculate BMI. Patient weight not recorded   BMI Readings from Last 4 Encounters:  10/14/24 35.67 kg/m  09/03/24 34.79 kg/m  05/20/24 34.04 kg/m  02/13/24 34.06 kg/m   Wt Readings from Last 4 Encounters:  10/14/24 195 lb (88.5 kg)  09/03/24 190 lb 3.2 oz (86.3 kg)  05/20/24 186 lb 1.6 oz (84.4 kg)  02/13/24 186 lb 3.2 oz (84.5 kg)    Psych/Mental status: Alert, oriented x 3 (person, place, & time)       Eyes: PERLA Respiratory: No evidence of acute respiratory distress  Assessment & Plan  Primary Diagnosis & Pertinent Problem List: The primary encounter diagnosis was Lumbar facet arthropathy. Diagnoses of Elevated sed rate, Elevated C-reactive protein (CRP), Chronic abdominal pain, Abdominal pain, chronic, right lower quadrant, and Abdominal wall pain in right flank were also pertinent to this visit. Visit Diagnosis: 1. Lumbar facet arthropathy   2. Elevated sed rate   3. Elevated C-reactive protein (CRP)   4. Chronic abdominal pain   5. Abdominal pain, chronic, right lower quadrant   6. Abdominal wall pain in right flank    Problems updated and reviewed during this visit: Problem  Lumbar Facet Arthropathy  Chronic Abdominal Pain  Abdominal Pain, Chronic, Right Lower Quadrant  Abdominal Wall Pain in Right Flank  Elevated Sed Rate  Elevated C-Reactive Protein (Crp)    Plan of Care  Assessment and Plan              Pharmacotherapy (Medications Ordered): No orders of the defined types were placed in this encounter.  Procedure Orders    No procedure(s) ordered today   No orders of the defined types were placed in this encounter.  Lab Orders  No laboratory test(s) ordered today   Imaging Orders  No imaging studies ordered today   Referral Orders  No referral(s) requested today    Pharmacological management:  Opioid Analgesics: I will not be prescribing any opioids at this time Membrane stabilizer: I will not be  prescribing any at this time Muscle relaxant: I will not be prescribing any at this time NSAID: I will not be prescribing any at this time Other analgesic(s): I will not be prescribing any at this time      Interventional Therapies  Risk Factors  Considerations  Medical Comorbidities:  ALLERGY: LATEX, Hydrocodone  Hx. Gastritis  BA  Hx. D & B12 def.  Memory Impairment  HTN  Hx. Liver Failure  (BMI>30)     Planned  Pending:      Under consideration:   Pending   Completed: (Analgesic benefit)1  None at this time   Therapeutic  Palliative (PRN) options:   None established   Completed by other providers:   None reported  1(Analgesic benefit): Expressed in percentage (%). (Local anesthetic[LA] +/- sedation  L.A.Local Anesthetic  Steroid benefit  Ongoing benefit)      Provider-requested follow-up: No follow-ups on file. Recent Visits Date Type Provider Dept  10/14/24 Office Visit Tanya Glisson, MD [facility]  Showing recent visits within past 90 days and meeting all other requirements Future Appointments Date Type Provider Dept  11/11/24 Appointment Tanya Glisson, MD [facility]  Showing future appointments within next 90 days and meeting all other requirements   Primary Care Physician: Sowles, Krichna, MD  Duration of encounter: *** minutes.  Total time on encounter, as per AMA guidelines included both the  face-to-face and non-face-to-face time personally spent by the physician and/or other qualified health care professional(s) on the day of the encounter (includes time in activities that require the physician or other qualified health care professional and does not include time in activities normally performed by clinical staff). Physician's time may include the following activities when performed: Preparing to see the patient (e.g., pre-charting review of records, searching for previously ordered imaging, lab work, and nerve conduction tests) Review of prior analgesic pharmacotherapies. Reviewing PMP Interpreting ordered tests (e.g., lab work, imaging, nerve conduction tests) Performing post-procedure evaluations, including interpretation of diagnostic procedures Obtaining and/or reviewing separately obtained history Performing a medically appropriate examination and/or evaluation Counseling and educating the patient/family/caregiver Ordering medications, tests, or procedures Referring and communicating with other health care professionals (when not separately reported) Documenting clinical information in the electronic or other health record Independently interpreting results (not separately reported) and communicating results to the patient/ family/caregiver Care coordination (not separately reported)  Note by: Glisson DELENA Tanya, MD (TTS technology used. I apologize for any typographical errors that were not detected and corrected.) Date: 11/11/2024; Time: 9:29 AM

## 2024-11-11 ENCOUNTER — Ambulatory Visit: Attending: Pain Medicine | Admitting: Pain Medicine

## 2024-11-11 ENCOUNTER — Encounter: Payer: Self-pay | Admitting: Pain Medicine

## 2024-11-11 VITALS — BP 154/96 | HR 91 | Temp 97.1°F | Resp 16 | Ht 62.0 in | Wt 195.0 lb

## 2024-11-11 DIAGNOSIS — M25541 Pain in joints of right hand: Secondary | ICD-10-CM | POA: Insufficient documentation

## 2024-11-11 DIAGNOSIS — R109 Unspecified abdominal pain: Secondary | ICD-10-CM | POA: Diagnosis not present

## 2024-11-11 DIAGNOSIS — R7689 Other specified abnormal immunological findings in serum: Secondary | ICD-10-CM | POA: Insufficient documentation

## 2024-11-11 DIAGNOSIS — R7309 Other abnormal glucose: Secondary | ICD-10-CM | POA: Diagnosis not present

## 2024-11-11 DIAGNOSIS — G894 Chronic pain syndrome: Secondary | ICD-10-CM | POA: Diagnosis not present

## 2024-11-11 DIAGNOSIS — M47816 Spondylosis without myelopathy or radiculopathy, lumbar region: Secondary | ICD-10-CM | POA: Insufficient documentation

## 2024-11-11 DIAGNOSIS — M5134 Other intervertebral disc degeneration, thoracic region: Secondary | ICD-10-CM | POA: Insufficient documentation

## 2024-11-11 DIAGNOSIS — M5459 Other low back pain: Secondary | ICD-10-CM | POA: Diagnosis not present

## 2024-11-11 DIAGNOSIS — R10A1 Flank pain, right side: Secondary | ICD-10-CM | POA: Diagnosis not present

## 2024-11-11 DIAGNOSIS — M25542 Pain in joints of left hand: Secondary | ICD-10-CM | POA: Diagnosis not present

## 2024-11-11 DIAGNOSIS — R7982 Elevated C-reactive protein (CRP): Secondary | ICD-10-CM | POA: Insufficient documentation

## 2024-11-11 DIAGNOSIS — Z91040 Latex allergy status: Secondary | ICD-10-CM | POA: Diagnosis not present

## 2024-11-11 DIAGNOSIS — R1031 Right lower quadrant pain: Secondary | ICD-10-CM | POA: Insufficient documentation

## 2024-11-11 DIAGNOSIS — M51379 Other intervertebral disc degeneration, lumbosacral region without mention of lumbar back pain or lower extremity pain: Secondary | ICD-10-CM | POA: Diagnosis not present

## 2024-11-11 DIAGNOSIS — M7989 Other specified soft tissue disorders: Secondary | ICD-10-CM | POA: Insufficient documentation

## 2024-11-11 DIAGNOSIS — R700 Elevated erythrocyte sedimentation rate: Secondary | ICD-10-CM | POA: Diagnosis not present

## 2024-11-11 DIAGNOSIS — R7303 Prediabetes: Secondary | ICD-10-CM | POA: Insufficient documentation

## 2024-11-11 DIAGNOSIS — M4184 Other forms of scoliosis, thoracic region: Secondary | ICD-10-CM | POA: Diagnosis not present

## 2024-11-11 DIAGNOSIS — M5137 Other intervertebral disc degeneration, lumbosacral region with discogenic back pain only: Secondary | ICD-10-CM

## 2024-11-11 DIAGNOSIS — G8929 Other chronic pain: Secondary | ICD-10-CM | POA: Insufficient documentation

## 2024-11-11 NOTE — Patient Instructions (Addendum)
 For a more information on how diet can influence inflammation, please visit: [email or web address] NOTE: We are not associated or profit from this web site.    ______________________________________________________________________    General Risks and Possible Complications  Patient Responsibilities: It is important that you read this as it is part of your informed consent. It is our duty to inform you of the risks and possible complications associated with treatments offered to you. It is your responsibility as a patient to read this and to ask questions about anything that is not clear or that you believe was not covered in this document.  Patient's Rights: You have the right to refuse treatment. You also have the right to change your mind, even after initially having agreed to have the treatment done. However, under this last option, if you wait until the last second to change your mind, you may be charged for the materials used up to that point.  Introduction: Medicine is not an visual merchandiser. Everything in Medicine, including the lack of treatment(s), carries the potential for danger, harm, or loss (which is by definition: Risk). In Medicine, a complication is a secondary problem, condition, or disease that can aggravate an already existing one. All treatments carry the risk of possible complications. The fact that a side effects or complications occurs, does not imply that the treatment was conducted incorrectly. It must be clearly understood that these can happen even when everything is done following the highest safety standards.  No treatment: You can choose not to proceed with the proposed treatment alternative. The "PRO(s)" would include: avoiding the risk of complications associated with the therapy. The "CON(s)" would include: not getting any of the treatment benefits. These benefits fall under one of three categories: diagnostic; therapeutic; and/or palliative.  Diagnostic benefits include: getting information which can ultimately lead to improvement of the disease or symptom(s). Therapeutic benefits are those associated with the successful treatment of the disease. Finally, palliative benefits are those related to the decrease of the primary symptoms, without necessarily curing the condition (example: decreasing the pain from a flare-up of a chronic condition, such as incurable terminal cancer).  General Risks and Complications: These are associated to most interventional treatments. They can occur alone, or in combination. They fall under one of the following six (6) categories: no benefit or worsening of symptoms; bleeding; infection; nerve damage; allergic reactions; and/or death. No benefits or worsening of symptoms: In Medicine there are no guarantees, only probabilities. No healthcare provider can ever guarantee that a medical treatment will work, they can only state the probability that it may. Furthermore, there is always the possibility that the condition may worsen, either directly, or indirectly, as a consequence of the treatment. Bleeding: This is more common if the patient is taking a blood thinner, either prescription or over the counter (example: Goody Powders, Fish oil, Aspirin, Garlic, etc.), or if suffering a condition associated with impaired coagulation (example: Hemophilia, cirrhosis of the liver, low platelet counts, etc.). However, even if you do not have one on these, it can still happen. If you have any of these conditions, or take one of these drugs, make sure to notify your treating physician. Infection: This is more common in patients with a compromised immune system, either due to disease (example: diabetes, cancer, human immunodeficiency virus [HIV], etc.), or due to medications or treatments (example: therapies used to treat cancer and rheumatological diseases). However, even if you do not have one on these, it can still happen. If you  have any of these conditions, or take one of these drugs, make sure to notify your treating physician. Nerve Damage: This is more common when the treatment is an invasive one, but it can also happen with the use of medications, such as those used in the treatment of cancer. The damage can occur to small secondary nerves, or to large primary ones, such as those in the spinal cord and brain. This damage may be temporary or permanent and it may lead to impairments that can range from temporary numbness to permanent paralysis and/or brain death. Allergic Reactions: Any time a substance or material comes in contact with our body, there is the possibility of an allergic reaction. These can range from a mild skin rash (contact dermatitis) to a severe systemic reaction (anaphylactic reaction), which can result in death. Death: In general, any medical intervention can result in death, most of the time due to an unforeseen complication. ______________________________________________________________________

## 2024-11-13 NOTE — Telephone Encounter (Signed)
 Requested Prescriptions  Pending Prescriptions Disp Refills   montelukast  (SINGULAIR ) 10 MG tablet [Pharmacy Med Name: MONTELUKAST  10MG  TABLETS] 90 tablet 0    Sig: TAKE 1 TABLET BY MOUTH EVERY DAY AT BEDTIME     Pulmonology:  Leukotriene Inhibitors Passed - 11/13/2024  9:33 AM      Passed - Valid encounter within last 12 months    Recent Outpatient Visits           2 months ago Idiopathic chronic pancreatitis [facility])   [facility] Glenard Mire, MD   5 months ago Idiopathic chronic pancreatitis [facility])   [facility] [facility] Glenard Mire, MD   9 months ago Idiopathic chronic pancreatitis [facility])   [facility] Sowles, Krichna, MD

## 2024-11-26 ENCOUNTER — Other Ambulatory Visit: Payer: Self-pay | Admitting: Family Medicine

## 2024-11-26 DIAGNOSIS — G43109 Migraine with aura, not intractable, without status migrainosus: Secondary | ICD-10-CM

## 2024-11-29 NOTE — Telephone Encounter (Signed)
 Requested Prescriptions  Pending Prescriptions Disp Refills   nortriptyline  (PAMELOR ) 50 MG capsule [Pharmacy Med Name: NORTRIPTYLINE  HCL 50 MG CAP] 90 capsule 0    Sig: TAKE 1 CAPSULE BY MOUTH AT BEDTIME.     Psychiatry:  Antidepressants - Heterocyclics (TCAs) Passed - 11/29/2024 10:30 AM      Passed - Completed PHQ-2 or PHQ-9 in the last 360 days      Passed - Valid encounter within last 6 months    Recent Outpatient Visits           2 months ago Idiopathic chronic pancreatitis [facility])   [facility] Glenard Mire, MD   6 months ago Idiopathic chronic pancreatitis [facility])   [place] [facility] Glenard Mire, MD   9 months ago Idiopathic chronic pancreatitis [facility])   [facility] Sowles, Krichna, MD

## 2024-12-03 ENCOUNTER — Encounter: Payer: Self-pay | Admitting: Family Medicine

## 2024-12-03 ENCOUNTER — Other Ambulatory Visit (HOSPITAL_COMMUNITY): Payer: Self-pay

## 2024-12-03 ENCOUNTER — Ambulatory Visit: Admitting: Family Medicine

## 2024-12-03 ENCOUNTER — Telehealth: Payer: Self-pay | Admitting: Pharmacy Technician

## 2024-12-03 ENCOUNTER — Telehealth (INDEPENDENT_AMBULATORY_CARE_PROVIDER_SITE_OTHER): Admitting: Family Medicine

## 2024-12-03 DIAGNOSIS — G43109 Migraine with aura, not intractable, without status migrainosus: Secondary | ICD-10-CM | POA: Diagnosis not present

## 2024-12-03 DIAGNOSIS — G894 Chronic pain syndrome: Secondary | ICD-10-CM | POA: Diagnosis not present

## 2024-12-03 DIAGNOSIS — K8681 Exocrine pancreatic insufficiency: Secondary | ICD-10-CM

## 2024-12-03 DIAGNOSIS — J4530 Mild persistent asthma, uncomplicated: Secondary | ICD-10-CM | POA: Diagnosis not present

## 2024-12-03 DIAGNOSIS — K861 Other chronic pancreatitis: Secondary | ICD-10-CM | POA: Diagnosis not present

## 2024-12-03 DIAGNOSIS — F331 Major depressive disorder, recurrent, moderate: Secondary | ICD-10-CM | POA: Diagnosis not present

## 2024-12-03 DIAGNOSIS — I10 Essential (primary) hypertension: Secondary | ICD-10-CM | POA: Diagnosis not present

## 2024-12-03 DIAGNOSIS — R7303 Prediabetes: Secondary | ICD-10-CM

## 2024-12-03 MED ORDER — NADOLOL 20 MG PO TABS: 20.0000 mg | ORAL_TABLET | Freq: Every day | ORAL | 1 refills | Status: AC

## 2024-12-03 MED ORDER — MODAFINIL 100 MG PO TABS: 100.0000 mg | ORAL_TABLET | Freq: Every day | ORAL | 0 refills | Status: AC

## 2024-12-03 MED ORDER — UBRELVY 100 MG PO TABS: 1.0000 | ORAL_TABLET | Freq: Every day | ORAL | 2 refills | Status: AC | PRN

## 2024-12-03 NOTE — Telephone Encounter (Signed)
 Pharmacy Patient Advocate Encounter   Received notification from CoverMyMeds that prior authorization for Ubrelvy  100MG  tablets is required/requested.   Insurance verification completed.   The patient is insured through [facility].   Per test claim: PA required; PA started via CoverMyMeds. KEY [id number] . Waiting for clinical questions to populate.

## 2024-12-03 NOTE — Telephone Encounter (Signed)
 Pharmacy Patient Advocate Encounter  Received notification from [facility] that Prior Authorization for Ubrelvy  100MG  tablets has been APPROVED from 12/03/24 to 02/25/25. Ran test claim, Copay is $0.00. This test claim was processed through [facility]- copay amounts may vary at other pharmacies due to pharmacy/plan contracts, or as the patient moves through the different stages of their insurance plan.   PA #/Case ID/Reference #: [id number]

## 2024-12-03 NOTE — Progress Notes (Addendum)
 Name: Tammy Swanson   MRN: [id number]    DOB: 01/28/1966   Date:12/03/2024       Progress Note  Subjective  Chief Complaint  Chief Complaint  Patient presents with   Medical Management of Chronic Issues   Discussed the use of AI scribe software for clinical note transcription with the patient, who gave verbal consent to proceed. I connected with    Tammy Swanson on 12/09/2024 by a video enabled telemedicine application and verified that I am speaking with the correct person using two identifiers.   I discussed the limitations of evaluation and management by telemedicine. The patient expressed understanding and agreed to proceed.  Patient location : at home Provider location: [facility] Patient was alone  History of Present Illness Media A Swanson is a 58 year old female with chronic pain and idiopathic chronic pancreatitis who presents for a three-month follow-up visit.  She experiences chronic pain primarily in the right lower back and left knee. She is under the care of a pain management specialist who noted elevated inflammatory markers with CRP increasing from 12.3 to 18 and sed rate from 31 to 59. Despite referrals, she has not yet seen a rheumatologist. She continues to take meloxicam  for pain, but it has not been effective for her hand pain.  No abdominal pain, nausea, or vomiting. She continues to take Creon  for pancreatic insufficiency.  She has a history of moderate recurrent major depressive disorder, with a PHQ-9 score of 5. She manages stress by staying active and is attempting to schedule an appointment with a psychiatrist. She continues to take Effexor , three pills daily.  She has prediabetes and manages her diet by avoiding high-carb foods, although she notes that pasta and rice are easier on her stomach. She consumes chicken and fish for protein. Her last A1c was 6.2, and she is due for a recheck.  She has persistent asthma, managed with Singulair  and  Airsupra  as needed. She reports occasional wheezing but no significant issues.  Her hypertension is managed with losartan  HCTZ 100/12.5 mg and nadolol  20 mg, which also aids in migraine prevention. No chest pain, palpitations, or shortness of breath.  She takes modafinil  once daily for daytime somnolence related to depression, not sleep apnea. She also takes nortriptyline  for migraine prevention, with a recent prescription sent but not yet picked up.  She experiences migraines and has been using Maxalt , which is not effective. She has previously used Ubrelvy , which she finds effective in managing her migraines.    Patient Active Problem List   Diagnosis Date Noted   DDD (degenerative disc disease), thoracic 11/11/2024   DDD (degenerative disc disease), lumbosacral 11/11/2024   Lumbar facet joint pain 11/11/2024   Dextroscoliosis of thoracic spine 11/11/2024   Elevated hemoglobin A1c 11/11/2024   Latex precautions, history of latex allergy 11/11/2024   ANA positive 11/11/2024   Arthralgia of hands, bilateral 11/11/2024   Bilateral hand swelling 11/11/2024   Lumbar facet arthropathy (Multilevel) (Bilateral) 11/10/2024   Elevated sed rate 11/10/2024   Elevated C-reactive protein (CRP) 11/10/2024   Chronic abdominal pain 11/10/2024   Chronic abdominal pain LQ (Right) 11/10/2024   Abdominal wall & flank pain (Right) 11/10/2024   Chronic low back pain (Right) w/o sciatica 10/14/2024   Chronic thoracic back pain (Right) 10/14/2024   Thoracic radiculitis (Right) 10/14/2024   History of collapse of lung (Right) 10/14/2024    Class: History of   Chronic pain syndrome 10/13/2024   Pharmacologic therapy 10/13/2024  Disorder of skeletal system 10/13/2024   Problems influencing health status 10/13/2024   Small intestinal bacterial overgrowth (SIBO) 09/03/2024   Idiopathic chronic pancreatitis (HCC) 08/29/2023   Abnormal finding on diagnostic imaging of right kidney 11/29/2022   Vitamin D   deficiency 11/29/2022   B12 deficiency 11/29/2022   Post-ERCP acute pancreatitis 10/21/2022   Chronic superficial gastritis without bleeding 08/23/2022   Migraine with aura and without status migrainosus, not intractable 08/23/2022   Moderate episode of recurrent major depressive disorder (HCC) 08/23/2022   Polyp of transverse colon    Headache disorder 10/22/2019   Sleep disorder 10/22/2019   History of carpal tunnel surgery of wrist (Right) 12/06/2016   Prediabetes 10/15/2016   History of liver failure 08/24/2016   Eczema 09/10/2015   Bad memory 06/01/2015   Menopause 06/01/2015   Obesity (BMI 30-39.9) 06/01/2015   Nodule, subcutaneous 06/01/2015   Lesion of ulnar nerve 06/01/2015   Vitiligo 06/01/2015   Essential hypertension 09/03/2010    Past Surgical History:  Procedure Laterality Date   ABDOMINAL EXPLORATION SURGERY     x3   ABDOMINAL HYSTERECTOMY  2013   ABDOMINAL HYSTERECTOMY  2014   CARPAL TUNNEL RELEASE Right 12/06/2016   Procedure: CARPAL TUNNEL RELEASE;  Surgeon: Ozell Flake, MD;  Location: [facility];  Service: Orthopedics;  Laterality: Right;   CHOLECYSTECTOMY     COLONOSCOPY WITH PROPOFOL  N/A 07/19/2021   Procedure: COLONOSCOPY WITH BIOPSY;  Surgeon: Jinny Carmine, MD;  Location: [facility];  Service: Endoscopy;  Laterality: N/A;   ESOPHAGOGASTRODUODENOSCOPY (EGD) WITH PROPOFOL  N/A 07/19/2021   Procedure: ESOPHAGOGASTRODUODENOSCOPY (EGD) WITH BIOPSY;  Surgeon: Jinny Carmine, MD;  Location: [facility];  Service: Endoscopy;  Laterality: N/A;  Latex   FOOT SURGERY     FOOT SURGERY Left 2007/2009   Dr. Lilli   KNEE ARTHROSCOPY Left 1985   KNEE SURGERY Left    MANDIBLE FRACTURE SURGERY     NASAL SINUS SURGERY     NASAL SINUS SURGERY  2012   POLYPECTOMY N/A 07/19/2021   Procedure: POLYPECTOMY;  Surgeon: Jinny Carmine, MD;  Location: [facility];  Service: Endoscopy;  Laterality: N/A;   RECONSTRUCTION MANDIBLE / MAXILLA  1992   TONSILLECTOMY      TONSILLECTOMY  1981   TRIGGER FINGER RELEASE      Family History  Problem Relation Age of Onset   Hypertension Mother    Cancer Mother        Breast, colon   HIV/AIDS Mother    Breast cancer Mother    Cancer Father        lung cancer   Hypertension Sister    Hypertension Maternal Grandmother    Hypertension Maternal Grandfather    Bipolar disorder Daughter    Cancer Maternal Aunt        3 sisters with Breast Cancer   Breast cancer Maternal Aunt    Breast cancer Maternal Aunt    Cancer Paternal Aunt    Cancer Paternal Uncle     Social History   Tobacco Use   Smoking status: Never   Smokeless tobacco: Never  Substance Use Topics   Alcohol use: Yes    Comment: occasionally    Current Medications[1]  Allergies[2]  I personally reviewed active problem list, medication list, allergies, family history with the patient/caregiver today.   ROS  Ten systems reviewed and is negative except as mentioned in HPI    Objective Physical Exam  Awake alert and oriented      Recent  Results (from the past 2160 hours)  Comp. Metabolic Panel (12)     Status: None   Collection Time: 10/14/24 11:03 AM  Result Value Ref Range   Glucose 92 70 - 99 mg/dL   BUN 14 8 - 27 mg/dL   Creatinine, Ser 9.17 0.57 - 1.00 mg/dL   eGFR 82 >40 fO/fpw/8.26   BUN/Creatinine Ratio 17 12 - 28   Sodium 141 134 - 144 mmol/L   Potassium 4.4 3.5 - 5.2 mmol/L   Chloride 101 96 - 106 mmol/L   Calcium 10.3 8.7 - 10.3 mg/dL   Total Protein 7.8 6.0 - 8.5 g/dL   Albumin 4.3 3.8 - 4.9 g/dL   Globulin, Total 3.5 1.5 - 4.5 g/dL   Bilirubin Total 0.3 0.0 - 1.2 mg/dL   Alkaline Phosphatase 131 49 - 135 IU/L   AST 21 0 - 40 IU/L  Magnesium     Status: None   Collection Time: 10/14/24 11:03 AM  Result Value Ref Range   Magnesium 1.9 1.6 - 2.3 mg/dL  Vitamin B12     Status: None   Collection Time: 10/14/24 11:03 AM  Result Value Ref Range   Vitamin B-12 945 232 - 1,245 pg/mL  Sedimentation rate      Status: Abnormal   Collection Time: 10/14/24 11:03 AM  Result Value Ref Range   Sed Rate 59 (H) 0 - 40 mm/hr  25-Hydroxy vitamin D  Lcms D2+D3     Status: None   Collection Time: 10/14/24 11:03 AM  Result Value Ref Range   25-Hydroxy, Vitamin D  42 ng/mL    Comment: Reference Range: All Ages: Target levels 30 - 100    25-Hydroxy, Vitamin D -2 <1.0 ng/mL    Comment: This test was developed and its performance characteristics determined by [facility]. It has not been cleared or approved by the Food and Drug Administration.     25-Hydroxy, Vitamin D -3 42 ng/mL    Comment: This test was developed and its performance characteristics determined by [facility]. It has not been cleared or approved by the Food and Drug Administration.    C-reactive protein     Status: Abnormal   Collection Time: 10/14/24 11:03 AM  Result Value Ref Range   CRP 18 (H) 0 - 10 mg/L  Compliance Drug Analysis, Ur     Status: None   Collection Time: 10/14/24  1:53 PM  Result Value Ref Range   Summary FINAL     Comment: ==================================================================== Compliance Drug Analysis, Ur ==================================================================== Test                             Result       Flag       Units  Drug Present and Declared for Prescription Verification   Nortriptyline                   PRESENT      EXPECTED    Nortriptyline  may be administered as a prescription drug; it is also    an expected metabolite of amitriptyline .    Venlafaxine                     PRESENT      EXPECTED   Desmethylvenlafaxine           PRESENT      EXPECTED    Desmethylvenlafaxine is an expected metabolite of venlafaxine .  ==================================================================== Test  Result    Flag   Units      Ref Range   Creatinine              186              mg/dL      >=79 ==================================================================== Declared  Medications:  The flagging and interpretation on this report are based on t he  following declared medications.  Unexpected results may arise from  inaccuracies in the declared medications.   **Note: The testing scope of this panel includes these medications:   Nortriptyline  (Pamelor )  Venlafaxine  (Effexor )   **Note: The testing scope of this panel does not include the  following reported medications:   Albuterol  (Airsupra )  Budesonide  (Airsupra )  Estradiol   Hydrochlorothiazide  (Hyzaar)  Losartan  (Hyzaar)  Montelukast  (Singulair )  Nadolol  (Corgard )  Omeprazole  (Prilosec)  Pancrelipase  (Creon )  Rizatriptan  (Maxalt )  Tretinoin (Retin-A)  Vitamin B12  Vitamin D   Zavegepant (Zavzpret ) ==================================================================== For clinical consultation, please call [phone]. ====================================================================      PHQ2/9:    12/03/2024    8:36 AM 11/11/2024    9:00 AM 10/14/2024    9:05 AM 09/03/2024    9:45 AM 05/20/2024    2:33 PM  Depression screen PHQ 2/9  Decreased Interest 1 0 1 1 0  Down, Depressed, Hopeless 1 1 1 1  0  PHQ - 2 Score 2 1 2 2  0  Altered sleeping 1   1 0  Tired, decreased energy 1   1 0  Change in appetite 0   0 0  Feeling bad or failure about yourself  0   0 0  Trouble concentrating 1   0 0  Moving slowly or fidgety/restless 0   0 0  Suicidal thoughts 0   0 0  PHQ-9 Score 5   4  0   Difficult doing work/chores Somewhat difficult   Somewhat difficult Not difficult at all     Data saved with a previous flowsheet row definition    phq 9 is positive  Fall Risk:    12/03/2024    8:36 AM 11/11/2024    9:00 AM 10/14/2024    9:05 AM 09/03/2024    9:41 AM 05/20/2024    2:29 PM  Fall Risk   Falls in the past year? 0 0 0 0 0  Number falls in past yr: 0   0 0  Injury with Fall? 0   0  0   Risk for fall due to : No Fall Risks   No Fall Risks No Fall Risks  Follow up Falls evaluation  completed   Falls evaluation completed Falls prevention discussed;Education provided;Falls evaluation completed     Data saved with a previous flowsheet row definition    Assessment & Plan Chronic pain syndrome Chronic pain in right lower back, left knee, and hands with elevated inflammatory markers suggesting rheumatological involvement. Meloxicam  not fully effective for hand pain. - Dr. Meriel placed referral to Rheumatologist , I gave the patient Dr. Anthony number, appointment pending - Continue meloxicam  for pain management.  Idiopathic chronic pancreatitis with pancreatic insufficiency Idiopathic chronic pancreatitis well-controlled with Creon . No current symptoms of abdominal pain, nausea, or vomiting. - Continue Creon  for pancreatic insufficiency.  Moderate recurrent major depressive disorder Moderate recurrent major depressive disorder with PHQ-9 score of 5, indicating improvement. Managed with Effexor  and modafinil  for daytime somnolence. - Continue Effexor  for depression management. - Prescribed modafinil  for daytime somnolence. - Encouraged follow-up with psychiatrist Dr.  Simon.  Migraine with aura Migraine with aura managed with Maxalt , which is ineffective. Ubrelvy  is covered by insurance and effective. - Prescribed Ubrelvy  for migraine management. - Advised to try Ubrelvy  before Maxalt  if effective since Maxalt  has not been working  Mild persistent asthma Mild persistent asthma well-controlled with current medications. Occasional wheezing, shortness of breath, or cough. - Continue Singulair  and Airsupra  as needed.  Essential hypertension Essential hypertension well-controlled with losartan , HCTZ, and nadolol . - Continue current antihypertensive regimen.  Prediabetes Previous A1c of 6.2. Dietary management challenging due to carbohydrate intolerance. - Will check A1c during next blood work. - Advised to monitor blood sugar levels.   Duration of video call 25  minutes      [1]  Current Outpatient Medications:    Albuterol -Budesonide  (AIRSUPRA ) 90-80 MCG/ACT AERO, Inhale 2 puffs into the lungs 4 (four) times daily as needed., Disp: 32.1 g, Rfl: 0   cholecalciferol (VITAMIN D ) 1000 units tablet, Take 1,000 Units by mouth daily., Disp: , Rfl:    Cyanocobalamin  (B-12) 1000 MCG SUBL, Place 1 tablet under the tongue daily., Disp: , Rfl:    estradiol  (VIVELLE -DOT) 0.05 MG/24HR patch, APPLY 1 PATCH TWICE WEEKLY, Disp: 24 patch, Rfl: 0   lipase/protease/amylase (CREON ) 36000 UNITS CPEP capsule, Take 2 capsules (72,000 Units total) by mouth 3 (three) times daily with meals. May also take 1 capsule (36,000 Units total) as needed (with snacks)., Disp: 720 capsule, Rfl: 1   losartan -hydrochlorothiazide  (HYZAAR) 100-12.5 MG tablet, Take 1 tablet by mouth daily., Disp: 90 tablet, Rfl: 1   meloxicam  (MOBIC ) 15 MG tablet, Take 15 mg by mouth., Disp: , Rfl:    modafinil  (PROVIGIL ) 100 MG tablet, Take 100 mg by mouth., Disp: , Rfl:    montelukast  (SINGULAIR ) 10 MG tablet, TAKE 1 TABLET BY MOUTH EVERY DAY AT BEDTIME, Disp: 90 tablet, Rfl: 0   nadolol  (CORGARD ) 20 MG tablet, Take 1 tablet (20 mg total) by mouth daily. In place of Atenolol , Disp: 90 tablet, Rfl: 1   nortriptyline  (PAMELOR ) 50 MG capsule, TAKE 1 CAPSULE BY MOUTH AT BEDTIME., Disp: 90 capsule, Rfl: 0   omeprazole  (PRILOSEC) 20 MG capsule, TAKE 1 CAPSULE BY MOUTH EVERY DAY BEFORE BREAKFAST, Disp: 90 capsule, Rfl: 1   rizatriptan  (MAXALT ) 10 MG tablet, Take 1 tablet (10 mg total) by mouth as needed for migraine., Disp: 10 tablet, Rfl: 1   tretinoin (RETIN-A) 0.025 % cream, SMARTSIG:Sparingly Topical Every Other Day, Disp: , Rfl:    venlafaxine  (EFFEXOR ) 75 MG tablet, Take 3 tablets (225 mg total) by mouth daily at 12 noon., Disp: 270 tablet, Rfl: 1 [2]  Allergies Allergen Reactions   Latex Anaphylaxis   Belviq [Lorcaserin  Hcl]     Dry mouth, palpitation   Tomato Hives    Cherry tomatoes   Vicodin  [Hydrocodone-Acetaminophen ] Hives   Imitrex  [Sumatriptan ] Palpitations    Intolerance

## 2024-12-23 ENCOUNTER — Encounter: Payer: Self-pay | Admitting: Family Medicine

## 2024-12-24 ENCOUNTER — Other Ambulatory Visit: Payer: Self-pay | Admitting: Family Medicine

## 2024-12-24 DIAGNOSIS — Z1231 Encounter for screening mammogram for malignant neoplasm of breast: Secondary | ICD-10-CM

## 2024-12-26 ENCOUNTER — Ambulatory Visit
Admission: RE | Admit: 2024-12-26 | Discharge: 2024-12-26 | Disposition: A | Discharge status: Home / Self Care | Source: Ambulatory Visit | Attending: Family Medicine | Admitting: Family Medicine

## 2024-12-26 DIAGNOSIS — Z1231 Encounter for screening mammogram for malignant neoplasm of breast: Secondary | ICD-10-CM | POA: Insufficient documentation

## 2025-01-02 ENCOUNTER — Other Ambulatory Visit: Payer: Self-pay | Admitting: *Deleted

## 2025-01-02 ENCOUNTER — Encounter: Payer: Self-pay | Admitting: *Deleted

## 2025-01-02 ENCOUNTER — Inpatient Hospital Stay
Admission: RE | Admit: 2025-01-02 | Discharge: 2025-01-02 | Disposition: A | Discharge status: Home / Self Care | Payer: Self-pay | Source: Ambulatory Visit | Attending: Family Medicine | Admitting: Family Medicine

## 2025-01-02 DIAGNOSIS — Z1231 Encounter for screening mammogram for malignant neoplasm of breast: Secondary | ICD-10-CM
